# Patient Record
Sex: Female | Born: 1990 | Race: Black or African American | Hispanic: No | Marital: Single | Smoking: Former smoker
Health system: Southern US, Community
[De-identification: ages and names within clinical notes are randomized; demographics above are authoritative.]

## PROBLEM LIST (undated history)

## (undated) ENCOUNTER — Inpatient Hospital Stay (HOSPITAL_COMMUNITY): Payer: Self-pay

## (undated) DIAGNOSIS — F329 Major depressive disorder, single episode, unspecified: Secondary | ICD-10-CM

## (undated) DIAGNOSIS — O24419 Gestational diabetes mellitus in pregnancy, unspecified control: Secondary | ICD-10-CM

## (undated) DIAGNOSIS — D219 Benign neoplasm of connective and other soft tissue, unspecified: Secondary | ICD-10-CM

## (undated) DIAGNOSIS — F32A Depression, unspecified: Secondary | ICD-10-CM

## (undated) DIAGNOSIS — L0291 Cutaneous abscess, unspecified: Secondary | ICD-10-CM

## (undated) DIAGNOSIS — D509 Iron deficiency anemia, unspecified: Secondary | ICD-10-CM

## (undated) DIAGNOSIS — Z62819 Personal history of unspecified abuse in childhood: Secondary | ICD-10-CM

## (undated) DIAGNOSIS — L732 Hidradenitis suppurativa: Secondary | ICD-10-CM

## (undated) DIAGNOSIS — O139 Gestational [pregnancy-induced] hypertension without significant proteinuria, unspecified trimester: Secondary | ICD-10-CM

## (undated) DIAGNOSIS — E669 Obesity, unspecified: Secondary | ICD-10-CM

## (undated) HISTORY — DX: Personal history of unspecified abuse in childhood: Z62.819

## (undated) HISTORY — DX: Gestational diabetes mellitus in pregnancy, unspecified control: O24.419

## (undated) HISTORY — PX: THERAPEUTIC ABORTION: SHX798

## (undated) HISTORY — DX: Iron deficiency anemia, unspecified: D50.9

## (undated) HISTORY — DX: Depression, unspecified: F32.A

## (undated) HISTORY — DX: Obesity, unspecified: E66.9

## (undated) HISTORY — DX: Hidradenitis suppurativa: L73.2

## (undated) HISTORY — PX: INCISE AND DRAIN ABCESS: PRO64

---
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
## 2009-01-19 ENCOUNTER — Encounter: Payer: Self-pay | Admitting: *Deleted

## 2009-01-22 ENCOUNTER — Ambulatory Visit: Payer: Self-pay | Admitting: Family Medicine

## 2009-02-01 ENCOUNTER — Ambulatory Visit: Payer: Self-pay | Admitting: Family Medicine

## 2009-02-01 ENCOUNTER — Other Ambulatory Visit: Admission: RE | Admit: 2009-02-01 | Discharge: 2009-02-01 | Payer: Self-pay | Admitting: Family Medicine

## 2009-02-01 ENCOUNTER — Encounter: Payer: Self-pay | Admitting: Family Medicine

## 2009-02-06 ENCOUNTER — Encounter: Payer: Self-pay | Admitting: Family Medicine

## 2009-02-06 ENCOUNTER — Telehealth: Payer: Self-pay | Admitting: Family Medicine

## 2009-02-28 ENCOUNTER — Encounter: Payer: Self-pay | Admitting: Family Medicine

## 2009-03-01 ENCOUNTER — Telehealth: Payer: Self-pay | Admitting: Family Medicine

## 2009-03-06 ENCOUNTER — Emergency Department (HOSPITAL_COMMUNITY): Admission: EM | Admit: 2009-03-06 | Discharge: 2009-03-06 | Payer: Self-pay | Admitting: Emergency Medicine

## 2009-03-12 ENCOUNTER — Encounter: Payer: Self-pay | Admitting: Family Medicine

## 2009-03-21 ENCOUNTER — Ambulatory Visit: Payer: Self-pay | Admitting: Family Medicine

## 2009-03-29 ENCOUNTER — Telehealth: Payer: Self-pay | Admitting: Family Medicine

## 2009-04-04 ENCOUNTER — Encounter: Payer: Self-pay | Admitting: Family Medicine

## 2009-04-23 ENCOUNTER — Ambulatory Visit: Payer: Self-pay | Admitting: Family Medicine

## 2009-04-23 LAB — CONVERTED CEMR LAB: Beta hcg, urine, semiquantitative: NEGATIVE

## 2009-05-09 ENCOUNTER — Ambulatory Visit: Payer: Self-pay | Admitting: Family Medicine

## 2009-05-16 ENCOUNTER — Ambulatory Visit: Payer: Self-pay | Admitting: Family Medicine

## 2009-05-18 ENCOUNTER — Ambulatory Visit: Payer: Self-pay | Admitting: Family Medicine

## 2009-07-02 ENCOUNTER — Telehealth: Payer: Self-pay | Admitting: Family Medicine

## 2009-07-23 ENCOUNTER — Ambulatory Visit: Payer: Self-pay | Admitting: Family Medicine

## 2009-07-23 DIAGNOSIS — R87610 Atypical squamous cells of undetermined significance on cytologic smear of cervix (ASC-US): Secondary | ICD-10-CM | POA: Insufficient documentation

## 2009-07-23 LAB — CONVERTED CEMR LAB: Hemoglobin: 11.4 g/dL

## 2009-08-29 ENCOUNTER — Encounter: Payer: Self-pay | Admitting: Family Medicine

## 2009-08-29 ENCOUNTER — Ambulatory Visit: Payer: Self-pay | Admitting: Family Medicine

## 2009-08-29 LAB — CONVERTED CEMR LAB: Beta hcg, urine, semiquantitative: NEGATIVE

## 2009-11-05 ENCOUNTER — Telehealth (INDEPENDENT_AMBULATORY_CARE_PROVIDER_SITE_OTHER): Payer: Self-pay | Admitting: *Deleted

## 2009-11-21 ENCOUNTER — Encounter: Payer: Self-pay | Admitting: Family Medicine

## 2009-12-19 ENCOUNTER — Encounter: Payer: Self-pay | Admitting: *Deleted

## 2009-12-19 ENCOUNTER — Ambulatory Visit: Payer: Self-pay | Admitting: Family Medicine

## 2009-12-19 LAB — CONVERTED CEMR LAB: Beta hcg, urine, semiquantitative: NEGATIVE

## 2010-02-14 NOTE — Letter (Signed)
Summary: 1st No Show while on Suspension Letter  [facility]  [address]   [place], [place] [address]   Phone: [phone]  Fax: [phone]    03/12/2009  LEKEYA ROLLINGS [address] [place], [place]  [address]  Dear Ms. Bourque,  You have missed another appointment with our office.  As is our policy if you miss one more  appointment in the next year you will be dismissed from our practice.  We hope this does not happen.  Our office staff can be reached at [phone] Monday through Friday from 8:30 a.m.-5:00 p.m. and will be glad to schedule your appointment as necessary.  Sincerely,   [facility]     Appended Document: 1st No Show while on Suspension Letter mailed certified.  Appended Document: 1st No Show while on Suspension Letter read letter to pt - stated that if she misses one more that she would be dismissed has an exam today and cannot miss it - spoke with KF and she said we could resch since she just found out about exam.  reiterated the letter and made appt for 4/11

## 2010-02-14 NOTE — Assessment & Plan Note (Signed)
Summary: tb test & flu shot,df  Nurse Visit   Vital Signs:  Patient profile:   20 year old female Temp:     67 degrees F  Vitals Entered By: Theresia Lo RN (May 09, 2009 10:34 AM)  Allergies: No Known Drug Allergies  Immunizations Administered:  Influenza Vaccine # 1:    Vaccine Type: Fluvax Non-MCR    Site: right deltoid    Mfr: Sanofi Pasteur    Dose: 0.5 ml    Route: IM    Given by: Theresia Lo RN    Exp. Date: 07/12/2009    Lot #: [id number]    VIS given: 08/06/06 version given May 09, 2009.  Flu Vaccine Consent Questions:    Do you have a history of severe allergic reactions to this vaccine? no    Any prior history of allergic reactions to egg and/or gelatin? no    Do you have a sensitivity to the preservative Thimersol? no    Do you have a past history of Guillan-Barre Syndrome? no    Do you currently have an acute febrile illness? no    Have you ever had a severe reaction to latex? no    Vaccine information given and explained to patient? yes    Are you currently pregnant? no  Orders Added: 1)  Influenza Vaccine NON MCR [00028] 2)  Admin 1st Vaccine [facility]) [90471S]     Appended Document: tb test & flu shot,df   PPD Application    Vaccine Type: PPD    Site: left forearm    Mfr: Sanofi Pasteur    Dose: 0.1 ml    Route: ID    Given by: Theresia Lo RN    Exp. Date: 10/25/2009    Lot #: [id number] patient did not return for PPD reading after applied on 04/23/2009. reapplied today.  Theresia Lo RN  May 09, 2009 10:40 AM

## 2010-02-14 NOTE — Progress Notes (Signed)
  Phone Note Outgoing Call   Call placed by: Angeline Slim MD,  February 06, 2009 9:21 AM Call placed to: Patient Summary of Call: Precepted with Dr Swaziland.  Want to discuss result of pap.  Tried calling pt but phone disconnected with no new phone #.  Tried looking back in chart for any other numbers but this the only number I've ever used to call pt.  Will send letter.   Initial call taken by: Lonnie Rosado MD,  February 06, 2009 9:24 AM

## 2010-02-14 NOTE — Progress Notes (Signed)
Summary: triage  Phone Note Call from Patient Call back at [phone]   Caller: Patient Summary of Call: Pt wondering if she has to be seen to get the morning after pill? Initial call taken by: Clydell Hakim,  March 29, 2009 11:39 AM  Follow-up for Phone Call        lm that she can buy it at drugstore. to call back if any questions Follow-up by: Golden Circle RN,  March 29, 2009 11:54 AM

## 2010-02-14 NOTE — Assessment & Plan Note (Signed)
Summary: Physical for employment   Vital Signs:  Patient profile:   20 year old female Height:      61.6 inches Weight:      238.06 pounds BMI:     44.27 Temp:     98.3 degrees F oral Pulse rate:   84 / minute BP sitting:   123 / 81  (left arm)  Vitals Entered By: Terese Door (March 21, 2009 4:09 PM)  Nutrition Counseling: Patient's BMI is greater than 25 and therefore counseled on weight management options. CC: Physical for employment Is Patient Diabetic? No Pain Assessment Patient in pain? no       Vision Screening:Left eye w/o correction: 20 / 25 Right Eye w/o correction: 20 / 60 Both eyes w/o correction:  20/ 25        Vision Entered By: Terese Door (March 21, 2009 4:18 PM)  Hearing Screen  20db HL: Left  500 hz: 20db 1000 hz: 20db 2000 hz: 20db 4000 hz: 20db Right  500 hz: 20db 1000 hz: 20db 2000 hz: 20db 4000 hz: 20db   Hearing Testing Entered By: Terese Door (March 21, 2009 4:18 PM)   Primary Care Provider:  Angeline Slim MD  CC:  Physical for employment.  History of Present Illness: 20 y/o G2P2 is here for cpe for her work.   Pt denies any sick symptoms.  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    Menses: once a month.  On Depo, but over due for next shot.  Last sexual intercourse last night.  Needs IUD or Implanon.   Habits & Providers  Alcohol-Tobacco-Diet     Alcohol drinks/day: 0     Tobacco Status: never     Diet Comments: Needs to watch portion     Diet Counseling: Nutrition referral  Exercise-Depression-Behavior     Does Patient Exercise: no     Exercise Counseling: to improve exercise regimen     Have you felt down or hopeless? no     Have you felt little pleasure in things? no     Depression Counseling: not indicated; screening negative for depression     STD Risk: past     STD Risk  Counseling: to avoid increased STD risk     Drug Use: past     Drug Use Counseling: pt stopped smoking THC in 2007 when she became pregnant     Seat Belt Use: always     Sun Exposure: infrequent  Current Medications (verified): 1)  None  Allergies (verified): No Known Drug Allergies  Past History:  Family History: Last updated: 03/21/2009 -Brother with eczema -Mother with migraines, early onset DJD, HTN, heart D, heart murmur -Father murdered 6/01--very painful subject for pt,, has a lot of anger but not willing to talk to  Social History: Last updated: 03/21/2009 Father murdered 6/01 Admits to marijuana use in past (last use April 2007).  Has two daughters, J'serie  Corine Shelter born 05/11/07.  Daughter Silas Flood born 10/15/08. Lives with boyfriend Pilar Jarvis of her daughters). +Tobacco on and off.  Quit in 01/2009.  Used to smoke 1 pack/3-4 days.  Started smoking age 80. No alcohol.  No drugs.  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   Risk Factors: Alcohol Use: 0 (03/21/2009) Diet: Needs  to watch portion (03/21/2009) Exercise: no (03/21/2009)  Risk Factors: Smoking Status: never (03/21/2009) Packs/Day: n/a (06/01/2008)  Past Medical History: Reviewed history from 02/01/2009 and no changes required. 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 -Brother with eczema -Mother with migraines, early onset DJD, HTN, heart D, heart murmur -Father murdered 6/01--very painful subject for pt,, has a lot of anger but not willing to talk to  Social History: Father murdered 6/01 Admits to marijuana  use in past (last use April 2007).  Has two daughters, J'serie  Corine Shelter born 05/11/07.  Daughter Silas Flood born 10/15/08. Lives with boyfriend Pilar Jarvis of her daughters). +Tobacco on and off.  Quit in 01/2009.  Used to smoke 1 pack/3-4 days.  Started smoking age 59. No alcohol.  No drugs.  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 Smoking Status:  never STD Risk:  past Drug Use:  past Seat Belt Use:  always Sun Exposure-Excessive:  infrequent Does Patient Exercise:  no  Review of Systems  The patient denies anorexia, fever, weight loss, vision loss, decreased hearing, chest pain, syncope, prolonged cough, headaches, hemoptysis, abdominal pain, melena, severe indigestion/heartburn, hematuria, incontinence, suspicious skin lesions, difficulty walking, and depression.    Physical Exam  General:  Well-developed,well-nourished,in no acute distress; alert,appropriate and cooperative throughout examination Head:  Normocephalic and atraumatic without obvious abnormalities. No apparent alopecia or balding. Eyes:  No corneal or conjunctival inflammation noted. EOMI. Perrla. Funduscopic exam benign, without hemorrhages, exudates or papilledema. Vision grossly normal. Mouth:  Oral mucosa and oropharynx without lesions or exudates.  Teeth in good repair. Neck:  supple, full ROM, and no masses.   Lungs:  Normal respiratory effort, chest expands symmetrically. Lungs are clear to auscultation, no crackles or wheezes. Heart:  Normal rate and regular rhythm. S1 and S2 normal without gallop, murmur, click, rub or other extra sounds. Abdomen:  Bowel sounds positive,abdomen soft and non-tender without masses, organomegaly or hernias noted. obese. Msk:  No deformity or scoliosis noted of thoracic or lumbar spine.   Pulses:  R and L radial,dorsalis pedispulses are full and equal bilaterally Extremities:  No clubbing, cyanosis, edema, or deformity noted with  normal full range of motion of all joints.   Neurologic:  No cranial nerve deficits noted. Station and gait are normal. Plantar reflexes are down-going bilaterally. DTRs are symmetrical throughout. Sensory, motor and coordinative functions appear intact. Skin:  Intact without suspicious lesions or rashes Cervical Nodes:  No lymphadenopathy noted Psych:  Cognition and judgment appear intact. Alert and cooperative with normal attention span and concentration. No apparent delusions, illusions, hallucinations   Impression & Recommendations:  Problem # 1:  PHYSICAL EXAMINATION (ICD-V70.0) Assessment Unchanged Pt healthy.  Filled out PE form for work place.  PPD placed.  Pt to rct in 48 hrs to have it read.   [phone][facility] Hearing- [facility] (92551) [facility] - Est  18-39 yrs [phone])  Problem # 2:  CONTRACEPTIVE MANAGEMENT (ICD-V25.09) Assessment: Unchanged  Pt received Depo at time of discharge from [facility], 5 months ago.  She did not get another depo shot since she want an IUD.  She needs appt asap for IUD or Implanon.    Orders: [facility] - Est  18-39 yrs ([address])  Problem # 3:  OBESITY, CLASS III (ICD-278.01) Assessment: Unchanged  Referred pt to Nutrition. Pt to make appt with Dr Gerilyn Pilgrim.   Orders: [facility] - Est  18-39 yrs [phone])  Other Orders: Tdap => 47yrs IM (60454) Admin 1st Vaccine (09811) TB Skin Test [phone]) Admin of Any Addtl Vaccine (29562) Hepatitis B Vaccine >18yrs ([address])  Patient Instructions: 1)  Please schedule a follow-up appointment with Dr Janalyn Harder for contraception.  2)  Please schedule a follow-up appointment with Dr Gerilyn Pilgrim for nutrition.    Prevention & Chronic Care Immunizations   Influenza vaccine: given  (03/28/2008)   Influenza vaccine due: 03/28/2009    Tetanus booster: 03/21/2009: Tdap   Tetanus booster due: Not Indicated    Pneumococcal vaccine: Not documented  Other Screening   Pap smear: ATYPICAL SQUAMOUS CELLS OF UNDETERMINED SIGNIFICANCE (ASC-US).   (02/01/2009)   Pap smear due: 08/01/2009   Smoking status: never  (03/21/2009)     Immunizations Administered:  Tetanus Vaccine:    Vaccine Type: Tdap    Site: right deltoid    Mfr: GlaxoSmithKline    Dose: 0.5 ml    Route: IM    Given by: Tessie Fass CMA    Exp. Date: 04/07/2011    Lot #: [id number]    VIS given: 12/01/06 version given March 21, 2009.  PPD Skin Test:    Vaccine Type: PPD    Site: left forearm    Mfr: Sanofi Pasteur    Dose: 0.1 ml    Route: ID    Given by: Tessie Fass CMA    Exp. Date: 06/10/2011    Lot #: [id number]  Hepatitis B Vaccine # 1:    Vaccine Type: HepB Adult    Site: left deltoid    Mfr: GlaxoSmithKline    Dose: 1.0 ml    Route: IM    Given by: Tessie Fass CMA    Exp. Date: 05/10/2010    Lot #: [id number]    VIS given: 07/30/05 version given March 21, 2009.

## 2010-02-14 NOTE — Progress Notes (Signed)
Summary: triage  Phone Note Call from Patient Call back at [phone]   Caller: Patient Summary of Call: Pt wondering what the symptoms of food poisioning are. Initial call taken by: Clydell Hakim,  March 01, 2009 3:27 PM  Follow-up for Phone Call        told her n & V, diarrhea. states he boyfriend has nausea only. he is not a pt here. told her there is a GI virus going around. told her if he felt nad he could see his md or go to UC. she agreed Follow-up by: Golden Circle RN,  March 01, 2009 3:35 PM

## 2010-02-14 NOTE — Assessment & Plan Note (Signed)
Summary: FU/KH   Vital Signs:  Patient profile:   20 year old female Height:      62 inches Weight:      232 pounds BMI:     42.59 Temp:     98.3 degrees F oral Pulse rate:   89 / minute BP sitting:   130 / 87  (right arm) Cuff size:   large  Vitals Entered By: Tessie Fass, CMA CC: pap Is Patient Diabetic? No Pain Assessment Patient in pain? yes     Location: under right arm Intensity: 10   Primary Care Provider:  Luretha Murphy NP  CC:  pap.  History of Present Illness: 20 y/o G2P2 presents for postpartum exam.  Not started menses yet.  Just started seeing spotting recently.  Received Depo injection on day of discharge from [facility].   Pap in 03/2008 showed LSIL.  No previous abnormal pap.  Would like to get IUD at next visit. No vaginal discharge. Denies SI/HI.  Habits & Providers  Alcohol-Tobacco-Diet     Tobacco Status: quit  Current Medications (verified): 1)  Gnp Prenatal Vitamins  Tabs (Prenatal Vit-Fe Fumarate-Fa) .... 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  Has a daughter, Nevada Crane born 05/11/07.  Daughter Ladene Artist born 10/15/08 Lives with boyfriend (father of her daughters). Works at call center.Smoking Status:  quit  Physical Exam  General:  Well-developed,well-nourished,in no acute distress; alert,appropriate and cooperative throughout  examination. vitals reviewed.    Pelvic Exam  Vulva:      normal appearance, normal hair distribution, no lesions or masses.   Vagina:      normal, ruggated, physiologic discharge, no lesions, no masses, no cystocele, adequate pelvic support.   Cervix:      normal, midposition, no CMT, no lesions.   Uterus:      smooth, anteverted, anteflexed, mobile, non-tender, firm, adequate support, no prolapse.   Adnexa:      normal, no masses bilaterally, mobile bilaterally, nontender bilaterally.      Impression & Recommendations:  Problem # 1:  SCREENING FOR MALIGNANT NEOPLASM OF THE CERVIX (ICD-V76.2) Assessment New Pap in 03/2008 with LSIL, no previous history of abnormal pap.  If another abnormal result will need culpo.   Orders: Pap Smear-[facility] ([id number])  Problem # 2:  AMENORRHEA (ICD-626.0) Assessment: Unchanged No periods since delivery.  UPT negative today.  Pt to rtc in one week for IUD. Orders: U Preg-[facility] (56213)  Complete Medication List: 1)  Gnp Prenatal Vitamins Tabs (Prenatal vit-fe fumarate-fa) .... One tablet by mouth daily  Other Orders: GC/Chlamydia-[facility] (87591/87491) Postpartum visit- [facility] [phone])  Laboratory Results   Urine Tests  Date/Time Received: February 01, 2009 10:03 AM  Date/Time Reported: February 01, 2009 10:06 AM     Urine HCG: negative Comments: ...........test performed by...........Marland KitchenTerese Door, CMA

## 2010-02-14 NOTE — Assessment & Plan Note (Signed)
Summary: PPD,df  Nurse Visit  Patient has failed to return for PPD reading X 2 . Contacted [facility] and was advised by TB nurse that it will be ok to administer a third now. Consulted Dr. Perley Jain and he agrees. Patient states each time she saw no reaction to PPD. Encouraged patient to return on Friday as needed and explained importance of this. She agrees she will be able to do.  Theresia Lo RN  May 16, 2009 10:56 AM   Allergies: No Known Drug Allergies  Immunizations Administered:  PPD Skin Test:    Vaccine Type: PPD    Site: right forearm    Mfr: Sanofi Pasteur    Dose: 0.1 ml    Route: ID    Given by: Theresia Lo RN    Exp. Date: 10/26/2010    Lot #: [id number]  Orders Added: 1)  TB Skin Test [86580] 2)  Admin 1st Vaccine [phone]

## 2010-02-14 NOTE — Miscellaneous (Signed)
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

## 2010-02-14 NOTE — Miscellaneous (Signed)
Summary: Consent Implanon Insertion  Consent Implanon Removal   Imported By: Clydell Hakim 08/31/2009 14:59:11  _____________________________________________________________________  External Attachment:    Type:   Image     Comment:   External Document

## 2010-02-14 NOTE — Letter (Signed)
Summary: Generic Letter: ASCUS pap  [facility]  [address]   [place], [place] [address]   Phone: [phone]  Fax: [phone]    02/06/2009  [facility] [address] [place], [place]  [address]  Dear 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  This is a similar result to the LSIL seen on your Pap Test in 03/2008.  Because of your age, you are at low risk of cervical cancer. We can repeat the Pap Test again in 6 months.  At that time if the result is still ASCUS or LSIL we can schedule you for a procedure called Culposcopy, which is an examination under special lighting.  Please call the [facility] to discuss these options further or if you have any concerns.    Sincerely,   [name] MD  Appended Document: Generic Letter: ASCUS pap letter sent certified mail.  Appended Document: Generic Letter: ASCUS pap letter returned  Appended Document: Generic Letter: ASCUS pap Precepted with Dr Swaziland.  Certified letter regarding ASCUS pap was returned to [facility].  Pt will need to have repeat pap on 08/01/09, 6 mos from previous pap.

## 2010-02-14 NOTE — Miscellaneous (Signed)
Summary: order for 03/21/09 visit  Clinical Lists Changes  Problems: Added new problem of WELL CHILD EXAMINATION (ICD-V20.2) Orders: Added new Test order of [facility] - Est  18-39 yrs [phone]) - Signed

## 2010-02-14 NOTE — Assessment & Plan Note (Signed)
Summary: BIRTH CONTROL IMPLANT/[facility]  Patient decided to get depo until implanon comes in, only charged for a nurse visit...............................................Marland KitchenGaren Grams LPN April 23, 2009 11:02 AM   Vital Signs:  Patient profile:   20 year old female Height:      61.6 inches Weight:      242.9 pounds BMI:     45.17 Temp:     98.4 degrees F oral Pulse rate:   83 / minute BP sitting:   112 / 69  (right arm) Cuff size:   regular  Vitals Entered By: Garen Grams LPN (April 23, 2009 9:51 AM) CC: discuss birth control options Is Patient Diabetic? No Pain Assessment Patient in pain? no        Primary Care Provider:  Cat Ta MD  CC:  discuss birth control options.  History of Present Illness: Last sexual intercourse 2 wks ago  Habits & Providers  Alcohol-Tobacco-Diet     Tobacco Status: never  Allergies: No Known Drug Allergies   Other Orders: U Preg-[facility] (96295) TB Skin Test [phone]) Admin 1st Vaccine (24401) Admin 1st Vaccine [facility] [phone]) Depo-Provera 150mg  (J1055) Est Level 1- [facility] (66440)   PPD Application    Vaccine Type: PPD    Site: right forearm    Mfr: Sanofi Pasteur    Dose: 0.1 ml    Route: ID    Given by: Garen Grams LPN    Exp. Date: 12/26/2010    Lot #: [id number]    Medication Administration  Injection # 1:    Medication: Depo-Provera 150mg     Diagnosis: CONTRACEPTIVE MANAGEMENT (ICD-V25.09)    Route: IM    Site: L deltoid    Exp Date: 11/14/2011    Lot #: [id number]    Mfr: Francisca December    Comments: Next Depo Due: June 27 - July 11    Patient tolerated injection without complications    Given by: Garen Grams LPN (April 23, 2009 10:13 AM)  Orders Added: 1)  U Preg-[facility] [81025] 2)  TB Skin Test [86580] 3)  Admin 1st Vaccine [90471] 4)  Admin 1st Vaccine [facility]) [90471S] 5)  Depo-Provera 150mg  [J1055] 6)  Est Level 1- [facility] [64332]   Laboratory Results   Urine Tests  Date/Time Received: April 23, 2009 9:41 AM  Date/Time  Reported: April 23, 2009 10:15 AM     Urine HCG: negative Comments: ...............test performed by......Marland KitchenBonnie A. Swaziland, MLS (ASCP)cm

## 2010-02-14 NOTE — Letter (Signed)
Summary: Out of Work  [facility]  [address]   [place], [place] [address]   Phone: [phone]  Fax: [phone]    December 19, 2009   Employee:  SHALLYN CONSTANCIO    To Whom It May Concern:   For Medical reasons, please excuse the above named employee from work for the following dates:  Start:   12-18-09  End:   12-20-09  If you need additional information, please feel free to contact our office.         Sincerely,    Arlyss Repress CMA,

## 2010-02-14 NOTE — Assessment & Plan Note (Signed)
Summary: PP/KH   Vital Signs:  Patient profile:   20 year old female Height:      62 inches Weight:      230 pounds BMI:     42.22 Temp:     97.9 degrees F oral Pulse rate:   91 / minute BP sitting:   128 / 82  (left arm) Cuff size:   large  Vitals Entered By: Tessie Fass CMA (January 22, 2009 2:32 PM) CC: postpartum check Is Patient Diabetic? No   Primary Care Provider:  Luretha Murphy NP  CC:  postpartum check.  History of Present Illness: Pt has appt with [facility] office today and she is concerned she will miss appt.  She would like to reschedule post partum appt with me.    NO POST PARTUM EXAM DONE TODAY  Allergies: No Known Drug Allergies   Complete Medication List: 1)  Ferrous Sulfate 325 (65 Fe) Mg Tbec (Ferrous sulfate) .... One two times a day 2)  Ob Complete 50-1.25 Mg Tabs (Prenatal vit-iron carbonyl-fa) .... Or similar prenatal vit, one daily 3)  Unisom 25 Mg Tabs (Doxylamine succinate (sleep)) .... One-half tablet by mouth three times a day to four times a day.  if this makes you sleepy, do not take it during the day. 4)  Gnp Prenatal Vitamins Tabs (Prenatal vit-fe fumarate-fa) .... One tablet by mouth daily 5)  Pyridoxine Hcl 25 Mg Tabs (Pyridoxine hcl) .... One tablet by mouth every 6 hour for nausea and vomiting  Appended Document: Orders Update    Clinical Lists Changes  Orders: Added new Service order of No Charge Patient Arrived (NCPA0) (NCPA0) - Signed

## 2010-02-14 NOTE — Miscellaneous (Signed)
Summary: pt r/s P[facility] with Dr.Ta/ts  Clinical Lists Changes

## 2010-02-14 NOTE — Assessment & Plan Note (Signed)
Summary: implanon,df   Vital Signs:  Patient profile:   20 year old female Height:      61.6 inches Weight:      242 pounds BMI:     45.00 Temp:     98.8 degrees F oral Pulse rate:   83 / minute BP sitting:   118 / 80  (left arm) Cuff size:   large  Vitals Entered By: Tessie Fass CMA (August 29, 2009 3:00 PM) CC: implanon insertion   Primary Care Amayah Staheli:  Cat Ta MD  CC:  implanon insertion.  History of Present Illness: 20 y/o G2p2 F is here for implanon insertion. Pt is currently on Depo, last injection May.  She cannot remember LMP as her menses stopped with Depo. UPT negative today.  Patient given informed consent for Implanon insertion. She has no questions. Signed copy in chart. Patient placed in supine position, with left arm (nondominant arm) placed comfortably under her head.  Measurement with ruler provided by Implanon made from alecrenon.  Area clensed with betadine and alcohol.  Injection of 4cc lidocaine with 1 1/2 inche 25 gauge needle. Implanon was advanced in steril fashion.  Very small amount of blood oozing from this site. Compression wrap was done after injection.  Pt was instructed to keep compression wrap on for 24 hrs. Post procedure instructions given.     Current Medications (verified): 1)  None  Allergies (verified): No Known Drug Allergies  Physical Exam  General:  Well-developed,well-nourished,in no acute distress; alert,appropriate and cooperative throughout examination. vitals reviewd Msk:  Implanon inserted in Left arm.    Impression & Recommendations:  Problem # 1:  INSERTION OF IMPLANTABLE SUBDERMAL CONTRACEPTIVE (ICD-V25.5) Assessment New UPT negative.  Implanon inserted in left arm.  card given to pt for safe keeping.   Orders: Insertion, non biodegradable drug deliver implant, (30160)  Insertion implantable contraceptive capsules   (10932) Etonogestrel implant system, including implant and supplie (T5573)  Other Orders: U  Preg-[facility] (22025)  Laboratory Results   Urine Tests  Date/Time Received: August 29, 2009 3:09 PM  Date/Time Reported: August 29, 2009 3:17 PM     Urine HCG: negative Comments: ............test performed by...........Marland Kitchen Terese Door, CMA .............entered by...........Marland KitchenBonnie A. [place]az[place], MLS (ASCP)cm

## 2010-02-14 NOTE — Assessment & Plan Note (Signed)
Summary: vomiting, diarrhea   Vital Signs:  Patient profile:   20 year old female Weight:      240.2 pounds Temp:     98.5 degrees F oral Pulse rate:   80 / minute BP sitting:   120 / 70  Vitals Entered By: Renato Battles slade,cma CC: vomiting 2 days Is Patient Diabetic? No Pain Assessment Patient in pain? no        Primary Care Provider:  Cat Ta MD  CC:  vomiting 2 days.  History of Present Illness: 20 y/o F is here for vomiting and diarrhea  x 2 days  Pt works as a Clinical biochemist at [employer].  Vomiting started yesterday at work, Nonbloody.  She tried to eat, then had another vomiting episode.  She was sent home from work and over the course of last night everytime she ate, she would vomit.  She also is having abd pain.  Tried eating cereal this morning, but vomited again.  Total 6 emesis yesterday and 1 today. +Diarrhea x 3 yesterday, nonbloody. No antibiotic use recently.   No fever, chills.    Birth control: Implanon 08/2009  Habits & Providers  Alcohol-Tobacco-Diet     Tobacco Status: current     Tobacco Counseling: to quit use of tobacco products  Current Medications (verified): 1)  Implanon 68 Mg Impl (Etonogestrel) .... Inserted 08/29/09  Allergies (verified): No Known Drug Allergies  Social History: Smoking Status:  current  Review of Systems General:  Denies chills, fever, and malaise. GI:  Complains of abdominal pain, diarrhea, and vomiting; denies bloody stools and vomiting blood.  Physical Exam  General:  Well-developed,well-nourished,in no acute distress; alert,appropriate and cooperative throughout examination Mouth:  mildly dry mucous membrane Lungs:  Normal respiratory effort, chest expands symmetrically. Lungs are clear to auscultation, no crackles or wheezes. Heart:  Normal rate and regular rhythm. S1 and S2 normal without gallop, murmur, click, rub or other extra sounds. Abdomen:  Bowel sounds positive,abdomen soft and non-tender without masses,  organomegaly or hernias noted. Cervical Nodes:  No lymphadenopathy noted Inguinal Nodes:  No significant adenopathy   Impression & Recommendations:  Problem # 1:  GASTROENTERITIS, ACUTE (ICD-558.9) Assessment New Pt has Implanon for birth control.  UPT negative.  Vomiting and diarrhea likely from viral source.  Advised that she needs to keep up her fluid status.  Advised broth (which she likes) and gatorade and water.  Will give Zofran odt and Phenergan suppository. Advised keeping survelleance over hygiene as pt has 2 children at home.  Pt to rtc next week if not better.    Her updated medication list for this problem includes:    Zofran Odt 8 Mg Tbdp (Ondansetron) .Marland Kitchen... 1 tab by mouth every 6 hours as needed nausea and vomiting  Orders: [facility]- Est Level  3 (99213)  Complete Medication List: 1)  Implanon 68 Mg Impl (Etonogestrel) .... Inserted 08/29/09 2)  Zofran Odt 8 Mg Tbdp (Ondansetron) .Marland Kitchen.. 1 tab by mouth every 6 hours as needed nausea and vomiting 3)  Promethazine Hcl 12.5 Mg Supp (Promethazine hcl) .Marland Kitchen.. 1 per rectum every 6 hours as needed nausea and vomiting  Other Orders: U Preg-[facility] (16109)  Patient Instructions: 1)  Follow up next week if not better.   2)  Oral rehydration solution:Try to drink half a cup of water every hour.  Contact the office if unable to tolerate oral solution, if you keep vomiting, or you continue to have signs of dehydration. 3)  Drink broth, gatorade,  gingerale. 4)  Keep hygiene survellance.  Spray lysol on surfaces that you touch.   Prescriptions: PROMETHAZINE HCL 12.5 MG SUPP (PROMETHAZINE HCL) 1 per rectum every 6 hours as needed nausea and vomiting  #30 x 1   Entered and Authorized by:   Angeline Slim MD   Signed by:   Angeline Slim MD on 12/19/2009   Method used:   Electronically to        [facility]* (retail)       [address]       [address]/mail[address]       [place], [place]  [address]       Ph: [phone]       Fax:  [phone]   RxID:   [id number] ZOFRAN ODT 8 MG TBDP (ONDANSETRON) 1 tab by mouth every 6 hours as needed nausea and vomiting  #30 x 1   Entered and Authorized by:   Angeline Slim MD   Signed by:   Angeline Slim MD on 12/19/2009   Method used:   Electronically to        [facility]* (retail)       [address]       [address]mail[address]       [place], [place]  [address]       Ph: [phone]       Fax: [phone]   RxID:   [id number]    Orders Added: 1)  U Preg-[facility] [81025] 2)  [facility]- Est Level  3 [73220]    Laboratory Results   Urine Tests  Date/Time Received: December 19, 2009 10:47 AM  Date/Time Reported: December 19, 2009 10:55 AM     Urine HCG: negative Comments: ...............test performed by......Marland KitchenBonnie A. [place], MLS (ASCP)cm

## 2010-02-14 NOTE — Assessment & Plan Note (Signed)
Summary: still bleeding,df   Vital Signs:  Patient profile:   21 year old female Weight:      239 pounds BMI:     44.44 Temp:     98.2 degrees F oral Pulse rate:   94 / minute BP sitting:   117 / 78  (right arm) Cuff size:   large  Vitals Entered By: Jimmy Footman, CMA (July 23, 2009 1:55 PM) CC: excessive vaginal bleeding Is Patient Diabetic? No Pain Assessment Patient in pain? no        Primary Care Provider:  Devontae Casasola MD  CC:  excessive vaginal bleeding.  History of Present Illness: 20 y/o G2P2 here for bleeding since May.  Bleeding since May.  At worse changing pads every 45 minutes.  Sometiems light.  Sometimes with clots.  Menses has been irregular in past, but never has it lasted this long.  She received Depo shot in April.  Her menses has been irregular since.  She has not had sexual intercourse due to bleeding.    Sometimes feels sob, but more related to heat.  No fatigue.  HAs x 2 1/2 weeks.  History of migraine, HAs feels like previous migrainest.  No syncope. no nausea.  no vomiting.  no abd pain.  bowel movements normal.    Current Medications (verified): 1)  Estrace 0.5 Mg Tabs (Estradiol) .Marland Kitchen.. 1 Tab By Mouth Daily For 7-14 Days To Stop Bleeding  Allergies (verified): No Known Drug Allergies  Past History:  Past Medical History: Last updated: 02/01/2009 G2P2 Baby at 17, Baby at 19 KERATOSIS PILARIS (ICD-757.39) HIDRADENITIS SUPPURATIVA (ICD-705.83) ACUTE PERICARDITIS DISEASES CLASSIFIED ELSEWHERE (ICD-420.0) HYPERTENSION, GESTATIONAL (ICD-642.90): followed by [facility] for pregnancy, was taking Labetalol TATTOOING (ICD-709.09)  Past Surgical History: Last updated: 03/21/2009 none  Family History: Last updated: 03/21/2009 -Brother with eczema -Mother with migraines, early onset DJD, HTN, heart D, heart murmur -Father murdered 6/01--very painful subject for pt,, has a lot of anger but not willing to talk to  Social History: Last updated:  03/21/2009 Father murdered 6/01 Admits to marijuana use in past (last use April 2007).  Has two daughters, J'serie  Corine Shelter born 05/11/07.  Daughter Silas Flood born 10/15/08. Lives with boyfriend Pilar Jarvis of her daughters). +Tobacco on and off.  Quit in 01/2009.  Used to smoke 1 pack/3-4 days.  Started smoking age 43. No alcohol.  No drugs.  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   Risk Factors: Alcohol Use: 0 (03/21/2009) Diet: Needs to watch portion (03/21/2009) Exercise: no (03/21/2009)  Risk Factors: Smoking Status: never (04/23/2009) Packs/Day: n/a (06/01/2008)  Review of Systems Heme:  Complains of bleeding; denies abnormal bruising, enlarge lymph nodes, fevers, pallor, and skin discoloration.  Physical Exam  General:  Well-developed,well-nourished,in no acute distress; alert,appropriate and cooperative throughout examination. vitals reviewed.   Genitalia:  Normal introitus for age, no external lesions, no vaginal discharge, mucosa pink and moist, no vaginal or cervical lesions, no vaginal atrophy, no friaility or hemorrhage, normal uterus size and position, no adnexal masses or tenderness   Impression & Recommendations:  Problem # 1:  EXCESSIVE MENSTRUAL BLEEDING (ICD-626.2) Assessment New Hb 11.4.  Excessive bleeding most likely 2/2 depo injection in April.  Pt would like bleeding to stop.  Will give Estrogen (estrace 0.55mg  by mouth daily for 7-14 days).  Pt will come back for longterm contraception planning with Implanon. Orders: Hemoglobin-[facility] (16109) [facility]- Est Level  3 (60454)  Problem # 2:  PAP  SMEAR, ABNORMAL, ASCUS (ICD-795.01) ASCUS Jan 2010.  Needs repeat pap this month.  Will do when no longer bleeding.    Complete Medication List: 1)  Estrace 0.5 Mg Tabs (Estradiol) .Marland Kitchen.. 1 tab by mouth daily for 7-14 days to stop bleeding  Patient Instructions: 1)  Please schedule a follow-up appointment for Implanon.   ASAP.   2)  To stop your bleeding I will prescribe a estrogen pill for you to take daily for 1-2 wks. 3)  .  Prescriptions: ESTRACE 0.5 MG TABS (ESTRADIOL) 1 tab by mouth daily for 7-14 days to stop bleeding  #14 x 0   Entered and Authorized by:   Angeline Slim MD   Signed by:   Angeline Slim MD on 07/23/2009   Method used:   Electronically to        [phone]* (retail)       [address]       [place], [place]  [address]       Ph: [phone]       Fax: [phone]   RxID:   [id number]   Laboratory Results   Blood Tests   Date/Time Received: July 23, 2009 1:48 PM  Date/Time Reported: July 23, 2009 1:53 PM     CBC   HGB:  11.4 g/dL   (Normal Range: 18.8-41.6 in Males, 12.0-15.0 in Females) Comments: ...........test performed by...........Marland KitchenTerese Door, CMA       Prevention & Chronic Care Immunizations   Influenza vaccine: Fluvax Non-MCR  (05/09/2009)   Influenza vaccine due: 03/28/2009    Tetanus booster: 03/21/2009: Tdap   Tetanus booster due: Not Indicated    Pneumococcal vaccine: Not documented  Other Screening   Pap smear: ATYPICAL SQUAMOUS CELLS OF UNDETERMINED SIGNIFICANCE (ASC-US).  (02/01/2009)   Pap smear due: 08/01/2009   Smoking status: never  (04/23/2009)

## 2010-02-14 NOTE — Miscellaneous (Signed)
Summary: Ascus pap, needs repeat 08/01/09  Clinical Lists Changes  Observations: Added new observation of PAP DUE: 08/01/2009 (02/28/2009 11:59) Added new observation of DM PROGRESS: N/A (02/28/2009 11:59) Added new observation of DM FSREVIEW: N/A (02/28/2009 11:59) Added new observation of HTN PROGRESS: N/A (02/28/2009 11:59) Added new observation of HTN FSREVIEW: N/A (02/28/2009 11:59) Added new observation of LIPID PROGRS: N/A (02/28/2009 11:59) Added new observation of LIPID FSREVW: N/A (02/28/2009 11:59)      Prevention & Chronic Care Immunizations   Influenza vaccine: given  (03/28/2008)   Influenza vaccine due: 03/28/2009    Tetanus booster: Not documented   Tetanus booster due: Not Indicated    Pneumococcal vaccine: Not documented  Other Screening   Pap smear: ATYPICAL SQUAMOUS CELLS OF UNDETERMINED SIGNIFICANCE (ASC-US).  (02/01/2009)   Pap smear due: 08/01/2009   Smoking status: quit  (02/01/2009)

## 2010-02-14 NOTE — Assessment & Plan Note (Signed)
Summary: READ PPD/KH  Nurse Visit   Allergies: No Known Drug Allergies  PPD Results    Date of reading: 05/18/2009    Results: 0 mm    Interpretation: negative  Orders Added: 1)  No Charge Patient Arrived (NCPA0) [NCPA0]

## 2010-02-14 NOTE — Progress Notes (Signed)
Summary: Shot Req  Phone Note Call from Patient Call back at [phone]   Caller: Patient Summary of Call: Needs copy of her flu shot and Hep B series. Initial call taken by: Clydell Hakim,  November 05, 2009 10:45 AM  Follow-up for Phone Call         record ready for patient to pick up. patient was advised that she needs to finish Hep B series and needs flu vaccine for this flu season. she has been hired by [employer] and will get the shots thru her employer. Follow-up by: Theresia Lo RN,  November 05, 2009 11:57 AM

## 2010-02-14 NOTE — Progress Notes (Signed)
Summary: triage: [facility] work-in appt  Phone Note Call from Patient Call back at [phone]   Caller: Patient Summary of Call: Having a lot of bleeding for about a month now. Initial call taken by: Clydell Hakim,  July 02, 2009 11:49 AM  Follow-up for Phone Call        bleeding x 1 month. work in at 3:30 with Dr. g today Follow-up by: Golden Circle RN,  July 02, 2009 11:56 AM

## 2010-03-03 ENCOUNTER — Emergency Department (HOSPITAL_COMMUNITY)
Admission: EM | Admit: 2010-03-03 | Discharge: 2010-03-03 | Disposition: A | Discharge status: Home / Self Care | Payer: Medicaid Other | Attending: Emergency Medicine | Admitting: Emergency Medicine

## 2010-03-03 DIAGNOSIS — R5381 Other malaise: Secondary | ICD-10-CM | POA: Insufficient documentation

## 2010-03-03 DIAGNOSIS — R630 Anorexia: Secondary | ICD-10-CM | POA: Insufficient documentation

## 2010-03-03 DIAGNOSIS — J029 Acute pharyngitis, unspecified: Secondary | ICD-10-CM | POA: Insufficient documentation

## 2010-03-03 DIAGNOSIS — IMO0001 Reserved for inherently not codable concepts without codable children: Secondary | ICD-10-CM | POA: Insufficient documentation

## 2010-03-03 DIAGNOSIS — R220 Localized swelling, mass and lump, head: Secondary | ICD-10-CM | POA: Insufficient documentation

## 2010-03-03 DIAGNOSIS — J069 Acute upper respiratory infection, unspecified: Secondary | ICD-10-CM | POA: Insufficient documentation

## 2010-03-03 DIAGNOSIS — I10 Essential (primary) hypertension: Secondary | ICD-10-CM | POA: Insufficient documentation

## 2010-03-03 DIAGNOSIS — R6883 Chills (without fever): Secondary | ICD-10-CM | POA: Insufficient documentation

## 2010-03-03 LAB — RAPID STREP SCREEN (MED CTR MEBANE ONLY): Streptococcus, Group A Screen (Direct): NEGATIVE

## 2010-03-04 ENCOUNTER — Ambulatory Visit (INDEPENDENT_AMBULATORY_CARE_PROVIDER_SITE_OTHER): Payer: Medicaid Other | Admitting: Family Medicine

## 2010-03-04 ENCOUNTER — Encounter: Payer: Self-pay | Admitting: Family Medicine

## 2010-03-04 VITALS — BP 133/84 | HR 101 | Temp 99.0°F | Wt 241.0 lb

## 2010-03-04 DIAGNOSIS — J029 Acute pharyngitis, unspecified: Secondary | ICD-10-CM

## 2010-03-04 NOTE — Progress Notes (Signed)
  Subjective:    Patient ID: Denise Gillespie, female    DOB: 29-Aug-1990, 20 y.o.   MRN: [id number]  HPI Sore throat x 5 days.  No cough.  Not sure about fever, but was [facility] in ER at [facility] for sore throat and had Tm 102 and she was given Tylenol for fever.  Body aches x 3 days: "I'm just sore all over."  Sometimes she has intermittent sharp back pain x2.  +vomiting today at 2pm and vomited one time yesterday. No abd pain.   Diarrhea x 2 days; 2-3x yesterday, 3x today.  Watery and greasy. Last meal was Sat pm (2 nights ago), she feels hungry but throat hurts too much to eat. No rhinorrhea, no nasal congestion, no watery eyes.  Taking Theraflu, Alka Selzer cold, Dayquil, Nitequil, cough drops, Tylenol cold.    Last dose of med (Theraflu) was taken 30 minutes before OV.   Review of Systems Per hpi  Objective:   Physical Exam GEN: NAD, cooperative, appropriate throughout exam HEENT: MMM no pharyngeal exudates, no redness, EOMI.  Skin: feels warm to touch, not clammy, no rash Lungs: CTA b/l, no wheezing, rales, rhonchi CVS: RRR, no murmurs Abd: obese, soft, nontender Ext: no c/c/e        Assessment & Plan:

## 2010-03-04 NOTE — Assessment & Plan Note (Signed)
Pt presents with sore throat x 3 days + fever (T was 102 at [facility] last night).  Rapid Strep negative today and last night at [facility].  Pt also has myalgias x 3 days.  Symptoms likely influenza but since it has been > 48 hrs, will not give Tamiflu.  Pt has been taking multiple otc meds (Theraflu, Nitequil, Dayquil, etc.).  Advised symptomatic treatment (tylenol, ibuprofen) and lots of fluids and rest.  Also advised chloraseptic spray for throat pain.  Pt to rtc if not better in one week. Pt agreeable to plan.

## 2010-03-19 ENCOUNTER — Emergency Department (HOSPITAL_COMMUNITY)
Admission: EM | Admit: 2010-03-19 | Discharge: 2010-03-19 | Disposition: A | Discharge status: Home / Self Care | Payer: Medicaid Other | Attending: Emergency Medicine | Admitting: Emergency Medicine

## 2010-03-19 ENCOUNTER — Emergency Department (HOSPITAL_COMMUNITY): Payer: Medicaid Other

## 2010-03-19 DIAGNOSIS — R0602 Shortness of breath: Secondary | ICD-10-CM | POA: Insufficient documentation

## 2010-03-19 DIAGNOSIS — R062 Wheezing: Secondary | ICD-10-CM | POA: Insufficient documentation

## 2010-03-19 DIAGNOSIS — R0989 Other specified symptoms and signs involving the circulatory and respiratory systems: Secondary | ICD-10-CM | POA: Insufficient documentation

## 2010-03-19 DIAGNOSIS — R0609 Other forms of dyspnea: Secondary | ICD-10-CM | POA: Insufficient documentation

## 2010-04-18 LAB — CBC
MCHC: 33.5 g/dL (ref 30.0–36.0)
MCV: 82.8 fL (ref 78.0–100.0)
MCV: 83.5 fL (ref 78.0–100.0)
Platelets: 224 10*3/uL (ref 150–400)
RBC: 3.28 MIL/uL — ABNORMAL LOW (ref 3.87–5.11)
RBC: 3.78 MIL/uL — ABNORMAL LOW (ref 3.87–5.11)
RDW: 16.3 % — ABNORMAL HIGH (ref 11.5–15.5)
WBC: 16.5 10*3/uL — ABNORMAL HIGH (ref 4.0–10.5)

## 2010-04-18 LAB — RPR: RPR Ser Ql: NONREACTIVE

## 2010-04-19 LAB — POCT URINALYSIS DIP (DEVICE)
Bilirubin Urine: NEGATIVE
Glucose, UA: NEGATIVE mg/dL
Glucose, UA: NEGATIVE mg/dL
Hgb urine dipstick: NEGATIVE
Hgb urine dipstick: NEGATIVE
Hgb urine dipstick: NEGATIVE
Ketones, ur: NEGATIVE mg/dL
Ketones, ur: NEGATIVE mg/dL
Nitrite: NEGATIVE
Nitrite: NEGATIVE
Protein, ur: NEGATIVE mg/dL
Protein, ur: 30 mg/dL — AB
Protein, ur: NEGATIVE mg/dL
Specific Gravity, Urine: 1.025 (ref 1.005–1.030)
Specific Gravity, Urine: 1.025 (ref 1.005–1.030)
Specific Gravity, Urine: 1.025 (ref 1.005–1.030)
Urobilinogen, UA: 0.2 mg/dL (ref 0.0–1.0)
Urobilinogen, UA: 1 mg/dL (ref 0.0–1.0)
pH: 6 (ref 5.0–8.0)
pH: 6 (ref 5.0–8.0)
pH: 7 (ref 5.0–8.0)

## 2010-04-19 LAB — CBC
HCT: 26.5 % — ABNORMAL LOW (ref 36.0–46.0)
Hemoglobin: 8.8 g/dL — ABNORMAL LOW (ref 12.0–15.0)
MCHC: 33.4 g/dL (ref 30.0–36.0)
RBC: 3.16 MIL/uL — ABNORMAL LOW (ref 3.87–5.11)
RDW: 15.2 % (ref 11.5–15.5)

## 2010-04-20 LAB — URINE MICROSCOPIC-ADD ON

## 2010-04-20 LAB — URINALYSIS, ROUTINE W REFLEX MICROSCOPIC
Bilirubin Urine: NEGATIVE
Glucose, UA: 100 mg/dL — AB
Hgb urine dipstick: NEGATIVE
Ketones, ur: 15 mg/dL — AB
Nitrite: NEGATIVE
Specific Gravity, Urine: >1.03 — ABNORMAL HIGH (ref 1.005–1.030)
pH: 6 (ref 5.0–8.0)

## 2010-04-20 LAB — DIFFERENTIAL
Lymphocytes Relative: 14 % (ref 12–46)
Lymphs Abs: 2.5 10*3/uL (ref 0.7–4.0)
Monocytes Relative: 7 % (ref 3–12)
Neutrophils Relative %: 78 % — ABNORMAL HIGH (ref 43–77)

## 2010-04-20 LAB — POCT URINALYSIS DIP (DEVICE)
Bilirubin Urine: NEGATIVE
Bilirubin Urine: NEGATIVE
Glucose, UA: NEGATIVE mg/dL
Glucose, UA: NEGATIVE mg/dL
Hgb urine dipstick: NEGATIVE
Hgb urine dipstick: NEGATIVE
Hgb urine dipstick: NEGATIVE
Ketones, ur: NEGATIVE mg/dL
Nitrite: NEGATIVE
Protein, ur: 30 mg/dL — AB
Specific Gravity, Urine: 1.025 (ref 1.005–1.030)
Specific Gravity, Urine: 1.02 (ref 1.005–1.030)
Specific Gravity, Urine: 1.02 (ref 1.005–1.030)
Urobilinogen, UA: 1 mg/dL (ref 0.0–1.0)
pH: 6.5 (ref 5.0–8.0)

## 2010-04-20 LAB — STREP B DNA PROBE: Strep Group B Ag: POSITIVE

## 2010-04-20 LAB — COMPREHENSIVE METABOLIC PANEL
ALT: 12 U/L (ref 0–35)
AST: 21 U/L (ref 0–37)
Albumin: 3.1 g/dL — ABNORMAL LOW (ref 3.5–5.2)
CO2: 25 mEq/L (ref 19–32)
Calcium: 8.3 mg/dL — ABNORMAL LOW (ref 8.4–10.5)
Creatinine, Ser: 0.61 mg/dL (ref 0.4–1.2)
GFR calc Af Amer: >60 mL/min (ref >60)
GFR calc non Af Amer: >60 mL/min (ref >60)
Sodium: 134 mEq/L — ABNORMAL LOW (ref 135–145)
Total Protein: 6.3 g/dL (ref 6.0–8.3)

## 2010-04-20 LAB — CBC
HCT: 28.3 % — ABNORMAL LOW (ref 36.0–46.0)
MCHC: 33.2 g/dL (ref 30.0–36.0)
MCV: 84.1 fL (ref 78.0–100.0)
Platelets: 218 10*3/uL (ref 150–400)
Platelets: 242 10*3/uL (ref 150–400)
RBC: 3.35 MIL/uL — ABNORMAL LOW (ref 3.87–5.11)
RBC: 3.72 MIL/uL — ABNORMAL LOW (ref 3.87–5.11)
WBC: 17.6 10*3/uL — ABNORMAL HIGH (ref 4.0–10.5)

## 2010-04-20 LAB — CULTURE, BLOOD (ROUTINE X 2)

## 2010-04-20 LAB — URINE CULTURE

## 2010-04-20 LAB — GC/CHLAMYDIA PROBE AMP, GENITAL
Chlamydia, DNA Probe: NEGATIVE
GC Probe Amp, Genital: NEGATIVE

## 2010-04-21 LAB — POCT URINALYSIS DIP (DEVICE)
Bilirubin Urine: NEGATIVE
Glucose, UA: 100 mg/dL — AB
Hgb urine dipstick: NEGATIVE
Hgb urine dipstick: NEGATIVE
Hgb urine dipstick: NEGATIVE
Ketones, ur: 15 mg/dL — AB
Nitrite: NEGATIVE
Protein, ur: 30 mg/dL — AB
Specific Gravity, Urine: 1.025 (ref 1.005–1.030)
Specific Gravity, Urine: 1.025 (ref 1.005–1.030)
Urobilinogen, UA: 1 mg/dL (ref 0.0–1.0)
pH: 6.5 (ref 5.0–8.0)
pH: 6 (ref 5.0–8.0)

## 2010-04-24 LAB — URINE MICROSCOPIC-ADD ON

## 2010-04-24 LAB — URINALYSIS, ROUTINE W REFLEX MICROSCOPIC
Glucose, UA: NEGATIVE mg/dL
Glucose, UA: NEGATIVE mg/dL
Hgb urine dipstick: NEGATIVE
Protein, ur: NEGATIVE mg/dL
Specific Gravity, Urine: 1.02 (ref 1.005–1.030)
pH: 6.5 (ref 5.0–8.0)

## 2010-04-24 LAB — GC/CHLAMYDIA PROBE AMP, GENITAL: Chlamydia, DNA Probe: NEGATIVE

## 2010-04-24 LAB — CBC
Platelets: 231 10*3/uL (ref 150–400)
RDW: 14.4 % (ref 11.5–15.5)

## 2010-04-25 LAB — CBC
Hemoglobin: 11.2 g/dL — ABNORMAL LOW (ref 12.0–15.0)
Platelets: 254 10*3/uL (ref 150–400)
RDW: 14.4 % (ref 11.5–15.5)
WBC: 9.9 10*3/uL (ref 4.0–10.5)

## 2010-04-25 LAB — ABO/RH: ABO/RH(D): A POS

## 2010-04-25 LAB — URINALYSIS, ROUTINE W REFLEX MICROSCOPIC
Ketones, ur: 15 mg/dL — AB
Nitrite: NEGATIVE
Protein, ur: NEGATIVE mg/dL
pH: 6 (ref 5.0–8.0)

## 2010-04-25 LAB — POCT PREGNANCY, URINE: Preg Test, Ur: POSITIVE

## 2010-05-28 NOTE — Discharge Summary (Signed)
NAME:  Denise Gillespie, Denise Gillespie       ACCOUNT NO.:  [id number]   MEDICAL RECORD NO.:  [id number]          PATIENT TYPE:  INP   LOCATION:  9102                          FACILITY:  [facility]   PHYSICIAN:  Ginger Carne, MD  DATE OF BIRTH:  [birth year]   DATE OF ADMISSION:  05/11/2007  DATE OF DISCHARGE:  05/14/2007                               DISCHARGE SUMMARY   DISCHARGE DIAGNOSIS:  Intrauterine pregnancy at 86 and 3 weeks'  gestational age with primary low-transverse cesarean section for breech  presentation.   PROCEDURES:  1. Primary low-transverse C-section  2. Magnesium infusion for pregnancy-induced hypertension.   LABORATORY DATA:  Hemoglobin at discharge was 9.8.  Blood type A  positive, rubella immune, HIV, and RPR negative.  GBS positive.   REASON FOR ADMISSION:  Induction of labor for pregnancy-induced  hypertension, found to be breech, and thus sent for low-transverse C-  section.   HOSPITAL COURSE:  This is an 20 year old G1, P1 at 75 and 25 weeks'  gestational age who presented for induction of labor secondary to  headaches and elevated blood pressures, but was found to be breech  presentation on ultrasound.  She was subsequently taken for a primary  low-transverse C-section of a viable female infant weighing 6 pounds 7  ounces and Apgars 9 and 9.  Estimated blood loss was 700.  No  complications with the C-section.  Mom was started on magnesium at  arrival and was continued for 24 hours postop that she tolerated without  complication.  Mom is breast and bottle feeding.  She desires Depo prior  to discharge and then Implanon on her 6-week postpartum check.  All labs  were stable.  On the day of discharge, the patient's blood pressures  were still running 150s systolic over 90s diastolic.  She was titrated  up to labetalol 800 mg p.o. b.i.d. and continued on hydrochlorothiazide  25 mg daily, and will have Baby Love to  check her blood pressure in 2  days.  She will also  follow up with Dr. Clelia Croft for blood pressure check on  Monday, May 17, 2007, and then again at 2 weeks and 6 weeks for  postpartum check.  It is very likely that her blood pressure will break  at some point, and she will be able to titrate down the labetalol.  Baby  Love to taken out the staples from her section in 2 days.  Orders were  written for this.  The patient is given a prescription for Percocet.  Baby was doing quite well and was going home with mother on the day of  discharge.      Lupita Raider, M.D.      Ginger Carne, MD  Electronically Signed    KS/MEDQ  D:  05/14/2007  T:  05/15/2007  Job:  [id number]

## 2010-05-28 NOTE — Discharge Summary (Signed)
Denise Gillespie, Denise Gillespie          ACCOUNT NO.:  [id number]   MEDICAL RECORD NO.:  [id number]          PATIENT TYPE:  WOC   LOCATION:  WOC                          FACILITY:  [facility]   PHYSICIAN:  Scheryl Darter, MD       DATE OF BIRTH:  12/20/1990   DATE OF ADMISSION:  09/11/2008  DATE OF DISCHARGE:  09/13/2008                               DISCHARGE SUMMARY   DIAGNOSES:  1. Intrauterine pregnancy, 34 weeks 6 days gestation.  2. Viral gastroenteritis.   The patient is a 20 year old black female gravida 2, para 1-0-0-1, who  presented on 09/11/2008 at 34 weeks 6 days gestation with fever and  malaise and several days of nausea, vomiting, and diarrhea.  She has  generalized body aches as well.  Her temperature has been 101 degrees at  home.  The patient is followed [facility]  due to chronic hypertension.  She had also been followed by Dr. Angeline Slim  at [facility].  She noted good fetal movement and few  irregular contractions.  No rupture of membranes or bleeding.   PAST MEDICAL HISTORY:  1. Chronic hypertension.  2. Morbid obesity.  3. The patient had a cesarean section for breech presentation.  Blood      type was A positive.  Gonorrhea and chlamydia were negative.   PAST SURGICAL HISTORY:  Cesarean section.   MEDICATIONS:  Prenatal vitamin, labetalol 300 mg p.o. b.i.d., Fioricet  for headache, and Macrodantin 100 mg p.o. b.i.d. for urinary tract  infection.   ALLERGIES:  No known drug allergies.   PHYSICAL EXAMINATION:  VITAL SIGNS:  Weight is 240 pounds, BP is 136/76,  temperature 101, pulse 120, respirations 22.  CHEST:  Clear.  HEART:  Regular rate and rhythm.  ABDOMEN:  Gravid with some mild tenderness, soft.  Cervix was long,  closed, and no signs of rupture of membranes.  Fetal heart rate was  elevated from the baseline at 160s.  There was good variability, no  decelerations.   LABORATORY ON ADMISSION:  White blood cell count  was 15.1 with a  hemoglobin of 10.4.  The patient was admitted for observation and  evaluation.  Ultrasound showed a viable intrauterine pregnancy, normal  amniotic fluid, cervical length.  Repeat white blood cell count was  17.6.   She felt somewhat better and she reported that her nausea and vomiting  are improving and that she had no more diarrhea after admission.  She  continued to improve and was afebrile for 24 hours and her appetite  returned.  Blood culture was negative.  Urine culture showed multiple  species.  She was discharged home on 09/13/2008.  Likely  diagnosis was viral gastroenteritis that was resolving.  She was  instructed to follow up at the [facility] as scheduled on  September 14, 2008.  She must report resumption of her symptoms.  I have  requested that she finish her Macrodantin that had been prescribed for  urinary tract infection.      Scheryl Darter, MD  Electronically Signed     JA/MEDQ  D:  09/13/2008  T:  09/13/2008  Job:  [id number]

## 2010-05-28 NOTE — Op Note (Signed)
NAMERINOA, GARRAMONE          ACCOUNT NO.:  [id number]   MEDICAL RECORD NO.:  [id number]          PATIENT TYPE:  INP   LOCATION:  9372                          FACILITY:  [facility]   PHYSICIAN:  Ginger Carne, MD  DATE OF BIRTH:  1990-11-14   DATE OF PROCEDURE:  05/11/2007  DATE OF DISCHARGE:                               OPERATIVE REPORT   PREOPERATIVE DIAGNOSES:  1. Intrauterine pregnancy at 38 weeks and 4 days' gestation.  2. Severe preeclampsia.  3. Breech presentation per ultrasound.  4. Group B streptococcus positive.   POSTOPERATIVE DIAGNOSES:  1. Intrauterine pregnancy at 38 weeks and 4 days' gestation.  2. Severe preeclampsia.  3. Vertex presentation.  4. Group B streptococcus positive.   PROCEDURE:  Primary low-transverse cesarean section.   SURGEON:  Ginger Carne, MD   ASSISTANT:  Karlton Lemon, MD   ANESTHESIA:  Spinal.   FINDINGS:  1. Viable infant female with Apgars 9 at 1 minute and 9 at 5 minutes      in a vertex presentation.  Weight was not available at the time of      dictation.  2. Clear amniotic fluid.  3. Normal female anatomy.   ESTIMATED BLOOD LOSS:  700 mL.   DRAINS:  Foley with clear yellow urine.   COMPLICATIONS:  None immediate.   SPECIMENS:  1. Placenta to pathology.  2. Cord blood to the lab.   INDICATIONS FOR PROCEDURE:  Ms. Fair is an 20 year old gravida 1,  para 0 at 70 weeks and 4 days' gestation who presents with complaints of  headache.  She states that the headache has been present for the last 3  days and this is associated with blurry vision as well.  Her initial  vital signs in the Maternity Admission Unit  reveal blood pressures in  the 160s to 170s systolic over 90s to 100s diastolic.  The patient had  been evaluated previously for preeclampsia with PIH labs which were  reportedly normal.  No 24-hour urine was performed.  She had 1+ protein  on urinalysis and PIH labs were otherwise normal in the MAU.   Ultrasound  was performed and noted to have a breech presentation.  The patient has  indication for primary low-transverse cesarean section for severe  preeclampsia criteria and breech presentation on ultrasound and an  uninducible cervix.   DESCRIPTION OF PROCEDURE:  The patient was taken to the operating room,  after obtaining adequate spinal anesthesia was prepped and draped in the  usual sterile manner in the supine position with left lateral uterine  displacement.  After ensuring adequate anesthesia, a Pfannenstiel skin  incision was made using the scalpel.  Incision was carried down through  subcutaneous tissues using the scalpel.  The rectus fascia was nicked to  the midline and the incision was extended laterally in each direction  using the Mayo scissors.  The rectus muscle was dissected free of the  fascia using both sharp and blunt dissection.  The rectus muscles were  separated bluntly.  The parietal peritoneum was identified, grasped  between two hemostats, elevated and entered inferiorly under direct  visualization with Metzenbaum scissors.  The parietal peritoneum was  further opened bluntly.  The bladder blade was then placed and a  reflection of the vesicoperitoneum superior to the bladder was  identified and elevated and incised using the Metzenbaum scissors and  the incision was extended laterally.  Bladder flap was created using  blunt dissection and retracted with bladder blade.  Low-transverse  uterine incision was made using the scalpel and the incision was  extended laterally and superiorly using the blunt dissection.  Hand was  placed within uterine cavity, used to elevate flex the head of the  infant, which was delivered without difficulty.  The infant was bulb  suctioned after delivery.  The body of the infant was delivered without  difficulty.  The cord was doubly clamped and cut and the infant handed  to the nursery team in attendance.  Specimens were  collected for cord  blood.  Placenta was delivered manually and appeared intact.  The uterus  was elevated onto the anterior abdominal wall and wrapped in a wet  laparotomy sponge.  The endometrial cavity was wiped free of any trace  of membranes using wet laparotomy sponges.  The uterine incision was  then closed in a running locking fashion with 0 Vicryl.  The areas of  bleeding were controlled with figure-of-eight stitches with 0 Vicryl.  The uterus was then inspected and found to have good hemostasis.  The  uterus was placed back within the abdominal cavity.  The uterine  incision was inspected once more and found to have good hemostasis.  Rectus muscles were inspected and small areas of bleeding were  controlled using Bovie cautery.  The rectus fascia was reapproximated  with 1 suture of 0 Vicryl in a running interlocking fashion.  The skin  was then reapproximate with stainless steel skin staples.  Sponge,  needle, and instrument counts were correct x2.  The patient tolerated  the procedure and went to the postanesthesia care unit in stable  condition.      Karlton Lemon, MD  Electronically Signed     ______________________________  Ginger Carne, MD    NS/MEDQ  D:  05/12/2007  T:  05/12/2007  Job:  [id number]

## 2010-08-21 ENCOUNTER — Ambulatory Visit: Payer: Medicaid Other | Admitting: Family Medicine

## 2010-08-26 ENCOUNTER — Ambulatory Visit: Payer: Medicaid Other | Admitting: Family Medicine

## 2010-10-01 ENCOUNTER — Ambulatory Visit (INDEPENDENT_AMBULATORY_CARE_PROVIDER_SITE_OTHER): Payer: Self-pay | Admitting: Family Medicine

## 2010-10-01 DIAGNOSIS — L0291 Cutaneous abscess, unspecified: Secondary | ICD-10-CM

## 2010-10-01 DIAGNOSIS — L0390 Cellulitis, unspecified: Secondary | ICD-10-CM | POA: Insufficient documentation

## 2010-10-01 NOTE — Assessment & Plan Note (Signed)
Multiple sites of cellulitis. Cellulitis below left breast and at inner left thigh are small and did not need  I&D at this time. Packing placed in an open lesion in right back. Patient to return in 2 days for reevaluation and possible packing removal. Will not start antibiotics at this time, since this is likely a chronic issue and there does not appear to be an acute infection. Discussed this case with attending physician.

## 2010-10-01 NOTE — Progress Notes (Signed)
  Subjective:    Patient ID: Denise Gillespie, female    DOB: 1990/12/01, 20 y.o.   MRN: [id number]  HPI Multiple skin abscesses: Patient reports that since under the left breast, one in the left inner thigh area, and one right mid back. Patient reports long history of similar abscesses that usually drained themselves and then resolve. Was at work today when her shirt became wet on the right side. Patient realized it was an abscess that had drained. Was sent for evaluation to our office today by her boss. No fever. No chills. No body aches.  Review of Systems As per above.    Objective:   Physical Exam  Constitutional: She is oriented to person, place, and time. She appears well-developed.       Obese  Cardiovascular: Normal rate, regular rhythm and normal heart sounds.   No murmur heard. Pulmonary/Chest: Effort normal. No respiratory distress.  Abdominal: Soft. She exhibits no distension. There is no tenderness.  Neurological: She is alert and oriented to person, place, and time.  Skin: No rash noted.       Left breast: Quarter-sized nodule located midline at the bottom of breast. No fluctuance. Positive tenderness to palpation. No redness.  Left inner thigh: Hyperpigmented scarring on inner thigh. Dime-sized nodule on left inner thigh. Indurated. No fluctuance. Minimal erythema. Small amount Clear drainage.  Right mid back: Located between the skin folds, 3mm circular hole present, minimal, in cream colored drainage present. Indurated approximately 0.5 inch around hole. No fluctuance. Open space below opening into tissue where abscess have been present prior to draining.  Psychiatric: She has a normal mood and affect. Her behavior is normal.          Assessment & Plan:

## 2010-10-03 LAB — URINALYSIS, ROUTINE W REFLEX MICROSCOPIC
Glucose, UA: NEGATIVE
pH: 6.5

## 2010-10-03 LAB — RAPID STREP SCREEN (MED CTR MEBANE ONLY): Streptococcus, Group A Screen (Direct): NEGATIVE

## 2010-10-04 LAB — URINALYSIS, ROUTINE W REFLEX MICROSCOPIC
Nitrite: NEGATIVE
Specific Gravity, Urine: 1.025
Urobilinogen, UA: 0.2

## 2010-10-04 LAB — WET PREP, GENITAL
Trich, Wet Prep: NONE SEEN
Yeast Wet Prep HPF POC: NONE SEEN

## 2010-10-04 LAB — GC/CHLAMYDIA PROBE AMP, GENITAL: Chlamydia, DNA Probe: NEGATIVE

## 2010-10-08 LAB — URINE MICROSCOPIC-ADD ON

## 2010-10-08 LAB — COMPREHENSIVE METABOLIC PANEL
ALT: 11
AST: 21
Albumin: 2.6 — ABNORMAL LOW
CO2: 24
Calcium: 8.9
GFR calc Af Amer: >60
Sodium: 135
Total Protein: 5.5 — ABNORMAL LOW

## 2010-10-08 LAB — CBC
HCT: 29 — ABNORMAL LOW
Hemoglobin: 9.8 — ABNORMAL LOW
MCHC: 33.7
MCHC: 33.8
MCV: 84.2
Platelets: 182
RBC: 3.39 — ABNORMAL LOW
RBC: 3.44 — ABNORMAL LOW
RDW: 15.6 — ABNORMAL HIGH

## 2010-10-08 LAB — URINALYSIS, ROUTINE W REFLEX MICROSCOPIC
Hgb urine dipstick: NEGATIVE
Nitrite: NEGATIVE
Specific Gravity, Urine: >1.03 — ABNORMAL HIGH
pH: 6.5

## 2010-10-24 LAB — CBC
Platelets: 287
RBC: 3.86
WBC: 7.8

## 2010-10-24 LAB — WET PREP, GENITAL
Trich, Wet Prep: NONE SEEN
Yeast Wet Prep HPF POC: NONE SEEN

## 2010-10-24 LAB — ABO/RH: ABO/RH(D): A POS

## 2010-12-11 ENCOUNTER — Ambulatory Visit: Payer: Self-pay

## 2012-04-21 ENCOUNTER — Ambulatory Visit (INDEPENDENT_AMBULATORY_CARE_PROVIDER_SITE_OTHER): Payer: BLUE CROSS/BLUE SHIELD | Admitting: Family Medicine

## 2012-04-21 ENCOUNTER — Encounter: Payer: Self-pay | Admitting: Family Medicine

## 2012-04-21 VITALS — BP 122/70 | Temp 98.7°F | Ht 61.6 in | Wt 268.0 lb

## 2012-04-21 DIAGNOSIS — L0390 Cellulitis, unspecified: Secondary | ICD-10-CM

## 2012-04-21 DIAGNOSIS — L0291 Cutaneous abscess, unspecified: Secondary | ICD-10-CM

## 2012-04-21 DIAGNOSIS — Z111 Encounter for screening for respiratory tuberculosis: Secondary | ICD-10-CM

## 2012-04-21 MED ORDER — DOXYCYCLINE HYCLATE 100 MG PO TABS: 100.0000 mg | ORAL_TABLET | Freq: Two times a day (BID) | ORAL | Status: DC

## 2012-04-21 MED ORDER — TRAMADOL HCL 50 MG PO TABS: 50.0000 mg | ORAL_TABLET | Freq: Three times a day (TID) | ORAL | Status: DC | PRN

## 2012-04-21 NOTE — Patient Instructions (Addendum)
It was nice to meet you today.  I don't think there is anything to drain right now, so I am starting you on antibiotics.  Come back on Friday so that we can take a look and see if either of the areas can be drained.  Go to the ER or come back to clinic sooner if you start having fevers, nausea/vomiting where you are getting dehydrated, or the redness starts rapidly going outside of the borders that were marked.   Cellulitis Cellulitis is an infection of the skin and the tissue beneath it. The infected area is usually red and tender. Cellulitis occurs most often in the arms and lower legs.  CAUSES  Cellulitis is caused by bacteria that enter the skin through cracks or cuts in the skin. The most common types of bacteria that cause cellulitis are Staphylococcus and Streptococcus. SYMPTOMS   Redness and warmth.  Swelling.  Tenderness or pain.  Fever. DIAGNOSIS  Your caregiver can usually determine what is wrong based on a physical exam. Blood tests may also be done. TREATMENT  Treatment usually involves taking an antibiotic medicine. HOME CARE INSTRUCTIONS   Take your antibiotics as directed. Finish them even if you start to feel better.  Keep the infected arm or leg elevated to reduce swelling.  Apply a warm cloth to the affected area up to 4 times per day to relieve pain.  Only take over-the-counter or prescription medicines for pain, discomfort, or fever as directed by your caregiver.  Keep all follow-up appointments as directed by your caregiver. SEEK MEDICAL CARE IF:   You notice red streaks coming from the infected area.  Your red area gets larger or turns dark in color.  Your bone or joint underneath the infected area becomes painful after the skin has healed.  Your infection returns in the same area or another area.  You notice a swollen bump in the infected area.  You develop new symptoms. SEEK IMMEDIATE MEDICAL CARE IF:   You have a fever.  You feel very  sleepy.  You develop vomiting or diarrhea.  You have a general ill feeling (malaise) with muscle aches and pains. MAKE SURE YOU:   Understand these instructions.  Will watch your condition.  Will get help right away if you are not doing well or get worse. Document Released: 10/09/2004 Document Revised: 07/01/2011 Document Reviewed: 03/17/2011 [facility] Patient Information 2013 [place], [place].

## 2012-04-21 NOTE — Progress Notes (Signed)
S: Pt comes in today for SDA for bites/boils.  She reports that Sunday night, she felt like she got bit by something on her R leg; noticed a bit area of swelling later that night.  Area has a head on it, may have drained a little pus.  Was very painful.  Then, 2 days later, noticed another swollen area with a head on her L elbow, also with some drainage.  No itching, just pain and tightness.  No other rash, other than 2 boils on her back that have been present for a month- no causing pain, no drainage from those sites.  No one at home has anything similar.  Does have a h/o boils in the past, but these feel different.  Is taking tylenol for the pain, with only minimal relief.  No fevers/chills.  Did have N/V and light headedness yesterday.  Has implanon, cannot be pregnant.    ROS: Per HPI  History  Smoking status  . Current Some Day Smoker  . Types: Cigarettes  Smokeless tobacco  . Never Used    O:  Filed Vitals:   04/21/12 1445  BP: 122/70  Temp: 98.7 F (37.1 C)    Gen: NAD Skin: 2cm of erythema with 3-4 cm of induration on right mid lateral lower leg; 3cm of erythema with 5cm of induration L lateral elbow; no areas of fluctuance, minimal warmth; both areas with small head; head of L leg cellulitis with clear/serous drainage, but unable to express any true d/c; pt reports exquisite TTP    A/P: 21 y.o. female p/w cellulitis, possible early abscess -See problem list -f/u in 2 days

## 2012-04-21 NOTE — Assessment & Plan Note (Signed)
2 areas of cellulitis, no definitive area to I&D today.  Erythema and induration marked with marker; f/u Friday for possible I&D.  Will start doxy for now, as high possibility of MRSA given her frequent cellulitis/abscess history.  Red flags for sooner f/u discussed.  Given tramadol due to pt reported extreme pain.

## 2012-04-23 ENCOUNTER — Ambulatory Visit (INDEPENDENT_AMBULATORY_CARE_PROVIDER_SITE_OTHER): Payer: Self-pay | Admitting: *Deleted

## 2012-04-23 ENCOUNTER — Encounter: Payer: Self-pay | Admitting: *Deleted

## 2012-04-23 DIAGNOSIS — Z111 Encounter for screening for respiratory tuberculosis: Secondary | ICD-10-CM

## 2012-04-23 LAB — TB SKIN TEST: Induration: 0 mm

## 2012-04-25 NOTE — Progress Notes (Signed)
Patient here for nurse visit to read PPD after 48 hours.  Gaylene Brooks, RN

## 2012-06-17 ENCOUNTER — Encounter (HOSPITAL_COMMUNITY): Payer: Self-pay | Admitting: Emergency Medicine

## 2012-06-17 ENCOUNTER — Emergency Department (HOSPITAL_COMMUNITY)
Admission: EM | Admit: 2012-06-17 | Discharge: 2012-06-17 | Disposition: A | Discharge status: Home / Self Care | Payer: Self-pay | Attending: Emergency Medicine | Admitting: Emergency Medicine

## 2012-06-17 DIAGNOSIS — L02219 Cutaneous abscess of trunk, unspecified: Secondary | ICD-10-CM | POA: Insufficient documentation

## 2012-06-17 DIAGNOSIS — L03319 Cellulitis of trunk, unspecified: Secondary | ICD-10-CM | POA: Insufficient documentation

## 2012-06-17 DIAGNOSIS — R110 Nausea: Secondary | ICD-10-CM | POA: Insufficient documentation

## 2012-06-17 DIAGNOSIS — Z862 Personal history of diseases of the blood and blood-forming organs and certain disorders involving the immune mechanism: Secondary | ICD-10-CM | POA: Insufficient documentation

## 2012-06-17 DIAGNOSIS — IMO0002 Reserved for concepts with insufficient information to code with codable children: Secondary | ICD-10-CM | POA: Insufficient documentation

## 2012-06-17 DIAGNOSIS — F17200 Nicotine dependence, unspecified, uncomplicated: Secondary | ICD-10-CM | POA: Insufficient documentation

## 2012-06-17 DIAGNOSIS — L0291 Cutaneous abscess, unspecified: Secondary | ICD-10-CM

## 2012-06-17 DIAGNOSIS — E669 Obesity, unspecified: Secondary | ICD-10-CM | POA: Insufficient documentation

## 2012-06-17 DIAGNOSIS — Z8742 Personal history of other diseases of the female genital tract: Secondary | ICD-10-CM | POA: Insufficient documentation

## 2012-06-17 HISTORY — DX: Cutaneous abscess, unspecified: L02.91

## 2012-06-17 MED ORDER — PROMETHAZINE HCL 25 MG PO TABS: 25.0000 mg | ORAL_TABLET | Freq: Four times a day (QID) | ORAL | Status: DC | PRN

## 2012-06-17 MED ORDER — ONDANSETRON 8 MG PO TBDP
8.0000 mg | ORAL_TABLET | Freq: Once | ORAL | Status: AC
  Administered 2012-06-17: 8 mg via ORAL
  Filled 2012-06-17: qty 1

## 2012-06-17 MED ORDER — SULFAMETHOXAZOLE-TRIMETHOPRIM 800-160 MG PO TABS: 1.0000 | ORAL_TABLET | Freq: Two times a day (BID) | ORAL | Status: DC

## 2012-06-17 MED ORDER — HYDROCODONE-ACETAMINOPHEN 5-325 MG PO TABS: 2.0000 | ORAL_TABLET | Freq: Four times a day (QID) | ORAL | Status: DC | PRN

## 2012-06-17 MED ORDER — MORPHINE SULFATE 4 MG/ML IJ SOLN
4.0000 mg | Freq: Once | INTRAMUSCULAR | Status: AC
  Administered 2012-06-17: 4 mg via INTRAMUSCULAR
  Filled 2012-06-17: qty 1

## 2012-06-17 NOTE — ED Notes (Addendum)
Pt reports 2 year hx of recurrent abscesses on whole body. Currently draining from l/side, r/side and back, and under r/arm

## 2012-06-17 NOTE — Progress Notes (Signed)
[facility] has seen patient and provided her with a list of primary care resources. °

## 2012-06-17 NOTE — ED Provider Notes (Signed)
History    This chart was scribed for non-physician practitioner,Darling Cieslewicz Mylinda Latina, working with Glynn Octave, MD by Donne Anon, ED Scribe. This patient was seen in room WTR8/WTR8 and the patient's care was started at 1556.   CSN: [id number]  Arrival date & time 06/17/12  1511   First MD Initiated Contact with Patient 06/17/12 1556      Chief Complaint  Patient presents with  . Abscess    Multiple, recurrent abscesses on torso and under arms    The history is provided by the patient. No language interpreter was used.   HPI Comments: Denise Gillespie is a 22 y.o. female who presents to the Emergency Department complaining of 2 years of gradual onset, gradually worsening, constant abscesses over her torso. She reports she currently has abscesses draining on the left flank, right flank, back and upper right arm. She has been seen at [facility] 1.5 months ago and reports she was diagnosed with staph, given an antibiotic and had the abscesses drained and packed. She reports associated nausea. She denies fever, vomiting, abdominal pain or any other pain.   Past Medical History  Diagnosis Date  . Gestational hypertension     Followed by [facility] for pregnancy  . Anemia, iron deficiency   . Obesity   . Abscess     multiple    Past Surgical History  Procedure Laterality Date  . Cesarean section    . Incise and drain abcess      Family History  Problem Relation Age of Onset  . Obesity Mother   . Heart disease Mother   . Hypertension Mother   . Migraines Mother   . Eczema Brother     History  Substance Use Topics  . Smoking status: Current Some Day Smoker    Types: Cigarettes  . Smokeless tobacco: Never Used  . Alcohol Use: No    OB History   Grav Para Term Preterm Abortions TAB SAB Ect Mult Living                  Review of Systems  Constitutional: Negative for fever.  Gastrointestinal: Positive for nausea. Negative for vomiting and abdominal  pain.  Skin: Positive for wound.  All other systems reviewed and are negative.    Allergies  Review of patient's allergies indicates no known allergies.  Home Medications   Current Outpatient Rx  Name  Route  Sig  Dispense  Refill  . acetaminophen (TYLENOL) 500 MG tablet   Oral   Take 1,000 mg by mouth every 6 (six) hours as needed for pain.         . Etonogestrel (IMPLANON) 68 MG IMPL      Inserted 08/29/2009            BP 120/60  Pulse 98  Temp(Src) 98.1 F (36.7 C) (Oral)  Resp 16  Wt 247 lb (112.038 kg)  BMI 45.74 kg/m2  SpO2 99%  Physical Exam  Nursing note and vitals reviewed. Constitutional: She is oriented to person, place, and time. She appears well-developed and well-nourished. No distress.  HENT:  Head: Normocephalic and atraumatic.  Right Ear: External ear normal.  Left Ear: External ear normal.  Nose: Nose normal.  Mouth/Throat: Oropharynx is clear and moist.  Eyes: Conjunctivae are normal.  Neck: Normal range of motion.  Cardiovascular: Normal rate, regular rhythm and normal heart sounds.   Pulmonary/Chest: Effort normal and breath sounds normal. No stridor. No respiratory distress. She has  no wheezes. She has no rales.  Abdominal: Soft. She exhibits no distension.  Musculoskeletal: Normal range of motion.  Neurological: She is alert and oriented to person, place, and time. She has normal strength.  Skin: Skin is warm and dry. She is not diaphoretic. No erythema.  2 cm area of induration with central purulent drainage on right back. In middle back 2 cm area of induration without drainage on mid back.  1 cm ulceration without surrounding erythema, induration or drainage on left lower back.  Psychiatric: She has a normal mood and affect. Her behavior is normal.    ED Course  Procedures (including critical care time) DIAGNOSTIC STUDIES: Oxygen Saturation is 99% on RA, normal by my interpretation.    COORDINATION OF CARE: 4:33 PM Discussed  treatment plan which includes ultrasound and possible I&D with pt at bedside and pt agreed to plan.   5:01 PM Ultrasound performed which showed pockets of puss. Will perform I&D. Advised pt to follow up with PCP in 3 days. Return precautions advised.  INCISION AND DRAINAGE Performed by: Junious Silk PA-C Consent: Verbal consent obtained. Risks and benefits: risks, benefits and alternatives were discussed Type: abscess  Body area: right back  Anesthesia: local infiltration  Incision was made with a scalpel.  Local anesthetic: lidocaine 2% without epinephrine  Anesthetic total: 2 ml  Complexity: complex Blunt dissection to break up loculations  Drainage: purulent  Drainage amount: mild  Packing material: 1/4 in iodoform gauze  Patient tolerance: Patient tolerated the procedure well with no immediate complications.  INCISION AND DRAINAGE Performed by: Junious Silk PA-C Consent: Verbal consent obtained. Risks and benefits: risks, benefits and alternatives were discussed Type: abscess  Body area: mid back  Anesthesia: local infiltration  Incision was made with a scalpel.  Local anesthetic: lidocaine 2% without epinephrine  Anesthetic total: 2 ml  Complexity: complex Blunt dissection to break up loculations  Drainage: purulent  Drainage amount: mild  Packing material: 1/4 in iodoform gauze  Patient tolerance: Patient tolerated the procedure well with no immediate complications.   INCISION AND DRAINAGE Performed JY:NWGNFA Konrad Dolores PA-C Consent: Verbal consent obtained. Risks and benefits: risks, benefits and alternatives were discussed Type: abscess  Body area: left back  Anesthesia: local infiltration  Incision was made with a scalpel.  Local anesthetic: lidocaine 2% without  epinephrine  Anesthetic total: 2 ml  Complexity: complex Blunt dissection to break up loculations  Drainage: purulent  Drainage amount: mild  Packing material: 1/4  in iodoform gauze  Patient tolerance: Patient tolerated the procedure well with no immediate complications.         Labs Reviewed - No data to display No results found.   1. Abscess       MDM  Patient with skin abscess amenable to incision and drainage.  Abscess was packed,  wound recheck in 2 days. Encouraged home warm soaks and flushing.  Mild signs of cellulitis is surrounding skin.  Will d/c to home.  Bactrim given due to recurrence of these abscesses.    I personally performed the services described in this documentation, which was scribed in my presence. The recorded information has been reviewed and is accurate.        Mora Bellman, PA-C 06/18/12 1006

## 2012-06-17 NOTE — ED Notes (Signed)
CBG 78. 

## 2012-06-18 LAB — GLUCOSE, CAPILLARY: Glucose-Capillary: 78 mg/dL (ref 70–99)

## 2012-06-18 NOTE — ED Provider Notes (Signed)
Medical screening examination/treatment/procedure(s) were performed by non-physician practitioner and as supervising physician I was immediately available for consultation/collaboration.   Glynn Octave, MD 06/18/12 1150

## 2012-12-15 ENCOUNTER — Emergency Department (HOSPITAL_COMMUNITY)
Admission: EM | Admit: 2012-12-15 | Discharge: 2012-12-16 | Disposition: A | Discharge status: Home / Self Care | Payer: Self-pay | Attending: Emergency Medicine | Admitting: Emergency Medicine

## 2012-12-15 ENCOUNTER — Encounter (HOSPITAL_COMMUNITY): Payer: Self-pay | Admitting: Emergency Medicine

## 2012-12-15 ENCOUNTER — Emergency Department (HOSPITAL_COMMUNITY): Payer: Self-pay

## 2012-12-15 DIAGNOSIS — X503XXA Overexertion from repetitive movements, initial encounter: Secondary | ICD-10-CM | POA: Insufficient documentation

## 2012-12-15 DIAGNOSIS — E669 Obesity, unspecified: Secondary | ICD-10-CM | POA: Insufficient documentation

## 2012-12-15 DIAGNOSIS — Y9389 Activity, other specified: Secondary | ICD-10-CM | POA: Insufficient documentation

## 2012-12-15 DIAGNOSIS — Z8679 Personal history of other diseases of the circulatory system: Secondary | ICD-10-CM | POA: Insufficient documentation

## 2012-12-15 DIAGNOSIS — IMO0002 Reserved for concepts with insufficient information to code with codable children: Secondary | ICD-10-CM | POA: Insufficient documentation

## 2012-12-15 DIAGNOSIS — S43402A Unspecified sprain of left shoulder joint, initial encounter: Secondary | ICD-10-CM

## 2012-12-15 DIAGNOSIS — L02219 Cutaneous abscess of trunk, unspecified: Secondary | ICD-10-CM | POA: Insufficient documentation

## 2012-12-15 DIAGNOSIS — F17200 Nicotine dependence, unspecified, uncomplicated: Secondary | ICD-10-CM | POA: Insufficient documentation

## 2012-12-15 DIAGNOSIS — Z862 Personal history of diseases of the blood and blood-forming organs and certain disorders involving the immune mechanism: Secondary | ICD-10-CM | POA: Insufficient documentation

## 2012-12-15 DIAGNOSIS — Z975 Presence of (intrauterine) contraceptive device: Secondary | ICD-10-CM | POA: Insufficient documentation

## 2012-12-15 DIAGNOSIS — L0291 Cutaneous abscess, unspecified: Secondary | ICD-10-CM

## 2012-12-15 DIAGNOSIS — X500XXA Overexertion from strenuous movement or load, initial encounter: Secondary | ICD-10-CM | POA: Insufficient documentation

## 2012-12-15 DIAGNOSIS — Y9210 Unspecified residential institution as the place of occurrence of the external cause: Secondary | ICD-10-CM | POA: Insufficient documentation

## 2012-12-15 MED ORDER — NAPROXEN 500 MG PO TABS: 500.0000 mg | ORAL_TABLET | Freq: Two times a day (BID) | ORAL | Status: DC

## 2012-12-15 MED ORDER — SULFAMETHOXAZOLE-TRIMETHOPRIM 800-160 MG PO TABS: 1.0000 | ORAL_TABLET | Freq: Two times a day (BID) | ORAL | Status: DC
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
## 2012-12-15 NOTE — ED Provider Notes (Signed)
CSN: [id number]     Arrival date & time 12/15/12  2109 History  This chart was scribed for Jaynie Crumble, PA-C, working with Toy Baker, MD, by Ardelia Mems ED Scribe. This patient was seen in room WTR7/WTR7 and the patient's care was started at 11:28 PM.    Chief Complaint  Patient presents with  . Shoulder Injury    The history is provided by the patient. No language interpreter was used.    HPI Comments: Denise Gillespie is a 22 y.o. 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 She states that she heard a "pop" in her shoulder at that time. She states that she has a remote history of left clavicular fracture.  She is also complaining of abscesses over her torso which she states have returned. She states that she was seen in the ED several months ago for these, and given antibiotics. She states that the abscesses resolved but have gradually came back.  She denies any other pain or symptoms.   Past Medical History  Diagnosis Date  . Gestational hypertension     Followed by [facility] for pregnancy  . Anemia, iron deficiency   . Obesity   . Abscess     multiple   Past Surgical History  Procedure Laterality Date  . Cesarean section    . Incise and drain abcess     Family History  Problem Relation Age of Onset  . Obesity Mother   . Heart disease Mother   . Hypertension Mother   . Migraines Mother   . Eczema Brother    History  Substance Use Topics  . Smoking status: Current Some Day Smoker    Types: Cigarettes  . Smokeless tobacco: Never Used  . Alcohol Use: No   OB History   Grav Para Term Preterm Abortions TAB SAB Ect Mult Living                 Review of Systems  Musculoskeletal: Positive for arthralgias (left shoulder).  Skin: Positive for wound (abscesses).  All other systems  reviewed and are negative.   Allergies  Review of patient's allergies indicates no known allergies.  Home Medications   Current Outpatient Rx  Name  Route  Sig  Dispense  Refill  . acetaminophen (TYLENOL) 500 MG tablet   Oral   Take 1,000 mg by mouth every 6 (six) hours as needed for pain.         . Etonogestrel (IMPLANON) 68 MG IMPL      Inserted 08/29/2009          . promethazine (PHENERGAN) 25 MG tablet   Oral   Take 1 tablet (25 mg total) by mouth every 6 (six) hours as needed for nausea.   12 tablet   0   . sulfamethoxazole-trimethoprim (SEPTRA DS) 800-160 MG per tablet   Oral   Take 1 tablet by mouth every 12 (twelve) hours.   20 tablet   0    Triage Vitals: BP 129/73  Pulse 88  Temp(Src) 98.8 F (37.1 C) (Oral)  Resp 15  Ht 5\' 2"  (1.575 m)  Wt 262 lb (118.842 kg)  BMI 47.91 kg/m2  SpO2 98%  Physical Exam  Nursing note and vitals reviewed. Constitutional: She is oriented to person, place, and time. She appears well-developed  and well-nourished. No distress.  HENT:  Head: Normocephalic and atraumatic.  Eyes: EOM are normal.  Neck: Neck supple. No tracheal deviation present.  Cardiovascular: Normal rate.   Pulmonary/Chest: Effort normal. No respiratory distress.  Musculoskeletal: Normal range of motion.  Normal appearing left shoulder. Tender to anterior posterior joint. Pain with full range of motion with flexion extension. Patient is able to move her shoulder actively and passively past 90. Negative straight arm drop test. Pain with external and internal rotation. Distal radial pulses are intact. Bicep and tricep strength intact.  Neurological: She is alert and oriented to person, place, and time.  Skin: Skin is warm and dry.  2 cm x 3 cm abscess of skin to the right neck. Tender to palpation. No drainage. Several small abscesses to the right mid axillary chest. These are open with no drainage.  Psychiatric: She has a normal mood and affect. Her  behavior is normal.    ED Course  Procedures (including critical care time)  DIAGNOSTIC STUDIES: Oxygen Saturation is 98% on RA, normal by my interpretation.    COORDINATION OF CARE: 11:33 PM- Discussed radiology findings with no acute abnormalities. Pt advised of plan for treatment and pt agrees.  Labs Review Labs Reviewed - No data to display Imaging Review Dg Shoulder Left  12/15/2012   CLINICAL DATA:  Trauma and pain.  EXAM: LEFT SHOULDER - 2+ VIEW  COMPARISON:  01/20/2006  FINDINGS: Remote left clavicular trauma. No acute fracture or dislocation.  IMPRESSION: Remote left clavicular fracture.   Electronically Signed   By: Jeronimo Greaves M.D.   On: 12/15/2012 22:51    EKG Interpretation   None       MDM  No diagnosis found.  Patient's with left shoulder injury, x-rays negative. Suspect possible rotator cuff injury strain versus a tear. Will start on NSAIDs and ice at home, no heavy lifting, followup with orthopedics as needed. Patient also has several abscess these, some are draining, some are not. She is refusing an incision and drainage at this time. She is asking for an of ionic. Will start her body can have her followup with her primary care doctor.   Filed Vitals:   12/15/12 2156 12/15/12 2203  BP: 129/73   Pulse: 88   Temp: 98.8 F (37.1 C)   TempSrc: Oral   Resp: 15   Height:  5\' 2"  (1.575 m)  Weight:  262 lb (118.842 kg)  SpO2: 98%     I personally performed the services described in this documentation, which was scribed in my presence. The recorded information has been reviewed and is accurate.   Lottie Mussel, PA-C 12/16/12 [phone]

## 2012-12-16 NOTE — ED Notes (Signed)
Pt ambulating independently w/ steady gait on d/c in no acute distress, A&Ox4. Rx given x2  

## 2012-12-17 NOTE — ED Provider Notes (Signed)
Medical screening examination/treatment/procedure(s) were performed by non-physician practitioner and as supervising physician I was immediately available for consultation/collaboration.  Alexes Lamarque T Lavonya Hoerner, MD 12/17/12 0524 

## 2013-02-04 ENCOUNTER — Emergency Department (HOSPITAL_COMMUNITY)
Admission: EM | Admit: 2013-02-04 | Discharge: 2013-02-04 | Disposition: A | Discharge status: Home / Self Care | Payer: Self-pay | Attending: Emergency Medicine | Admitting: Emergency Medicine

## 2013-02-04 ENCOUNTER — Encounter (HOSPITAL_COMMUNITY): Payer: Self-pay | Admitting: Emergency Medicine

## 2013-02-04 DIAGNOSIS — F17200 Nicotine dependence, unspecified, uncomplicated: Secondary | ICD-10-CM | POA: Insufficient documentation

## 2013-02-04 DIAGNOSIS — Z8679 Personal history of other diseases of the circulatory system: Secondary | ICD-10-CM | POA: Insufficient documentation

## 2013-02-04 DIAGNOSIS — L03221 Cellulitis of neck: Principal | ICD-10-CM

## 2013-02-04 DIAGNOSIS — Z791 Long term (current) use of non-steroidal anti-inflammatories (NSAID): Secondary | ICD-10-CM | POA: Insufficient documentation

## 2013-02-04 DIAGNOSIS — E669 Obesity, unspecified: Secondary | ICD-10-CM | POA: Insufficient documentation

## 2013-02-04 DIAGNOSIS — L0211 Cutaneous abscess of neck: Secondary | ICD-10-CM | POA: Insufficient documentation

## 2013-02-04 DIAGNOSIS — Z862 Personal history of diseases of the blood and blood-forming organs and certain disorders involving the immune mechanism: Secondary | ICD-10-CM | POA: Insufficient documentation

## 2013-02-04 MED ORDER — IBUPROFEN 800 MG PO TABS
800.0000 mg | ORAL_TABLET | Freq: Once | ORAL | Status: AC
Start: 2013-02-04 — End: 2013-02-04
  Administered 2013-02-04: 800 mg via ORAL
  Filled 2013-02-04: qty 1

## 2013-02-04 MED ORDER — HYDROCODONE-ACETAMINOPHEN 5-325 MG PO TABS
1.0000 | ORAL_TABLET | Freq: Once | ORAL | Status: AC
  Administered 2013-02-04: 1 via ORAL
  Filled 2013-02-04: qty 1

## 2013-02-04 MED ORDER — HYDROCODONE-ACETAMINOPHEN 5-325 MG PO TABS: 2.0000 | ORAL_TABLET | ORAL | Status: DC | PRN

## 2013-02-04 NOTE — Discharge Instructions (Signed)
Soak regularly. Warm compresses. Take ibuprofen for pain,  For severe pain take norco or vicodin however realize they have the potential for addiction and it can make you sleepy and has tylenol in it.  No operating machinery while taking.  If you were given medicines take as directed.  If you are on coumadin or contraceptives realize their levels and effectiveness is altered by many different medicines.  If you have any reaction (rash, tongues swelling, other) to the medicines stop taking and see a physician.   Please follow up as directed and return to the ER or see a physician for new or worsening symptoms.  Thank you.  Abscess An abscess is an infected area that contains a collection of pus and debris.It can occur in almost any part of the body. An abscess is also known as a furuncle or boil. CAUSES  An abscess occurs when tissue gets infected. This can occur from blockage of oil or sweat glands, infection of hair follicles, or a minor injury to the skin. As the body tries to fight the infection, pus collects in the area and creates pressure under the skin. This pressure causes pain. People with weakened immune systems have difficulty fighting infections and get certain abscesses more often.  SYMPTOMS Usually an abscess develops on the skin and becomes a painful mass that is red, warm, and tender. If the abscess forms under the skin, you may feel a moveable soft area under the skin. Some abscesses break open (rupture) on their own, but most will continue to get worse without care. The infection can spread deeper into the body and eventually into the bloodstream, causing you to feel ill.  DIAGNOSIS  Your caregiver will take your medical history and perform a physical exam. A sample of fluid may also be taken from the abscess to determine what is causing your infection. TREATMENT  Your caregiver may prescribe antibiotic medicines to fight the infection. However, taking antibiotics alone usually  does not cure an abscess. Your caregiver may need to make a small cut (incision) in the abscess to drain the pus. In some cases, gauze is packed into the abscess to reduce pain and to continue draining the area. HOME CARE INSTRUCTIONS   Only take over-the-counter or prescription medicines for pain, discomfort, or fever as directed by your caregiver.  If you were prescribed antibiotics, take them as directed. Finish them even if you start to feel better.  If gauze is used, follow your caregiver's directions for changing the gauze.  To avoid spreading the infection:  Keep your draining abscess covered with a bandage.  Wash your hands well.  Do not share personal care items, towels, or whirlpools with others.  Avoid skin contact with others.  Keep your skin and clothes clean around the abscess.  Keep all follow-up appointments as directed by your caregiver. SEEK MEDICAL CARE IF:   You have increased pain, swelling, redness, fluid drainage, or bleeding.  You have muscle aches, chills, or a general ill feeling.  You have a fever. MAKE SURE YOU:   Understand these instructions.  Will watch your condition.  Will get help right away if you are not doing well or get worse. Document Released: 10/09/2004 Document Revised: 07/01/2011 Document Reviewed: 03/14/2011 [facility] Patient Information 2014 [place], [place].

## 2013-02-04 NOTE — ED Provider Notes (Signed)
CSN: [id number]     Arrival date & time 02/04/13  1128 History   First MD Initiated Contact with Patient 02/04/13 1142     Chief Complaint  Patient presents with  . Abcess on neck    (Consider location/radiation/quality/duration/timing/severity/associated sxs/prior Treatment) HPI Comments: 23 yo female with obesity, abscess hx presents with right lateral neck abscess worsening for one week but intermittent since June. Pain to palpation.  No fevers or rash.  No drainage.    The history is provided by the patient.    Past Medical History  Diagnosis Date  . Gestational hypertension     Followed by [facility] for pregnancy  . Anemia, iron deficiency   . Obesity   . Abscess     multiple   Past Surgical History  Procedure Laterality Date  . Cesarean section    . Incise and drain abcess     Family History  Problem Relation Age of Onset  . Obesity Mother   . Heart disease Mother   . Hypertension Mother   . Migraines Mother   . Eczema Brother    History  Substance Use Topics  . Smoking status: Current Some Day Smoker    Types: Cigarettes  . Smokeless tobacco: Never Used  . Alcohol Use: No   OB History   Grav Para Term Preterm Abortions TAB SAB Ect Mult Living                 Review of Systems  Constitutional: Negative for fever and chills.  Respiratory: Negative for shortness of breath.   Cardiovascular: Negative for chest pain.  Gastrointestinal: Negative for vomiting and abdominal pain.  Genitourinary: Negative for dysuria and flank pain.  Musculoskeletal: Positive for neck pain. Negative for neck stiffness.  Skin: Positive for wound. Negative for rash.  Neurological: Negative for light-headedness and headaches.    Allergies  Review of patient's allergies indicates no known allergies.  Home Medications   Current Outpatient Rx  Name  Route  Sig  Dispense  Refill  . acetaminophen (TYLENOL) 500 MG tablet   Oral   Take 1,000 mg by mouth every 6 (six) hours as needed  for pain.         . Etonogestrel (IMPLANON) 68 MG IMPL      Inserted 08/29/2009          . naproxen (NAPROSYN) 500 MG tablet   Oral   Take 1 tablet (500 mg total) by mouth 2 (two) times daily.   30 tablet   0    BP 100/48  Pulse 93  Temp(Src) 98.2 F (36.8 C) (Oral)  Resp 18  SpO2 98% Physical Exam  Nursing note and vitals reviewed. Constitutional: She is oriented to person, place, and time. She appears well-developed and well-nourished.  HENT:  Head: Normocephalic.  Eyes: Conjunctivae are normal. Right eye exhibits no discharge. Left eye exhibits no discharge.  Neck: Normal range of motion. Neck supple. No tracheal deviation present.  Cardiovascular: Normal rate and regular rhythm.   Pulmonary/Chest: Effort normal and breath sounds normal.  Musculoskeletal: She exhibits tenderness. She exhibits no edema.  Neurological: She is alert and oriented to person, place, and time.  Skin: Skin is warm.  Right lateral neck swelling and pain, mild induration and fluctuance, no warmth or surrounding erythema, no streaking, neck supple  Psychiatric: She has a normal mood and affect.    ED Course  Procedures (including critical care time) EMERGENCY DEPARTMENT US SOFT TISSUE INTERPRETATION "Study: Limited Soft  Tissue Ultrasound"  INDICATIONS: Pain and Soft tissue infection Multiple views of the body part were obtained in real-time with a multi-frequency linear probe PERFORMED BY:  Myself IMAGES ARCHIVED?: Yes SIDE:Right  BODY PART:Neck FINDINGS: Abcess present and Cellulitis absent INTERPRETATION:  Abcess present and No cellulitis noted  INCISION AND DRAINAGE Performed by: Enid SkeensZAVITZ, Macklin Jacquin M Consent: Verbal consent obtained. Risks and benefits: risks, benefits and alternatives were discussed Type: abscess  Body area: right lateral neck Anesthesia: local infiltration Incision was made with a scalpel. Local anesthetic: lidocaine Anesthetic total: 5 ml Complexity: complex,  large, loculated Blunt dissection to break up loculations Drainage:10 cc pus  Patient tolerance: Patient tolerated the procedure well with no immediate complications.    Labs Review Labs Reviewed - No data to display Imaging Review No results found.  EKG Interpretation   None       MDM   1. Neck abscess    US bedside used to confirm fluid and extent, as well to rule out blood vessel involvement Large abscess drained after discussing r/b. Pt tolerated well. Pain meds in ED.  Results and differential diagnosis were discussed with the patient. Close follow up outpatient was discussed, patient comfortable with the plan.         Enid SkeensJoshua M Khaliel Morey, MD 02/04/13 1256

## 2013-02-04 NOTE — Progress Notes (Signed)
[facility] provided pt with a list of primary care resources and [facility] information.  °

## 2013-02-04 NOTE — ED Notes (Signed)
Pt c/o abscess on posterior neck.  Pt sts she has noticed it intermittently, since June 2014.  Pain score 10/10.

## 2013-02-11 ENCOUNTER — Telehealth: Payer: Self-pay | Admitting: Family Medicine

## 2013-02-11 NOTE — Telephone Encounter (Signed)
Pt notified.  Adrick Kestler L, CMA  

## 2013-02-11 NOTE — Telephone Encounter (Signed)
Pt would like a copy of her shot records left up front for pickup. jw

## 2013-02-11 NOTE — Telephone Encounter (Signed)
Shot records up front for pick up. Denise Gillespie.Cannen Dupras, Renato Battleshekla

## 2013-04-24 ENCOUNTER — Emergency Department (HOSPITAL_COMMUNITY)
Admission: EM | Admit: 2013-04-24 | Discharge: 2013-04-24 | Disposition: A | Discharge status: Home / Self Care | Payer: Self-pay | Attending: Emergency Medicine | Admitting: Emergency Medicine

## 2013-04-24 ENCOUNTER — Encounter (HOSPITAL_COMMUNITY): Payer: Self-pay | Admitting: Emergency Medicine

## 2013-04-24 DIAGNOSIS — Y929 Unspecified place or not applicable: Secondary | ICD-10-CM | POA: Insufficient documentation

## 2013-04-24 DIAGNOSIS — IMO0002 Reserved for concepts with insufficient information to code with codable children: Secondary | ICD-10-CM | POA: Insufficient documentation

## 2013-04-24 DIAGNOSIS — Z862 Personal history of diseases of the blood and blood-forming organs and certain disorders involving the immune mechanism: Secondary | ICD-10-CM | POA: Insufficient documentation

## 2013-04-24 DIAGNOSIS — Z8632 Personal history of gestational diabetes: Secondary | ICD-10-CM | POA: Insufficient documentation

## 2013-04-24 DIAGNOSIS — F17200 Nicotine dependence, unspecified, uncomplicated: Secondary | ICD-10-CM | POA: Insufficient documentation

## 2013-04-24 DIAGNOSIS — S0990XA Unspecified injury of head, initial encounter: Secondary | ICD-10-CM | POA: Insufficient documentation

## 2013-04-24 DIAGNOSIS — Y9389 Activity, other specified: Secondary | ICD-10-CM | POA: Insufficient documentation

## 2013-04-24 DIAGNOSIS — E669 Obesity, unspecified: Secondary | ICD-10-CM | POA: Insufficient documentation

## 2013-04-24 DIAGNOSIS — S0101XA Laceration without foreign body of scalp, initial encounter: Secondary | ICD-10-CM

## 2013-04-24 DIAGNOSIS — S0100XA Unspecified open wound of scalp, initial encounter: Secondary | ICD-10-CM | POA: Insufficient documentation
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
## 2013-04-24 NOTE — ED Notes (Signed)
Discharge papers are not available at this time- patient updated on reason for delay.

## 2013-04-24 NOTE — ED Notes (Signed)
MD at bedside. 
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
## 2014-04-27 ENCOUNTER — Encounter (HOSPITAL_COMMUNITY): Payer: Self-pay

## 2014-04-27 ENCOUNTER — Emergency Department (HOSPITAL_COMMUNITY)
Admission: EM | Admit: 2014-04-27 | Discharge: 2014-04-27 | Disposition: A | Discharge status: Home / Self Care | Payer: Self-pay | Attending: Emergency Medicine | Admitting: Emergency Medicine

## 2014-04-27 DIAGNOSIS — Z872 Personal history of diseases of the skin and subcutaneous tissue: Secondary | ICD-10-CM | POA: Insufficient documentation

## 2014-04-27 DIAGNOSIS — Z720 Tobacco use: Secondary | ICD-10-CM | POA: Insufficient documentation

## 2014-04-27 DIAGNOSIS — M5441 Lumbago with sciatica, right side: Secondary | ICD-10-CM | POA: Insufficient documentation

## 2014-04-27 DIAGNOSIS — Z862 Personal history of diseases of the blood and blood-forming organs and certain disorders involving the immune mechanism: Secondary | ICD-10-CM | POA: Insufficient documentation

## 2014-04-27 DIAGNOSIS — E669 Obesity, unspecified: Secondary | ICD-10-CM | POA: Insufficient documentation

## 2014-04-27 DIAGNOSIS — Z8679 Personal history of other diseases of the circulatory system: Secondary | ICD-10-CM | POA: Insufficient documentation

## 2014-04-27 MED ORDER — METHOCARBAMOL 500 MG PO TABS: 500.0000 mg | ORAL_TABLET | Freq: Two times a day (BID) | ORAL | Status: DC | PRN

## 2014-04-27 MED ORDER — ACETAMINOPHEN 325 MG PO TABS
650.0000 mg | ORAL_TABLET | Freq: Once | ORAL | Status: AC
  Administered 2014-04-27: 650 mg via ORAL
  Filled 2014-04-27: qty 2

## 2014-04-27 MED ORDER — NAPROXEN 500 MG PO TABS: 500.0000 mg | ORAL_TABLET | Freq: Two times a day (BID) | ORAL | Status: DC

## 2014-04-27 NOTE — Discharge Instructions (Signed)
Back Pain, Adult °Low back pain is very common. About 1 in 5 people have back pain. The cause of low back pain is rarely dangerous. The pain often gets better over time. About half of people with a sudden onset of back pain feel better in just 2 weeks. About 8 in 10 people feel better by 6 weeks.  °CAUSES °Some common causes of back pain include: °· Strain of the muscles or ligaments supporting the spine. °· Wear and tear (degeneration) of the spinal discs. °· Arthritis. °· Direct injury to the back. °DIAGNOSIS °Most of the time, the direct cause of low back pain is not known. However, back pain can be treated effectively even when the exact cause of the pain is unknown. Answering your caregiver's questions about your overall health and symptoms is one of the most accurate ways to make sure the cause of your pain is not dangerous. If your caregiver needs more information, he or she may order lab work or imaging tests (X-rays or MRIs). However, even if imaging tests show changes in your back, this usually does not require surgery. °HOME CARE INSTRUCTIONS °For many people, back pain returns. Since low back pain is rarely dangerous, it is often a condition that people can learn to manage on their own.  °· Remain active. It is stressful on the back to sit or stand in one place. Do not sit, drive, or stand in one place for more than 30 minutes at a time. Take short walks on level surfaces as soon as pain allows. Try to increase the length of time you walk each day. °· Do not stay in bed. Resting more than 1 or 2 days can delay your recovery. °· Do not avoid exercise or work. Your body is made to move. It is not dangerous to be active, even though your back may hurt. Your back will likely heal faster if you return to being active before your pain is gone. °· Pay attention to your body when you  bend and lift. Many people have less discomfort when lifting if they bend their knees, keep the load close to their bodies, and  avoid twisting. Often, the most comfortable positions are those that put less stress on your recovering back. °· Find a comfortable position to sleep. Use a firm mattress and lie on your side with your knees slightly bent. If you lie on your back, put a pillow under your knees. °· Only take over-the-counter or prescription medicines as directed by your caregiver. Over-the-counter medicines to reduce pain and inflammation are often the most helpful. Your caregiver may prescribe muscle relaxant drugs. These medicines help dull your pain so you can more quickly return to your normal activities and healthy exercise. °· Put ice on the injured area. °· Put ice in a plastic bag. °· Place a towel between your skin and the bag. °· Leave the ice on for 15-20 minutes, 03-04 times a day for the first 2 to 3 days. After that, ice and heat may be alternated to reduce pain and spasms. °· Ask your caregiver about trying back exercises and gentle massage. This may be of some benefit. °· Avoid feeling anxious or stressed. Stress increases muscle tension and can worsen back pain. It is important to recognize when you are anxious or stressed and learn ways to manage it. Exercise is a great option. °SEEK MEDICAL CARE IF: °· You have pain that is not relieved with rest or medicine. °· You have pain that does not improve in 1 week. °· You have new symptoms. °· You are generally not feeling well. °SEEK   IMMEDIATE MEDICAL CARE IF:  °· You have pain that radiates from your back into your legs. °· You develop new bowel or bladder control problems. °· You have unusual weakness or numbness in your arms or legs. °· You develop nausea or vomiting. °· You develop abdominal pain. °· You feel faint. °Document Released: 12/30/2004 Document Revised: 07/01/2011 Document Reviewed: 05/03/2013 °Exit[employer]® Patient Information ©2015 [employer]. This information is not intended to replace advice given to you by your health care provider. Make sure you  discuss any questions you have with your health care provider. ° °Back Exercises °Back exercises help treat and prevent back injuries. The goal of back exercises is to increase the strength of your abdominal and back muscles and the flexibility of your back. These exercises should be started when you no longer have back pain. Back exercises include: °· Pelvic Tilt. Lie on your back with your knees bent. Tilt your pelvis until the lower part of your back is against the floor. Hold this position 5 to 10 sec and repeat 5 to 10 times. °· Knee to Chest. Pull first 1 knee up against your chest and hold for 20 to 30 seconds, repeat this with the other knee, and then both knees. This may be done with the other leg straight or bent, whichever feels better. °· Sit-Ups or Curl-Ups. Bend your knees 90 degrees. Start with tilting your pelvis, and do a partial, slow sit-up, lifting your trunk only 30 to 45 degrees off the floor. Take at least 2 to 3 seconds for each sit-up. Do not do sit-ups with your knees out straight. If partial sit-ups are difficult, simply do the above but with only tightening your abdominal muscles and holding it as directed. °· Hip-Lift. Lie on your back with your knees flexed 90 degrees. Push down with your feet and shoulders as you raise your hips a couple inches off the floor; hold for 10 seconds, repeat 5 to 10 times. °· Back arches. Lie on your stomach, propping yourself up on bent elbows. Slowly press on your hands, causing an arch in your low back. Repeat 3 to 5 times. Any initial stiffness and discomfort should lessen with repetition over time. °· Shoulder-Lifts. Lie face down with arms beside your body. Keep hips and torso pressed to floor as you slowly lift your head and shoulders off the floor. °Do not overdo your exercises, especially in the beginning. Exercises may cause you some mild back discomfort which lasts for a few minutes; however, if the pain is more severe, or lasts for more than 15  minutes, do not continue exercises until you see your caregiver. Improvement with exercise therapy for back problems is slow.  °See your caregivers for assistance with developing a proper back exercise program. °Document Released: 02/07/2004 Document Revised: 03/24/2011 Document Reviewed: 10/31/2010 °ExitCare® Patient Information ©2015 [employer]. This information is not intended to replace advice given to you by your health care provider. Make sure you discuss any questions you have with your health care provider. °Sciatica °Sciatica is pain, weakness, numbness, or tingling along the path of the sciatic nerve. The nerve starts in the lower back and runs down the back of each leg. The nerve controls the muscles in the lower leg and in the back of the knee, while also providing sensation to the back of the thigh, lower leg, and the sole of your foot. Sciatica is a symptom of another medical condition. For instance, nerve damage or certain conditions, such as   a herniated disk or bone spur on the spine, pinch or put pressure on the sciatic nerve. This causes the pain, weakness, or other sensations normally associated with sciatica. Generally, sciatica only affects one side of the body. °CAUSES  °· Herniated or slipped disc. °· Degenerative disk disease. °· A pain disorder involving the narrow muscle in the buttocks (piriformis syndrome). °· Pelvic injury or fracture. °· Pregnancy. °· Tumor (rare). °SYMPTOMS  °Symptoms can vary from mild to very severe. The symptoms usually travel from the low back to the buttocks and down the back of the leg. Symptoms can include: °· Mild tingling or dull aches in the lower back, leg, or hip. °· Numbness in the back of the calf or sole of the foot. °· Burning sensations in the lower back, leg, or hip. °· Sharp pains in the lower back, leg, or hip. °· Leg weakness. °· Severe back pain inhibiting movement. °These symptoms may get worse with coughing, sneezing, laughing, or prolonged  sitting or standing. Also, being overweight may worsen symptoms. °DIAGNOSIS  °Your caregiver will perform a physical exam to look for common symptoms of sciatica. He or she may ask you to do certain movements or activities that would trigger sciatic nerve pain. Other tests may be performed to find the cause of the sciatica. These may include: °· Blood tests. °· X-rays. °· Imaging tests, such as an MRI or CT scan. °TREATMENT  °Treatment is directed at the cause of the sciatic pain. Sometimes, treatment is not necessary and the pain and discomfort goes away on its own. If treatment is needed, your caregiver may suggest: °· Over-the-counter medicines to relieve pain. °· Prescription medicines, such as anti-inflammatory medicine, muscle relaxants, or narcotics. °· Applying heat or ice to the painful area. °· Steroid injections to lessen pain, irritation, and inflammation around the nerve. °· Reducing activity during periods of pain. °· Exercising and stretching to strengthen your abdomen and improve flexibility of your spine. Your caregiver may suggest losing weight if the extra weight makes the back pain worse. °· Physical therapy. °· Surgery to eliminate what is pressing or pinching the nerve, such as a bone spur or part of a herniated disk. °HOME CARE INSTRUCTIONS  °· Only take over-the-counter or prescription medicines for pain or discomfort as directed by your caregiver. °· Apply ice to the affected area for 20 minutes, 3-4 times a day for the first 48-72 hours. Then try heat in the same way. °· Exercise, stretch, or perform your usual activities if these do not aggravate your pain. °· Attend physical therapy sessions as directed by your caregiver. °· Keep all follow-up appointments as directed by your caregiver. °· Do not wear high heels or shoes that do not provide proper support. °· Check your mattress to see if it is too soft. A firm mattress may lessen your pain and discomfort. °SEEK IMMEDIATE MEDICAL CARE IF:   °· You lose control of your bowel or bladder (incontinence). °· You have increasing weakness in the lower back, pelvis, buttocks, or legs. °· You have redness or swelling of your back. °· You have a burning sensation when you urinate. °· You have pain that gets worse when you lie down or awakens you at night. °· Your pain is worse than you have experienced in the past. °· Your pain is lasting longer than 4 weeks. °· You are suddenly losing weight without reason. °MAKE SURE YOU: °· Understand these instructions. °· Will watch your condition. °· Will get   help right away if you are not doing well or get worse. °Document Released: 12/24/2000 Document Revised: 07/01/2011 Document Reviewed: 05/11/2011 °ExitCare® Patient Information ©2015 [employer]. This information is not intended to replace advice given to you by your health care provider. Make sure you discuss any questions you have with your health care provider. ° °

## 2014-04-27 NOTE — ED Notes (Signed)
Back pain sharp radiating down right leg. Denies injury.

## 2014-04-27 NOTE — ED Provider Notes (Signed)
CSN: [id number]     Arrival date & time 04/27/14  1214 History  This chart was scribed for non-physician practitioner, Will Cleburne Savini, PA-C, working with Blake Divine, MD, by Ronney Lion, ED Scribe. This patient was seen in room WTR8/WTR8 and the patient's care was started at 1:26 PM.    Chief Complaint  Patient presents with  . Back Pain   The history is provided by the patient. No language interpreter was used.     HPI Comments: Denise Gillespie is a 24 y.o. female who presents to the Emergency Department complaining of constant, worsening, sharp, 9/10 bilateral lower back pain that began about 3-4 days ago when she woke up. She reports the pain shoots down the posterior side of her right leg to the back of her knee.  She denies any known trauma or injury. Movement makes the pain worse. She sits in a chair for work. Patient took OTC NSAID's, which alleviated her pain "a little." She denies a history of GI problems. She denies numbness, tingling, or weakness; bladder or bowel incontinence, difficulty urinating, hematuria, dysuria, urinary frequency, urgency, or frequency; vaginal bleeding, vaginal discharge; fever, chills; abdominal pain or neck pain. She uses Implanon for birth control.  She also denies a history of cancer or IV drug use. PCP Dr. Claiborne Billings    Past Medical History  Diagnosis Date  . Gestational hypertension     Followed by [facility] for pregnancy  . Anemia, iron deficiency   . Obesity   . Abscess     multiple   Past Surgical History  Procedure Laterality Date  . Cesarean section    . Incise and drain abcess     Family History  Problem Relation Age of Onset  . Obesity Mother   . Heart disease Mother   . Hypertension Mother   . Migraines Mother   . Eczema Brother    History  Substance Use Topics  . Smoking status: Current Some Day Smoker -- 0.50 packs/day    Types: Cigarettes  . Smokeless tobacco: Never Used  . Alcohol Use: No   OB History    No data available      Review of Systems  Constitutional: Negative for fever and chills.  Gastrointestinal: Negative for vomiting and abdominal pain.  Genitourinary: Negative for dysuria, urgency, frequency, hematuria, flank pain, vaginal bleeding, vaginal discharge, difficulty urinating, genital sores, menstrual problem and pelvic pain.  Musculoskeletal: Positive for back pain (bilateral lower back pain). Negative for myalgias, gait problem and neck pain.  Skin: Negative for rash.  Neurological: Negative for weakness, light-headedness, numbness and headaches.    Allergies  Review of patient's allergies indicates no known allergies.  Home Medications   Prior to Admission medications   Medication Sig Start Date End Date Taking? Authorizing Provider  methocarbamol (ROBAXIN) 500 MG tablet Take 1 tablet (500 mg total) by mouth 2 (two) times daily as needed for muscle spasms. 04/27/14   Everlene Farrier, PA-C  naproxen (NAPROSYN) 500 MG tablet Take 1 tablet (500 mg total) by mouth 2 (two) times daily with a meal. 04/27/14   Everlene Farrier, PA-C   BP 128/76 mmHg  Pulse 94  Temp(Src) 98.5 F (36.9 C) (Oral)  Resp 20  SpO2 100% Physical Exam  Constitutional: She is oriented to person, place, and time. She appears well-developed and well-nourished. No distress.  Nontoxic appearing.  HENT:  Head: Normocephalic and atraumatic.  Right Ear: External ear normal.  Left Ear: External ear normal.  Mouth/Throat: No  oropharyngeal exudate.  Eyes: Pupils are equal, round, and reactive to light. Right eye exhibits no discharge. Left eye exhibits no discharge.  Neck: Neck supple. No JVD present.  Cardiovascular: Normal rate, regular rhythm, normal heart sounds and intact distal pulses.   Pulses:      Dorsalis pedis pulses are 2+ on the right side, and 2+ on the left side.       Posterior tibial pulses are 2+ on the right side, and 2+ on the left side.  Bilateral pedal pulses intact.  Pulmonary/Chest: Effort normal and  breath sounds normal. No respiratory distress.  Abdominal: Soft. She exhibits no distension. There is no tenderness.  Musculoskeletal: Normal range of motion. She exhibits tenderness. She exhibits no edema.  Tenderness to right lower back. No midline tenderness. No edema, deformity, or erythema. No lower extremity edema, deformity, or erythema. The patient is able to ambulate without difficulty or assistance.  Lymphadenopathy:    She has no cervical adenopathy.  Neurological: She is alert and oriented to person, place, and time. She has normal reflexes. She displays normal reflexes. Coordination normal.  Reflex Scores:      Patellar reflexes are 2+ on the right side and 2+ on the left side. Able to ambulate without difficulty or assistance. Antalgic gait. Bilateral patellar DTR's intact.  Skin: Skin is warm and dry. No rash noted. She is not diaphoretic. No erythema. No pallor.  Psychiatric: She has a normal mood and affect. Her behavior is normal.  Nursing note and vitals reviewed.   ED Course  Procedures (including critical care time)  DIAGNOSTIC STUDIES: Oxygen Saturation is 99% on room air, normal by my interpretation.    COORDINATION OF CARE: 1:35 PM - Suspect sciatic nerve pain. Discussed treatment plan with pt at bedside which includes Rx Naprosyn , and pt agreed to plan.  MDM   Meds given in ED:  Medications  acetaminophen (TYLENOL) tablet 650 mg (650 mg Oral Given 04/27/14 1353)    Discharge Medication List as of 04/27/2014  1:39 PM    START taking these medications   Details  methocarbamol (ROBAXIN) 500 MG tablet Take 1 tablet (500 mg total) by mouth 2 (two) times daily as needed for muscle spasms., Starting 04/27/2014, Until Discontinued, Print    naproxen (NAPROSYN) 500 MG tablet Take 1 tablet (500 mg total) by mouth 2 (two) times daily with a meal., Starting 04/27/2014, Until Discontinued, Print        Final diagnoses:  Right-sided low back pain with right-sided  sciatica   this is a 24 year old female who presents to the emergency department complaining of right low back pain that radiates down her right posterior buttocks into her right posterior leg. She reports the pain is sharp. She reports her pain is worse with movement. The patient reports she sits frequently at work. The patient denies urinary symptoms. Patient with back pain with right sciatica. She denies trauma to her back..  No neurological deficits and normal neuro exam.  Patient can walk but states is painful.  No loss of bowel or bladder control.  No concern for cauda equina.  No fever, night sweats, weight loss, h/o cancer, IVDU.  RICE protocol and pain medicine indicated and discussed with patient. I advised the patient to follow-up with their primary care provider this week. I advised the patient to return to the emergency department with new or worsening symptoms or new concerns. The patient verbalized understanding and agreement with plan.   I personally performed  the services described in this documentation, which was scribed in my presence. The recorded information has been reviewed and is accurate.      Everlene FarrierWilliam Donye Campanelli, PA-C 04/27/14 1954  Blake DivineJohn Wofford, MD 04/28/14 [phone]

## 2015-07-11 ENCOUNTER — Emergency Department (HOSPITAL_COMMUNITY)
Admission: EM | Admit: 2015-07-11 | Discharge: 2015-07-12 | Disposition: A | Discharge status: Home / Self Care | Payer: Self-pay | Attending: Emergency Medicine | Admitting: Emergency Medicine

## 2015-07-11 ENCOUNTER — Encounter (HOSPITAL_COMMUNITY): Payer: Self-pay | Admitting: Emergency Medicine

## 2015-07-11 DIAGNOSIS — L02212 Cutaneous abscess of back [any part, except buttock]: Secondary | ICD-10-CM | POA: Insufficient documentation

## 2015-07-11 DIAGNOSIS — Z791 Long term (current) use of non-steroidal anti-inflammatories (NSAID): Secondary | ICD-10-CM | POA: Insufficient documentation

## 2015-07-11 DIAGNOSIS — F17210 Nicotine dependence, cigarettes, uncomplicated: Secondary | ICD-10-CM | POA: Insufficient documentation

## 2015-07-11 NOTE — ED Notes (Signed)
Pt states that she has a recurrent abscess on her R back. States sometimes it drains on its own but not this time. States that it started coming back 2 days ago. Alert and oriented.

## 2015-07-12 ENCOUNTER — Telehealth (HOSPITAL_BASED_OUTPATIENT_CLINIC_OR_DEPARTMENT_OTHER): Payer: Self-pay | Admitting: Emergency Medicine

## 2015-07-12 LAB — PREGNANCY, URINE: Preg Test, Ur: NEGATIVE

## 2015-07-12 MED ORDER — ONDANSETRON 8 MG PO TBDP
8.0000 mg | ORAL_TABLET | Freq: Once | ORAL | Status: AC
  Administered 2015-07-12: 8 mg via ORAL
  Filled 2015-07-12: qty 1

## 2015-07-12 MED ORDER — DOXYCYCLINE HYCLATE 100 MG PO TABS
100.0000 mg | ORAL_TABLET | Freq: Once | ORAL | Status: AC
  Administered 2015-07-12: 100 mg via ORAL
  Filled 2015-07-12: qty 1

## 2015-07-12 MED ORDER — HYDROCODONE-ACETAMINOPHEN 5-325 MG PO TABS: 1.0000 | ORAL_TABLET | Freq: Four times a day (QID) | ORAL | Status: DC | PRN

## 2015-07-12 MED ORDER — DOXYCYCLINE HYCLATE 100 MG PO CAPS: 100.0000 mg | ORAL_CAPSULE | Freq: Two times a day (BID) | ORAL | Status: DC

## 2015-07-12 MED ORDER — HYDROMORPHONE HCL 2 MG/ML IJ SOLN
2.0000 mg | Freq: Once | INTRAMUSCULAR | Status: AC
  Administered 2015-07-12: 2 mg via INTRAMUSCULAR
  Filled 2015-07-12: qty 1

## 2015-07-12 MED ORDER — LIDOCAINE-EPINEPHRINE 2 %-1:100000 IJ SOLN
20.0000 mL | Freq: Once | INTRAMUSCULAR | Status: AC
  Administered 2015-07-12: 20 mL via INTRADERMAL
  Filled 2015-07-12: qty 1

## 2015-07-12 NOTE — ED Provider Notes (Addendum)
CSN: [id number]     Arrival date & time 07/11/15  2232 History  By signing my name below, I, Emmanuella Mensah, attest that this documentation has been prepared under the direction and in the presence of Paula LibraJohn Charleston Vierling, MD. Electronically Signed: Angelene GiovanniEmmanuella Mensah, ED Scribe. 07/12/2015. 1:07 AM.    Chief Complaint  Patient presents with  . Abscess   The history is provided by the patient. No language interpreter was used.   HPI Comments: Denise Gillespie is a 25 y.o. female with a hx of recurrent abscesses who presents to the Emergency Department complaining of a gradually worsening moderately to severely painful painful area of swelling to her right mid back onset 3 days ago. She has had abscesses at this site in the past. She reports episodes of nausea and diarrhea recently. Pain is worse with movement or palpation. It has not drained spontaneously. She has tried using a heating pad with no relief. She denies any fever, chills, abdominal pain, or vomiting.    Past Medical History  Diagnosis Date  . Gestational hypertension     [facility]  . Anemia, iron deficiency   . Obesity   . Abscess     multiple   Past Surgical History  Procedure Laterality Date  . Cesarean section    . Incise and drain abcess     Family History  Problem Relation Age of Onset  . Obesity Mother   . Heart disease Mother   . Hypertension Mother   . Migraines Mother   . Eczema Brother    Social History  Substance Use Topics  . Smoking status: Current Some Day Smoker -- 0.50 packs/day    Types: Cigarettes  . Smokeless tobacco: Never Used  . Alcohol Use: No   OB History    No data available     Review of Systems  A complete 10 system review of systems was obtained and all systems are negative except as noted in the HPI and PMH.    Allergies  Review of patient's allergies indicates no known allergies.  Home Medications   Prior to Admission medications   Medication Sig Start  Date End Date Taking? Authorizing Provider  methocarbamol (ROBAXIN) 500 MG tablet Take 1 tablet (500 mg total) by mouth 2 (two) times daily as needed for muscle spasms. 04/27/14   Everlene FarrierWilliam Dansie, PA-C  naproxen (NAPROSYN) 500 MG tablet Take 1 tablet (500 mg total) by mouth 2 (two) times daily with a meal. 04/27/14   Everlene FarrierWilliam Dansie, PA-C   BP 181/98 mmHg  Pulse 107  Temp(Src) 98.2 F (36.8 C) (Oral)  Resp 18  Ht 5\' 4"  (1.626 m)  SpO2 97%   Physical Exam  Nursing note and vitals reviewed. General: Well-developed, obese female in no acute distress; appearance consistent with age of record HENT: normocephalic; atraumatic Eyes: Normal appearance Neck: supple Heart: regular rate and rhythm Lungs: clear to auscultation bilaterally Abdomen: soft; nondistended; nontender Extremities: No deformity; full range of motion; pulses normal Neurologic: Awake, alert and oriented; motor function intact in all extremities and symmetric; no facial droop Skin: Warm and dry; large tender swollen region of the right mid back with central pointing:      Psychiatric: Normal mood and affect  ED Course  Procedures (including critical care time) DIAGNOSTIC STUDIES: Oxygen Saturation is 97% on RA, normal by my interpretation.    COORDINATION OF CARE: 1:04 AM- Pt advised of plan for treatment and pt agrees. Pt will receive  I&D.   INCISION AND DRAINAGE Performed by: Paula LibraMOLPUS,Alonnie Bieker L Consent: Verbal consent obtained. Risks and benefits: risks, benefits and alternatives were discussed Type: abscess  Body area: Right mid back  Anesthesia: local infiltration  Incision was made with a scalpel.  Local anesthetic: lidocaine 2 % with epinephrine  Anesthetic total: 7 ml  Complexity: complex Blunt dissection to break up loculations  Drainage: purulent  Drainage amount: Copious   Packing material: 1/2 in iodoform gauze  Patient tolerance: Patient tolerated the procedure well with no immediate  complications.     MDM  Nursing notes and vitals signs, including pulse oximetry, reviewed.  Summary of this visit's results, reviewed by myself:  Labs:  Results for orders placed or performed during the hospital encounter of 07/11/15 (from the past 24 hour(s))  Pregnancy, urine     Status: None   Collection Time: 07/12/15  1:50 AM  Result Value Ref Range   Preg Test, Ur NEGATIVE NEGATIVE   Patient was advised to return in 72 hours for recheck. Will place her on doxycycline given her history of recurrence. 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  Final diagnoses:  Abscess of back   I personally performed the services described in this documentation, which was scribed in my presence. The recorded information has been reviewed and is accurate.    Paula LibraJohn Fantasia Jinkins, MD 07/12/15 [phone]  Paula LibraJohn Mashelle Busick, MD 07/12/15 [phone]  Paula LibraJohn Sedalia Greeson, MD 07/12/15 [phone]

## 2016-05-08 ENCOUNTER — Inpatient Hospital Stay (HOSPITAL_COMMUNITY)
Admission: AD | Admit: 2016-05-08 | Discharge: 2016-05-08 | Disposition: A | Discharge status: Home / Self Care | Payer: Managed Care, Other (non HMO) | Source: Ambulatory Visit | Attending: Obstetrics & Gynecology | Admitting: Obstetrics & Gynecology

## 2016-05-08 DIAGNOSIS — R110 Nausea: Secondary | ICD-10-CM | POA: Diagnosis present

## 2016-05-08 DIAGNOSIS — A084 Viral intestinal infection, unspecified: Secondary | ICD-10-CM | POA: Diagnosis not present

## 2016-05-08 DIAGNOSIS — R109 Unspecified abdominal pain: Secondary | ICD-10-CM | POA: Diagnosis present

## 2016-05-08 DIAGNOSIS — E669 Obesity, unspecified: Secondary | ICD-10-CM | POA: Insufficient documentation

## 2016-05-08 DIAGNOSIS — D509 Iron deficiency anemia, unspecified: Secondary | ICD-10-CM | POA: Insufficient documentation

## 2016-05-08 DIAGNOSIS — F17210 Nicotine dependence, cigarettes, uncomplicated: Secondary | ICD-10-CM | POA: Insufficient documentation

## 2016-05-08 LAB — URINALYSIS, ROUTINE W REFLEX MICROSCOPIC
BILIRUBIN URINE: NEGATIVE
Glucose, UA: NEGATIVE mg/dL
Hgb urine dipstick: NEGATIVE
KETONES UR: NEGATIVE mg/dL
LEUKOCYTES UA: NEGATIVE
NITRITE: NEGATIVE
PH: 6 (ref 5.0–8.0)
Protein, ur: NEGATIVE mg/dL
SPECIFIC GRAVITY, URINE: 1.024 (ref 1.005–1.030)

## 2016-05-08 LAB — POCT PREGNANCY, URINE: PREG TEST UR: NEGATIVE

## 2016-05-08 NOTE — Discharge Instructions (Signed)

## 2016-05-08 NOTE — MAU Provider Note (Signed)
History     CSN: [id number]  Arrival date and time: 05/08/16 1742   None     Chief Complaint  Patient presents with  . Abdominal Pain  . Nausea   Patient is a 26 year old who presents to MA U with concerns for pregnancy. She reports she has a Implanon in that expired in 2014. She has been having unprotected sex. She reports that over the past 2 weeks she's had 3-4 episodes of vomiting after meals as well as an episode of diarrhea or 2 a day. She has no sick contacts. She reports no severe abdominal pain. She denies fevers or chills. She was just concerned with the vomiting and diarrhea and the expired implanon that she could be pregnant. She does report that she is able to keep most liquids down and at least 1 meal of solids daily. She does report her symptoms are worst when she smokes. Additionally she has some lightheadedness when she smokes.    OB History    No data available      Past Medical History:  Diagnosis Date  . Abscess    multiple  . Anemia, iron deficiency   . Gestational hypertension    Followed by [facility] for pregnancy  . Obesity     Past Surgical History:  Procedure Laterality Date  . CESAREAN SECTION    . INCISE AND DRAIN ABCESS      Family History  Problem Relation Age of Onset  . Obesity Mother   . Heart disease Mother   . Hypertension Mother   . Migraines Mother   . Eczema Brother     Social History  Substance Use Topics  . Smoking status: Current Some Day Smoker    Packs/day: 0.50    Types: Cigarettes  . Smokeless tobacco: Never Used  . Alcohol use No    Allergies: No Known Allergies  Prescriptions Prior to Admission  Medication Sig Dispense Refill Last Dose  . doxycycline (VIBRAMYCIN) 100 MG capsule Take 1 capsule (100 mg total) by mouth 2 (two) times daily. 20 capsule 0   . HYDROcodone-acetaminophen (NORCO) 5-325 MG tablet Take 1-2 tablets by mouth every 6 (six) hours as needed (for pain). 10 tablet 0   . methocarbamol (ROBAXIN) 500  MG tablet Take 1 tablet (500 mg total) by mouth 2 (two) times daily as needed for muscle spasms. 20 tablet 0   . naproxen (NAPROSYN) 500 MG tablet Take 1 tablet (500 mg total) by mouth 2 (two) times daily with a meal. 30 tablet 0     Review of Systems  Constitutional: Negative for chills and fever.  HENT: Negative for congestion and rhinorrhea.   Gastrointestinal: Positive for abdominal pain, diarrhea, nausea and vomiting. Negative for abdominal distention and constipation.  Genitourinary: Negative for difficulty urinating, enuresis and frequency.  Neurological: Positive for dizziness. Negative for weakness.   Physical Exam   Blood pressure 130/86, pulse 77, temperature 98.6 F (37 C), temperature source Oral, resp. rate 18, height  (1.575 m), weight 253 lb 0.6 oz (114.8 kg).  Physical Exam  Constitutional: She is oriented to person, place, and time. She appears well-developed and well-nourished.  HENT:  Head: Normocephalic and atraumatic.  Eyes: Conjunctivae are normal. Pupils are equal, round, and reactive to light.  Cardiovascular: Normal rate.   Respiratory: Effort normal. No respiratory distress.  GI: Soft. She exhibits no distension. There is no tenderness. There is no rebound and no guarding.  Musculoskeletal: Normal range of motion. She exhibits  no edema.  Neurological: She is alert and oriented to person, place, and time. No cranial nerve deficit.  Skin: Skin is warm and dry.  Psychiatric: She has a normal mood and affect. Her behavior is normal.    MAU Course  Procedures  MDM In MA U patient underwent pregnancy test which was negative.  Assessment and Plan  #1: Patient with likely viral gastroenteritis severity. Patient is not pregnant. Recommended follow up with PCP at [facility] if symptoms do not improve significantly over the next 24-48 hours. Patient voiced understanding.  Ernestina Penna 05/08/2016, 7:06 PM

## 2016-05-08 NOTE — MAU Note (Signed)
Pt has had sharp pains in her abd and some nausea for several weeks. Implanon expired 2014. Feels like pregnancy symptom. Not had period since Implanon in in 2011.

## 2017-05-25 ENCOUNTER — Other Ambulatory Visit: Payer: Self-pay

## 2017-05-25 ENCOUNTER — Encounter: Payer: Self-pay | Admitting: Family Medicine

## 2017-05-25 ENCOUNTER — Ambulatory Visit (INDEPENDENT_AMBULATORY_CARE_PROVIDER_SITE_OTHER): Payer: Managed Care, Other (non HMO) | Admitting: Family Medicine

## 2017-05-25 VITALS — BP 118/88 | HR 101 | Temp 100.0°F | Ht 62.0 in | Wt 261.0 lb

## 2017-05-25 DIAGNOSIS — L732 Hidradenitis suppurativa: Secondary | ICD-10-CM | POA: Insufficient documentation

## 2017-05-25 DIAGNOSIS — F339 Major depressive disorder, recurrent, unspecified: Secondary | ICD-10-CM | POA: Insufficient documentation

## 2017-05-25 DIAGNOSIS — F322 Major depressive disorder, single episode, severe without psychotic features: Secondary | ICD-10-CM | POA: Diagnosis not present

## 2017-05-25 DIAGNOSIS — F17200 Nicotine dependence, unspecified, uncomplicated: Secondary | ICD-10-CM | POA: Insufficient documentation

## 2017-05-25 DIAGNOSIS — Z309 Encounter for contraceptive management, unspecified: Secondary | ICD-10-CM | POA: Insufficient documentation

## 2017-05-25 DIAGNOSIS — J069 Acute upper respiratory infection, unspecified: Secondary | ICD-10-CM

## 2017-05-25 DIAGNOSIS — Z0000 Encounter for general adult medical examination without abnormal findings: Secondary | ICD-10-CM

## 2017-05-25 DIAGNOSIS — F411 Generalized anxiety disorder: Secondary | ICD-10-CM

## 2017-05-25 NOTE — Assessment & Plan Note (Signed)
Patient needs a pap smear at her next visit, especially given her abnormal result eight years ago.

## 2017-05-25 NOTE — Assessment & Plan Note (Signed)
Constellation of symptoms most consistent with viral infection rather than strep throat.  Instructed patient to use Tylenol for symptom relief and return to clinic if symptoms are not improving by the end of the week.

## 2017-05-25 NOTE — Assessment & Plan Note (Signed)
Patient appears to have hydradenitis suppurativa, although it is unusual to get lesions on the back.  Encouraged patient to return to clinic if she develops a new lesions, since we can prescribe doxycycline and possibly drain it.  Advised patient that there are no easy ways to prevent lesions from occurring unfortunately.

## 2017-05-25 NOTE — Assessment & Plan Note (Signed)
Discussed with patient that we need to address her depression and anxiety soon and consider medication for her mental health at that time.  Also advised her about integrated health at the clinic.

## 2017-05-25 NOTE — Progress Notes (Signed)
Subjective:    Denise Gillespie - 27 y.o. female MRN [id number]  Date of birth: [birth year]  HPI  Denise Gillespie is here to establish care with a new physician.  Her concerns today include: sore throat and history of multiple abscesses that are painful and prevent her from going to work.   Sore throat Subjective fevers, body aches, sore throat with pain during swallowing, congestion, minimal cough, nausea, diarrhea, with all symptoms starting yesterday.  Has not tried anything yet for relief.   PMH: depression and anxiety, although she has never been on medications or hospitalized for this, preeclampsia  PSH: c-section x 1  Medications: none  FH: anxiety and depression, type 2 diabetes, heart disease in paternal aunt and paternal grandparents, maternal GM with CHF, mother with prediabetes  Social: works full time at [employer], lives with two daughters, has female sexual partners  Health Maintenance:  - last pap smear was when she was pregnant (eight years ago); was abnormal at that time Health Maintenance Due  Topic Date Due  . PAP SMEAR  02/02/2012    -  reports that she has been smoking cigarettes.  She has been smoking about 0.50 packs per day. She has never used smokeless tobacco.  Has smoked for 14 years.  Social, infrequent alcohol use, no illicit drug use. - Review of Systems: Per HPI. - Past Medical History: Patient Active Problem List   Diagnosis Date Noted  . Hydradenitis 05/25/2017  . Acute upper respiratory infection 05/25/2017  . Tobacco use disorder 05/25/2017  . Current severe episode of major depressive disorder without psychotic features (HCC) 05/25/2017  . Generalized anxiety disorder 05/25/2017  . Healthcare maintenance 05/25/2017  . Cellulitis 10/01/2010  . Sore throat 03/04/2010  . PAP SMEAR, ABNORMAL, ASCUS 07/23/2009  . OBESITY, CLASS III 03/21/2009  . ANEMIA, IRON DEFICIENCY 12/17/2007  . HYPERTENSION, GESTATIONAL 04/13/2007   -  Medications: reviewed and updated   Objective:   Physical Exam BP 118/88 (BP Location: Right Arm, Patient Position: Sitting, Cuff Size: Large)   Pulse (!) 101   Temp 100 F (37.8 C) (Oral)   Ht  (1.575 m)   Wt 261 lb (118.4 kg)   SpO2 94%   BMI 47.74 kg/m  Gen: NAD, alert, cooperative with exam HEENT: NCAT, PERRL, clear conjunctiva, oropharynx erythematous but without pharyngeal exudate, no rhinorrhea, supple neck, sounds congested CV: Regular rhythm but slightly tachycardic, good S1/S2, no murmur, no edema Resp: CTABL, no wheezes, non-labored Abd: SNTND, BS present, no guarding or organomegaly Skin: multiple areas of resolving nodules on back, axillae, and under breasts; no actively inflamed lesions Neuro: no gross deficits.  Psych: good insight, alert and oriented   Depression screen PHQ 2/9 05/25/2017  Decreased Interest 2  Down, Depressed, Hopeless 2  PHQ - 2 Score 4  Altered sleeping 3  Tired, decreased energy 3  Change in appetite 2  Feeling bad or failure about yourself  1  Trouble concentrating 3  Moving slowly or fidgety/restless 3  Suicidal thoughts 0  PHQ-9 Score 19  Difficult doing work/chores Very difficult   GAD 7 : Generalized Anxiety Score 05/25/2017  Nervous, Anxious, on Edge 2  Control/stop worrying 3  Worry too much - different things 3  Trouble relaxing 3  Restless 3  Easily annoyed or irritable 2  Afraid - awful might happen 3  Total GAD 7 Score 19  Anxiety Difficulty Extremely difficult          Assessment &  Plan:   Hydradenitis Patient appears to have hydradenitis suppurativa, although it is unusual to get lesions on the back.  Encouraged patient to return to clinic if she develops a new lesions, since we can prescribe doxycycline and possibly drain it.  Advised patient that there are no easy ways to prevent lesions from occurring unfortunately.  Acute upper respiratory infection Constellation of symptoms most consistent with viral  infection rather than strep throat.  Instructed patient to use Tylenol for symptom relief and return to clinic if symptoms are not improving by the end of the week.  Tobacco use disorder Needs counseling during future visit.  Current severe episode of major depressive disorder without psychotic features [facility]) Discussed with patient that we need to address her depression and anxiety soon and consider medication for her mental health at that time.  Also advised her about integrated health at the clinic.  Generalized anxiety disorder Same as above.  An SSRI is likely the next best step.  Healthcare maintenance Patient needs a pap smear at her next visit, especially given her abnormal result eight years ago.    Lezlie Octave, M.D. 05/25/2017, 2:12 PM PGY-1, [facility]

## 2017-05-25 NOTE — Patient Instructions (Signed)
It was nice meeting you today Denise Gillespie!  If you have any questions or concerns, please feel free to call the clinic.   Be well,  Dr. Frances Furbish

## 2017-05-25 NOTE — Assessment & Plan Note (Signed)
Same as above.  An SSRI is likely the next best step.

## 2017-05-25 NOTE — Assessment & Plan Note (Signed)
Needs counseling during future visit.

## 2017-06-09 ENCOUNTER — Ambulatory Visit: Payer: Managed Care, Other (non HMO) | Admitting: Family Medicine

## 2017-06-10 ENCOUNTER — Ambulatory Visit (INDEPENDENT_AMBULATORY_CARE_PROVIDER_SITE_OTHER): Payer: Managed Care, Other (non HMO) | Admitting: Family Medicine

## 2017-06-10 VITALS — BP 115/80 | HR 80 | Temp 99.0°F | Wt 262.4 lb

## 2017-06-10 DIAGNOSIS — F322 Major depressive disorder, single episode, severe without psychotic features: Secondary | ICD-10-CM | POA: Diagnosis not present

## 2017-06-10 DIAGNOSIS — Z0000 Encounter for general adult medical examination without abnormal findings: Secondary | ICD-10-CM

## 2017-06-10 DIAGNOSIS — F411 Generalized anxiety disorder: Secondary | ICD-10-CM | POA: Diagnosis not present

## 2017-06-10 DIAGNOSIS — L732 Hidradenitis suppurativa: Secondary | ICD-10-CM

## 2017-06-10 MED ORDER — FLUOXETINE HCL 10 MG PO CAPS: 10.0000 mg | ORAL_CAPSULE | Freq: Every day | ORAL | 3 refills | Status: DC

## 2017-06-10 MED ORDER — TRAZODONE HCL 50 MG PO TABS: 50.0000 mg | ORAL_TABLET | Freq: Every day | ORAL | 11 refills | Status: DC

## 2017-06-10 NOTE — Patient Instructions (Signed)
It was nice seeing you today Denise Gillespie!  Today, we started two medications today - Trazodone to help you sleep, which you'll once each night, and Prozac, which you will take once per day.  Please schedule an appointment to meet with integrated health at your convenience.  I would like to see you back in 1-2 months or earlier if you would like. If you have any questions or concerns, please feel free to call the clinic.   Be well,  Dr. Frances Furbish
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
## 2017-06-11 ENCOUNTER — Encounter: Payer: Self-pay | Admitting: Family Medicine

## 2017-06-11 ENCOUNTER — Telehealth: Payer: Self-pay | Admitting: Family Medicine

## 2017-06-11 NOTE — Assessment & Plan Note (Signed)
Patient has severe generalized anxiety and may also have a component of OCD due to her need to repeatedly check on windows and doors.  Will prescribe Prozac 10 mg daily and trazodone 50 mg QHS to help with her anxiety and sleep.  Can evaluate for addition of Buspar in the future.

## 2017-06-11 NOTE — Assessment & Plan Note (Signed)
Will fill out FMLA form.  Also discussed that patient may need antibiotics to prevent future flare ups and instructed her to call if she is getting a new lesion.

## 2017-06-11 NOTE — Assessment & Plan Note (Signed)
Will start Prozac 10 mg daily.  Would like patient to see me in about one month to evaluate how medication is working.  Also encouraged her to make an appointment with integrated health.

## 2017-06-11 NOTE — Telephone Encounter (Signed)
Pt left FMLA paperwork with dr Frances Furbish at her visit yesterday. She would like to pick up the completed forms tomorrow Friday May 31. This forms are very time sensitive. Please advise

## 2017-06-11 NOTE — Assessment & Plan Note (Signed)
Will perform pap at next visit.

## 2017-06-12 NOTE — Telephone Encounter (Signed)
Spoke with Dr. Frances Furbish and she stated that the paper work was complete and had been placed up front for Denise Gillespie to pick up. Tried to contact Denise Gillespie to inform her of below and her mailbox is full so I was unable to LVM.  If she calls back please inform her of below. Lamonte Sakai, Wendelin Reader D, [place]

## 2017-06-22 ENCOUNTER — Ambulatory Visit: Payer: Managed Care, Other (non HMO)

## 2017-06-29 ENCOUNTER — Ambulatory Visit: Payer: Managed Care, Other (non HMO)

## 2017-07-09 ENCOUNTER — Ambulatory Visit: Payer: Managed Care, Other (non HMO) | Admitting: Family Medicine

## 2017-08-13 ENCOUNTER — Ambulatory Visit: Payer: Managed Care, Other (non HMO) | Admitting: Family Medicine

## 2017-08-18 ENCOUNTER — Ambulatory Visit: Payer: Managed Care, Other (non HMO) | Admitting: Family Medicine

## 2017-09-07 ENCOUNTER — Encounter: Payer: Self-pay | Admitting: Family Medicine

## 2017-09-07 ENCOUNTER — Ambulatory Visit (INDEPENDENT_AMBULATORY_CARE_PROVIDER_SITE_OTHER): Payer: Managed Care, Other (non HMO) | Admitting: Family Medicine

## 2017-09-07 ENCOUNTER — Other Ambulatory Visit: Payer: Self-pay

## 2017-09-07 VITALS — BP 118/70 | HR 68 | Temp 98.6°F | Ht 62.0 in | Wt 253.0 lb

## 2017-09-07 DIAGNOSIS — F411 Generalized anxiety disorder: Secondary | ICD-10-CM

## 2017-09-07 DIAGNOSIS — F332 Major depressive disorder, recurrent severe without psychotic features: Secondary | ICD-10-CM

## 2017-09-07 DIAGNOSIS — Z3009 Encounter for other general counseling and advice on contraception: Secondary | ICD-10-CM | POA: Diagnosis not present

## 2017-09-07 DIAGNOSIS — Z0000 Encounter for general adult medical examination without abnormal findings: Secondary | ICD-10-CM

## 2017-09-07 MED ORDER — SERTRALINE HCL 50 MG PO TABS: 50.0000 mg | ORAL_TABLET | Freq: Every day | ORAL | 3 refills | Status: DC

## 2017-09-07 MED ORDER — BUSPIRONE HCL 7.5 MG PO TABS: 7.5000 mg | ORAL_TABLET | Freq: Two times a day (BID) | ORAL | 2 refills | Status: DC

## 2017-09-07 NOTE — Patient Instructions (Signed)
It was nice seeing you today Ms. Denise Gillespie!  Please take Zoloft one pill daily and Buspar one pill twice per daily for 2-3 days, then two pills twice per day after that.  Please make an appointment to see integrated care today.  I would like to see you back in 1-2 months to check on how things are going.  If you have any questions or concerns, please feel free to call the clinic.   Be well,  Dr. Frances FurbishWinfrey

## 2017-09-07 NOTE — Progress Notes (Signed)
Subjective:    Denise Gillespie - 27 y.o. female MRN [id number]  Date of birth: 10/13/1990  HPI  Denise Gillespie is here for discussion of her mood.  She would also like to get her Nexplanon removed, since it has been in place since 2011 and her arm is a little sore in that area.  Anxiety and depression - Patient says that she continues to struggle with significant anxiety and will often see shadows, which exacerbate her anxiety - Feels unable to be strong for her children, which is especially difficult for her -Is planning to move, but is overwhelmed by the process and has not gotten started with this - Sometimes she will not get out of bed for most of the day - Will avoid interaction with people in order to reduce her anxiety - Was taking Prozac for 2 months, but it only increased her restlessness and anxiety, so she has not taken it for the past month -Has taken trazodone before bed, which does help her sleep  Health Maintenance:  Health Maintenance Due  Topic Date Due  . PAP SMEAR  02/02/2012    -  reports that she has been smoking cigarettes. She has been smoking about 0.25 packs per day. She has never used smokeless tobacco. - Review of Systems: Per HPI. - Past Medical History: Patient Active Problem List   Diagnosis Date Noted  . Healthcare maintenance 09/09/2017  . Hydradenitis 05/25/2017  . Acute upper respiratory infection 05/25/2017  . Tobacco use disorder 05/25/2017  . Current severe episode of major depressive disorder without psychotic features (HCC) 05/25/2017  . Generalized anxiety disorder 05/25/2017  . Contraceptive management 05/25/2017  . Cellulitis 10/01/2010  . Sore throat 03/04/2010  . PAP SMEAR, ABNORMAL, ASCUS 07/23/2009  . OBESITY, CLASS III 03/21/2009  . ANEMIA, IRON DEFICIENCY 12/17/2007   - Medications: reviewed and updated   Objective:   Physical Exam BP 118/70   Pulse 68   Temp 98.6 F (37 C) (Oral)   Ht 5\' 2"  (1.575 m)   Wt  253 lb (114.8 kg)   LMP 09/07/2017 (Exact Date)   BMI 46.27 kg/m  Gen: NAD, alert, cooperative with exam, frequently tearful, morbidly obese CV: RRR, good S1/S2, no murmur Resp: CTABL, no wheezes, non-labored Psych: Fair insight and judgment, anxious and sad affect  Depression screen [facility] 2/9 09/09/2017 06/11/2017 05/25/2017  Decreased Interest 3 3 2   Down, Depressed, Hopeless 2 2 2   PHQ - 2 Score 5 5 4   Altered sleeping 1 2 3   Tired, decreased energy 3 2 3   Change in appetite 2 2 2   Feeling bad or failure about yourself  2 1 1   Trouble concentrating 3 2 3   Moving slowly or fidgety/restless 2 2 3   Suicidal thoughts 0 0 0  PHQ-9 Score 18 16 19   Difficult doing work/chores Very difficult Extremely dIfficult Very difficult   GAD 7 : Generalized Anxiety Score 09/09/2017 06/11/2017 05/25/2017  Nervous, Anxious, on Edge 3 3 2   Control/stop worrying 3 3 3   Worry too much - different things 3 3 3   Trouble relaxing 3 3 3   Restless 3 2 3   Easily annoyed or irritable 3 3 2   Afraid - awful might happen 3 1 3   Total GAD 7 Score 21 18 19   Anxiety Difficulty Extremely difficult Extremely difficult Extremely difficult        Assessment & Plan:   Current severe episode of major depressive disorder without psychotic features (HCC) Since  Prozac was not helpful to this patient and she took it for 2 months, we will switch to Zoloft 50 mg today.  Advised patient that she may not feel the full effects of this medication for 6 to 8 weeks, and we are starting at the lowest dose, so we can increase the dose after a month or 2 if needed.  Advised patient to come in to be seen if she finds that her medication is not working rather than stop taking it.  Generalized anxiety disorder It is possible that patient's visualization of shadows could be a form of psychosis, but it does seem that she understands that these are not real.  They could also likely be an exaggerated response to visual stimuli due to her  anxiety.  We will add BuSpar 15 mg twice daily to patient's Zoloft.  Reviewed with patient breathing exercises and statements that she can say to herself when she feels particularly anxious in order to calm herself down.  Also strongly encouraged patient to set up an appointment with integrated care, because she would significantly benefit from having someone to talk to on a regular basis who can also give her more coping mechanisms for her anxiety.  Would like for her to see me at regular intervals as well to continue discussing her mood and coping strategies.  Contraceptive management Scheduled patient to be seen tomorrow for removal of Nexplanon.  Discussed other forms of birth control, including inserting a Nexplanon on the same day that she gets it removed, and patient seemed interested in this option.  Healthcare maintenance Patient has been due for a Pap smear, but so much time has been devoted to discussing her significant anxiety and depression that this could not be done.  Strongly encouraged patient to make an appointment to get a Pap smear in the next month.    Lezlie Octave, M.D. 09/09/2017, 10:18 AM PGY-2, [facility]

## 2017-09-08 ENCOUNTER — Ambulatory Visit (INDEPENDENT_AMBULATORY_CARE_PROVIDER_SITE_OTHER): Payer: Managed Care, Other (non HMO) | Admitting: Family Medicine

## 2017-09-08 VITALS — BP 114/82 | HR 74 | Temp 98.5°F | Wt 255.0 lb

## 2017-09-08 DIAGNOSIS — Z3009 Encounter for other general counseling and advice on contraception: Secondary | ICD-10-CM | POA: Diagnosis not present

## 2017-09-08 NOTE — Patient Instructions (Addendum)
Start taking a prenatal vitamin  Do not take any NSAIDs (advil, motrin, ibuprofen, goodys, aspirin)     Nexplanon Instructions After Insertion   Keep bandage clean and dry for 24 hours   May use ice/Tylenol for soreness or pain   If you develop fever, drainage or increased warmth from incision site-contact office immediately    Safe Medications in Pregnancy    Acne:  Benzoyl Peroxide  Salicylic Acid   Backache/Headache:  Tylenol: 2 regular strength every 4 hours OR        2 Extra strength every 6 hours   Colds/Coughs/Allergies:  Benadryl (alcohol free) 25 mg every 6 hours as needed  Breath right strips  Claritin  Cepacol throat lozenges  Chloraseptic throat spray  Cold-Eeze- up to three times per day  Cough drops, alcohol free  Flonase (by prescription only)  Guaifenesin  Mucinex  Robitussin DM (plain only, alcohol free)  Saline nasal spray/drops  Sudafed (pseudoephedrine) & Actifed * use only after [redacted] weeks gestation and if you do not have high blood pressure  Tylenol  Vicks Vaporub  Zinc lozenges  Zyrtec   Constipation:  Colace  Ducolax suppositories  Fleet enema  Glycerin suppositories  Metamucil  Milk of magnesia  Miralax  Senokot  Smooth move tea   Diarrhea:  Kaopectate  Imodium A-D   *NO pepto Bismol   Hemorrhoids:  Anusol  Anusol HC  Preparation H  Tucks   Indigestion:  Tums  Maalox  Mylanta  Zantac  Pepcid   Insomnia:  Benadryl (alcohol free) 25mg  every 6 hours as needed  Tylenol PM  Unisom, no Gelcaps   Leg Cramps:  Tums  MagGel   Nausea/Vomiting:  Bonine  Dramamine  Emetrol  Ginger extract  Sea bands  Meclizine  Nausea medication to take during pregnancy:  Unisom (doxylamine succinate 25 mg tablets) Take one tablet daily at bedtime. If symptoms are not adequately controlled, the dose can be increased to a maximum recommended dose of two tablets daily (1/2 tablet in the morning, 1/2 tablet mid-afternoon  and one at bedtime).  Vitamin B6 100mg  tablets. Take one tablet twice a day (up to 200 mg per day).   Skin Rashes:  Aveeno products  Benadryl cream or 25mg  every 6 hours as needed  Calamine Lotion  1% cortisone cream   Yeast infection:  Gyne-lotrimin 7  Monistat 7    **If taking multiple medications, please check labels to avoid duplicating the same active ingredients  **take medication as directed on the label  ** Do not exceed 4000 mg of tylenol in 24 hours  **Do not take medications that contain aspirin or ibuprofen

## 2017-09-08 NOTE — Assessment & Plan Note (Signed)
Nexplanon removed today, see procedure note below. Patient was counseled on starting prenatal vitamin and medication list was reviewed.

## 2017-09-08 NOTE — Progress Notes (Signed)
    Subjective:  Denise Gillespie is a 27 y.o. female who [facility] nex[facility]  HPI:  Wants to get pregnant.  Has had nexplanon in since 2011. Didn't used to have periods and then started to resume about 4 months ago.  Started period yesterday. No cramping.   ROS: Per HPI   Objective:  Physical Exam: BP 114/82   Pulse 74   Temp 98.5 F (36.9 C) (Oral)   Wt 255 lb (115.7 kg)   LMP 09/07/2017 (Exact Date)   BMI 46.64 kg/m   Gen: NAD, resting comfortably Pulm: NWOB on room air Skin: warm, dry Neuro: grossly normal, moves all extremities Psych: Normal affect and thought content   Assessment/Plan:  Contraceptive management Nexplanon removed today, see procedure note below. Patient was counseled on starting prenatal vitamin and medication list was reviewed.    PROCEDURE NOTE: NEXPLANON  REMOVAL Patient given informed consent and signed copy in the chart. L arm area prepped and draped in the usual sterile fashion. 2 cc of lidocaine without epinephrine 1% used for local anesthesia. A small stab incision was made close to the nexplanon with scalpel. Hemostats were used to withdraw the nexplanon. A small bandage was applied over a steri strip  No complications.Patient given follow up instructions should she experience redness, swelling at sight or fever in the next 24 hours. Patient was reminded this totally removes her nexplanon contraceptive device. (she can now potentially conceive)   Leland HerElsia J Yoo, DO PGY-3, [facility] 09/08/2017 8:49 AM

## 2017-09-08 NOTE — Progress Notes (Signed)
poct u

## 2017-09-09 NOTE — Assessment & Plan Note (Signed)
Scheduled patient to be seen tomorrow for removal of Nexplanon.  Discussed other forms of birth control, including inserting a Nexplanon on the same day that she gets it removed, and patient seemed interested in this option.

## 2017-09-09 NOTE — Assessment & Plan Note (Signed)
Patient has been due for a Pap smear, but so much time has been devoted to discussing her significant anxiety and depression that this could not be done.  Strongly encouraged patient to make an appointment to get a Pap smear in the next month.

## 2017-09-09 NOTE — Assessment & Plan Note (Signed)
It is possible that patient's visualization of shadows could be a form of psychosis, but it does seem that she understands that these are not real.  They could also likely be an exaggerated response to visual stimuli due to her anxiety.  We will add BuSpar 15 mg twice daily to patient's Zoloft.  Reviewed with patient breathing exercises and statements that she can say to herself when she feels particularly anxious in order to calm herself down.  Also strongly encouraged patient to set up an appointment with integrated care, because she would significantly benefit from having someone to talk to on a regular basis who can also give her more coping mechanisms for her anxiety.  Would like for her to see me at regular intervals as well to continue discussing her mood and coping strategies.

## 2017-09-09 NOTE — Assessment & Plan Note (Signed)
Since Prozac was not helpful to this patient and she took it for 2 months, we will switch to Zoloft 50 mg today.  Advised patient that she may not feel the full effects of this medication for 6 to 8 weeks, and we are starting at the lowest dose, so we can increase the dose after a month or 2 if needed.  Advised patient to come in to be seen if she finds that her medication is not working rather than stop taking it.

## 2017-09-11 ENCOUNTER — Telehealth: Payer: Self-pay | Admitting: Family Medicine

## 2017-09-11 NOTE — Telephone Encounter (Signed)
Clinical info completed on FMLA form.  Place form in winfrey's box for completion.  Fleeger, Maryjo RochesterJessica Dawn, CMA

## 2017-09-11 NOTE — Telephone Encounter (Signed)
FMLA form dropped off for work at front desk for completion.  Verified that patient section of form has been completed.  Last DOS/WCC with PCP was 09/08/2017.  Placed form in team folder to be completed by clinical staff.  Denise Gillespie

## 2017-09-15 ENCOUNTER — Other Ambulatory Visit: Payer: Self-pay | Admitting: Family Medicine

## 2017-09-15 ENCOUNTER — Telehealth: Payer: Self-pay | Admitting: Family Medicine

## 2017-09-15 MED ORDER — DOXYCYCLINE HYCLATE 100 MG PO TABS: 100.0000 mg | ORAL_TABLET | Freq: Two times a day (BID) | ORAL | 0 refills | Status: DC

## 2017-09-15 NOTE — Telephone Encounter (Signed)
Patient says that Dr. Frances Furbish told her she could call to speak with her over the phone.  SHe is requesting an antibiotic for HS.

## 2017-09-15 NOTE — Telephone Encounter (Signed)
FMLA form placed in fax pile today.

## 2017-09-15 NOTE — Progress Notes (Signed)
Patient left note on FMLA request form requesting antibiotics for a hydradenitis flare.  Since I had previously told her to contact us for antibiotics if she got a flare in the future, I sent in doxycycline 100 mg BID for 7 days.  Called patient to inform her of this prescription.

## 2017-09-16 NOTE — Telephone Encounter (Signed)
I called the patient and prescribed the antibiotics for her HS yesterday afternoon.  Thanks!

## 2017-09-16 NOTE — Telephone Encounter (Signed)
LVM for pt to call the office, if pt calls, please give her the info below.Denise Gillespie, CMA d

## 2017-09-16 NOTE — Telephone Encounter (Signed)
Completed forms faxed to [employer]. Copies for patient at front desk for pick up. Copies placed in scan box Shawna Orleans, RN

## 2017-10-01 ENCOUNTER — Emergency Department (HOSPITAL_COMMUNITY)
Admission: EM | Admit: 2017-10-01 | Discharge: 2017-10-02 | Disposition: A | Discharge status: Home / Self Care | Payer: Managed Care, Other (non HMO) | Attending: Emergency Medicine | Admitting: Emergency Medicine

## 2017-10-01 ENCOUNTER — Encounter (HOSPITAL_COMMUNITY): Payer: Self-pay | Admitting: Obstetrics and Gynecology

## 2017-10-01 ENCOUNTER — Other Ambulatory Visit: Payer: Self-pay

## 2017-10-01 ENCOUNTER — Telehealth: Payer: Self-pay | Admitting: Family Medicine

## 2017-10-01 DIAGNOSIS — L0211 Cutaneous abscess of neck: Secondary | ICD-10-CM | POA: Diagnosis not present

## 2017-10-01 DIAGNOSIS — Z79899 Other long term (current) drug therapy: Secondary | ICD-10-CM | POA: Diagnosis not present

## 2017-10-01 DIAGNOSIS — L0291 Cutaneous abscess, unspecified: Secondary | ICD-10-CM

## 2017-10-01 DIAGNOSIS — F17210 Nicotine dependence, cigarettes, uncomplicated: Secondary | ICD-10-CM | POA: Insufficient documentation

## 2017-10-01 DIAGNOSIS — L732 Hidradenitis suppurativa: Secondary | ICD-10-CM | POA: Diagnosis not present

## 2017-10-01 MED ORDER — DOXYCYCLINE HYCLATE 100 MG PO TABS
100.0000 mg | ORAL_TABLET | Freq: Once | ORAL | Status: AC
  Administered 2017-10-01: 100 mg via ORAL
  Filled 2017-10-01: qty 1

## 2017-10-01 MED ORDER — HYDROCODONE-ACETAMINOPHEN 5-325 MG PO TABS
1.0000 | ORAL_TABLET | Freq: Once | ORAL | Status: AC
  Administered 2017-10-01: 1 via ORAL
  Filled 2017-10-01: qty 1

## 2017-10-01 NOTE — ED Provider Notes (Signed)
[facility]-EMERGENCY DEPT Provider Note   CSN: [id number] Arrival date & time: 10/01/17  2153     History   Chief Complaint Chief Complaint  Patient presents with  . Abscess    HPI Denise Gillespie is a 27 y.o. female.  Denise Gillespie is a [age] y.o. Female with history of hidradenitis suppurativa with multiple abscesses, iron deficiency anemia and obesity, who presents to the emergency department for evaluation of abscess on the right side of the back of her neck.  She reports this is been there for 9 days.  She reports it feels hard to palpation, and has not softened up like they typically do before they start draining.  Ports that has become increasingly painful and seems to be tracking to the right side a little bit.  She has not had any fevers or chills, she is been taking Motrin and Tylenol for the pain without much improvement, has not tried anything else to treat her symptoms.  She reports he is often improved with antibiotics, her primary care doctor had prescribed her antibiotics a few weeks ago for another abscess that is since resolved but she was unable to pick these up because for some reason even with her insurance it would have cost $180.  She denies any associated nausea or vomiting, no abscesses elsewhere.  Patient specifically expresses that she would not like to have this drained here in the ED today, she is aware that this may get worse.     Past Medical History:  Diagnosis Date  . Abscess    multiple  . Anemia, iron deficiency   . Gestational hypertension    [facility]  . Obesity     Patient Active Problem List   Diagnosis Date Noted  . Healthcare maintenance 09/09/2017  . Hydradenitis 05/25/2017  . Acute upper respiratory infection 05/25/2017  . Tobacco use disorder 05/25/2017  . Current severe episode of major depressive disorder without psychotic features (HCC) 05/25/2017  . Generalized anxiety disorder  05/25/2017  . Contraceptive management 05/25/2017  . Cellulitis 10/01/2010  . Sore throat 03/04/2010  . PAP SMEAR, ABNORMAL, ASCUS 07/23/2009  . OBESITY, CLASS III 03/21/2009  . ANEMIA, IRON DEFICIENCY 12/17/2007    Past Surgical History:  Procedure Laterality Date  . CESAREAN SECTION    . INCISE AND DRAIN ABCESS       OB History    Gravida      Para      Term      Preterm      AB      Living  2     SAB      TAB      Ectopic      Multiple      Live Births               Home Medications    Prior to Admission medications   Medication Sig Start Date End Date Taking? Authorizing Provider  busPIRone (BUSPAR) 7.5 MG tablet Take 1 tablet (7.5 mg total) by mouth 2 (two) times daily. 09/07/17   Lennox SoldersWinfrey, Amanda C, MD  doxycycline (VIBRA-TABS) 100 MG tablet Take 1 tablet (100 mg total) by mouth 2 (two) times daily. 09/15/17   Lennox SoldersWinfrey, Amanda C, MD  sertraline (ZOLOFT) 50 MG tablet Take 1 tablet (50 mg total) by mouth daily. 09/07/17   Lennox SoldersWinfrey, Amanda C, MD  traZODone (DESYREL) 50 MG tablet Take 1 tablet (50 mg total) by mouth  at bedtime. 06/10/17   Lennox Solders, MD    Family History Family History  Problem Relation Age of Onset  . Obesity Mother   . Heart disease Mother   . Hypertension Mother   . Migraines Mother   . Eczema Brother     Social History Social History   Tobacco Use  . Smoking status: Current Some Day Smoker    Packs/day: 0.25    Types: Cigarettes  . Smokeless tobacco: Never Used  . Tobacco comment: "cutting back"  Substance Use Topics  . Alcohol use: Yes    Comment: Social  . Drug use: No     Allergies   Patient has no known allergies.   Review of Systems Review of Systems  Constitutional: Negative for chills and fever.  Gastrointestinal: Negative for nausea and vomiting.  Musculoskeletal: Positive for neck pain. Negative for neck stiffness.  Skin:       Abscess  Neurological: Negative for weakness and numbness.  All  other systems reviewed and are negative.    Physical Exam Updated Vital Signs BP (!) 135/109 (BP Location: Left Arm)   Pulse 92   Temp 98.4 F (36.9 C) (Oral)   Resp 15   Ht 5\' 2"  (1.575 m)   Wt 115.7 kg   LMP 08/27/2017 (Exact Date)   SpO2 98%   BMI 46.64 kg/m   Physical Exam  Constitutional: She is oriented to person, place, and time. She appears well-developed and well-nourished. No distress.  HENT:  Head: Normocephalic and atraumatic.  Eyes: Right eye exhibits no discharge. Left eye exhibits no discharge.  Neck:  Abscess present over the back of the neck on the right side, there is a 2 x 4 cm area of fluctuance and induration, slightly warm to the touch, it does start to wrap around to the right side but is very superficial and is not near the great vessels, no expressible drainage No stridor  Cardiovascular: Normal rate, regular rhythm, normal heart sounds and intact distal pulses.  Pulmonary/Chest: Effort normal and breath sounds normal. No stridor. No respiratory distress. She has no wheezes. She has no rales.  Respirations equal and unlabored, patient able to speak in full sentences, lungs clear to auscultation bilaterally  Musculoskeletal: She exhibits no deformity.  Neurological: She is alert and oriented to person, place, and time. Coordination normal.  Skin: Skin is warm and dry. She is not diaphoretic.  Psychiatric: She has a normal mood and affect. Her behavior is normal.  Nursing note and vitals reviewed.    ED Treatments / Results  Labs (all labs ordered are listed, but only abnormal results are displayed) Labs Reviewed - No data to display  EKG None  Radiology No results found.  Procedures Procedures (including critical care time)  Medications Ordered in ED Medications  HYDROcodone-acetaminophen (NORCO/VICODIN) 5-325 MG per tablet 1 tablet (1 tablet Oral Given 10/01/17 2337)  doxycycline (VIBRA-TABS) tablet 100 mg (100 mg Oral Given 10/01/17 2337)       Initial Impression / Assessment and Plan / ED Course  I have reviewed the triage vital signs and the nursing notes.  Pertinent labs & imaging results that were available during my care of the patient were reviewed by me and considered in my medical decision making (see chart for details).  Patient with history of hidradenitis suprativa presents to the ED for evaluation of abscess over the back of her neck, present for the past 9 days with increasing pain.  No fevers, nausea  or vomiting.  Reports history of abscesses in similar location.  Patient specifically reports she does not want to have this drained today, she reports she is frequently able to get them to resolve or drain on their own with antibiotics.  Her PCP had prescribed her doxycycline that she was unable to afford this at the time, the pharmacy reported because $180.  She has been taking ibuprofen and Tylenol without relief of pain.  Will prescribe doxycycline, provided good Rx coupon to make affordable, Norco for breakthrough pain, encouraged to continue to use Motrin and warm compresses.  She is to follow-up with her PCP.  Discussed with patient that this may worsen and may still require incision and drainage and she is aware of this is in agreement with plan will return if not improving.  Stable for discharge home at this time.  Final Clinical Impressions(s) / ED Diagnoses   Final diagnoses:  Abscess  Hidradenitis suppurativa    ED Discharge Orders         Ordered    doxycycline (VIBRAMYCIN) 100 MG capsule  2 times daily     10/02/17 0015    HYDROcodone-acetaminophen (NORCO) 5-325 MG tablet  Every 6 hours PRN     10/02/17 0015           Dartha Lodge, PA-C 10/02/17 0981    Geoffery Lyons, MD 10/02/17 [phone]

## 2017-10-01 NOTE — Telephone Encounter (Signed)
Patient says she was told by the doctor/pcp here to call her if she has trouble getting her anxiety medication and antibiotics.  These are very expensive and she cannot afford them.  Is there anything we can do to help her with this?  Please call her at [phone].

## 2017-10-01 NOTE — ED Triage Notes (Signed)
Pt reports she has an abscess on the back of the neck. The area has reportedly been there for 9 days. RN able to feel a large area of inflammation on the back of the neck.

## 2017-10-02 ENCOUNTER — Other Ambulatory Visit: Payer: Self-pay

## 2017-10-02 ENCOUNTER — Other Ambulatory Visit: Payer: Self-pay | Admitting: Family Medicine

## 2017-10-02 MED ORDER — HYDROCODONE-ACETAMINOPHEN 5-325 MG PO TABS: 1.0000 | ORAL_TABLET | Freq: Four times a day (QID) | ORAL | 0 refills | Status: DC | PRN

## 2017-10-02 MED ORDER — BUSPIRONE HCL 7.5 MG PO TABS: 7.5000 mg | ORAL_TABLET | Freq: Two times a day (BID) | ORAL | 3 refills | Status: DC

## 2017-10-02 MED ORDER — DOXYCYCLINE HYCLATE 100 MG PO CAPS: 100.0000 mg | ORAL_CAPSULE | Freq: Two times a day (BID) | ORAL | 0 refills | Status: DC

## 2017-10-02 NOTE — Discharge Instructions (Signed)
Please take antibiotics twice daily as directed.  Use warm compresses several times a day to help aid drainage, if this continues to increase in size, have worsening pain, develop fevers, vomiting or any other new or concerning symptoms he will need to return to the ED and this will need to be drained.  You may also follow-up with your primary care doctor.

## 2017-10-02 NOTE — Telephone Encounter (Signed)
I spoke with patient and we agreed to try sending a smaller amount of buspirone to see if it's cheaper.  She is not sure what the doxycycline will cost after the ED sent a new Rx, but she has a coupon, so hopefully it will be affordable.

## 2017-11-03 ENCOUNTER — Telehealth: Payer: Self-pay | Admitting: Family Medicine

## 2017-11-03 NOTE — Telephone Encounter (Signed)
Pt calling to inform Dr. Frances Furbish that she is having a lot of swelling and pain. She would like to know if she can send something in for pain and an antibiotic. The best contact number is [phone].

## 2017-11-04 ENCOUNTER — Other Ambulatory Visit: Payer: Self-pay | Admitting: Family Medicine

## 2017-11-04 MED ORDER — DOXYCYCLINE HYCLATE 100 MG PO CAPS: 100.0000 mg | ORAL_CAPSULE | Freq: Two times a day (BID) | ORAL | 0 refills | Status: DC

## 2017-11-04 NOTE — Telephone Encounter (Signed)
Pt called back and said she would like for Dr. Frances Furbish to call her back.

## 2017-11-04 NOTE — Telephone Encounter (Signed)
Left message on patient's voicemail that I will send in the doxycycline prescription that she took last month for her hidradenitis.  I also recommended that she print off a good Rx coupon if possible, since this may help make the medication more affordable.  I told her that for pain, she should use Motrin and Tylenol, and that opioid pain medicine was not recommended for this condition.  I advised her to call the clinic if she has trouble obtaining the medication I have prescribed and if the medication does not work for her.

## 2017-11-05 ENCOUNTER — Telehealth: Payer: Self-pay | Admitting: Family Medicine

## 2017-11-05 NOTE — Telephone Encounter (Signed)
Called patient again and left voicemail stating that she should make an appointment to see any provider to be assessed in person if she does not think that the plan that I left on her voicemail yesterday will work for her.  I also encouraged her to call and state a specific reason for calling so that I can provide her with a specific answer if I need to leave a message. 

## 2017-11-05 NOTE — Telephone Encounter (Signed)
Called patient again and left voicemail stating that she should make an appointment to see any provider to be assessed in person if she does not think that the plan that I left on her voicemail yesterday will work for her.  I also encouraged her to call and state a specific reason for calling so that I can provide her with a specific answer if I need to leave a message.

## 2017-11-24 ENCOUNTER — Ambulatory Visit (INDEPENDENT_AMBULATORY_CARE_PROVIDER_SITE_OTHER): Payer: 59 | Admitting: Family Medicine

## 2017-11-24 ENCOUNTER — Other Ambulatory Visit: Payer: Self-pay

## 2017-11-24 ENCOUNTER — Encounter: Payer: Self-pay | Admitting: Family Medicine

## 2017-11-24 DIAGNOSIS — L732 Hidradenitis suppurativa: Secondary | ICD-10-CM

## 2017-11-24 DIAGNOSIS — L0390 Cellulitis, unspecified: Secondary | ICD-10-CM | POA: Diagnosis not present

## 2017-11-24 MED ORDER — CEPHALEXIN 500 MG PO CAPS: 500.0000 mg | ORAL_CAPSULE | Freq: Four times a day (QID) | ORAL | 0 refills | Status: DC

## 2017-11-24 MED ORDER — MUPIROCIN 2 % EX OINT: 1.0000 "application " | TOPICAL_OINTMENT | Freq: Two times a day (BID) | CUTANEOUS | 0 refills | Status: DC

## 2017-11-24 NOTE — Patient Instructions (Signed)
I hope the antibiotics and nasal bactroban help. You may need surgery to remove the cyst in your neck.   I think the you likely have at least two problems The HS is just armpits and groin area. The other areas are skin infections, maybe with cysts.

## 2017-11-24 NOTE — Assessment & Plan Note (Addendum)
Keflex, nasal bactroban and warm compresses.  Likely multifactoreal  Nothing to I&D.  Neck lesion and history is suspicious for an infected sebaceous cyst which will likely need future surgical removal.

## 2017-11-25 ENCOUNTER — Encounter: Payer: Self-pay | Admitting: Family Medicine

## 2017-11-25 ENCOUNTER — Telehealth: Payer: Self-pay | Admitting: Family Medicine

## 2017-11-25 NOTE — Telephone Encounter (Signed)
done

## 2017-11-25 NOTE — Progress Notes (Signed)
   Subjective:    Patient ID: Denise Gillespie, female    DOB: 10/13/1990, 27 y.o.   MRN: [id number]  HPI Patient with recurrent boils and has them again.  Has one on neck which has been recurrent in the same location.  Also has several active ones between breasts.  She has been diagnosed with hidradenitis supprutivum in the past due to recurrent boils in axillae and pubis.  Never treated with nasal bactroban.  Prefers to avoid I&D.  Historically, they drain on own.    Review of Systems     Objective:   Physical ExamLeft post neck swelling and some erythema.   Multiple small draining lesions between breasts. Axillae just show old scarring.        Assessment & Plan:

## 2017-11-25 NOTE — Telephone Encounter (Signed)
Pt saw Dr. Leveda AnnaHensel yesterday concerning her hs. She would like to know if Dr. Leveda AnnaHensel could write a letter stating that she is able to be back at work. She said she would like to have this as soon as possible. She would like for someone to call her when it is ready to be picked up at the front desk. The best call back number is [phone].

## 2017-11-25 NOTE — Assessment & Plan Note (Signed)
Likely has this but also other issues.  See cellulitis.

## 2017-12-31 ENCOUNTER — Other Ambulatory Visit: Payer: Self-pay

## 2017-12-31 ENCOUNTER — Encounter: Payer: Self-pay | Admitting: Family Medicine

## 2017-12-31 ENCOUNTER — Ambulatory Visit (INDEPENDENT_AMBULATORY_CARE_PROVIDER_SITE_OTHER): Payer: 59 | Admitting: Family Medicine

## 2017-12-31 VITALS — BP 130/80 | HR 78 | Temp 98.5°F | Wt 258.0 lb

## 2017-12-31 DIAGNOSIS — L732 Hidradenitis suppurativa: Secondary | ICD-10-CM | POA: Diagnosis not present

## 2017-12-31 DIAGNOSIS — F411 Generalized anxiety disorder: Secondary | ICD-10-CM

## 2017-12-31 MED ORDER — SERTRALINE HCL 100 MG PO TABS: 50.0000 mg | ORAL_TABLET | Freq: Every day | ORAL | 3 refills | Status: DC

## 2017-12-31 MED ORDER — DOXYCYCLINE HYCLATE 100 MG PO CAPS: 100.0000 mg | ORAL_CAPSULE | Freq: Every day | ORAL | 2 refills | Status: DC

## 2017-12-31 NOTE — Assessment & Plan Note (Signed)
Will have patient continue the mupirocin and Hibiclens and have her start doxycycline 100 mg once per day rather than Keflex only with flareups.  We will also send a referral to dermatology, since patient's condition is disrupting her life and causing her to miss work frequently.

## 2017-12-31 NOTE — Progress Notes (Signed)
   Subjective:    Denise BlaseShaquitia L Tavano - 27 y.o. female MRN [id number]  Date of birth: 10/06/1990  CC:  Denise Gillespie is here to discuss her hidradenitis and anxiety.  HPI:  Hidradenitis -Has been using mupirocin ointment in her nose an Hibiclens as well as Keflex for any flares that she is having -Is interested in a dermatology referral, since the pain of her flares prevents her from going to work a few times per month -Currently has a flare on the left side of her torso, but it is not as bad as her other flares have been  Anxiety -Patient says that she had since she has moved, her anxiety is under slightly better control since that she and her kids enjoy where they are now living -Is interested in increasing her Zoloft dose and has been taking BuSpar as well as Zoloft regularly   Health Maintenance:  Health Maintenance Due  Topic Date Due  . PAP-Cervical Cytology Screening  04/20/2010  . PAP SMEAR-Modifier  02/02/2012    -  reports that she has been smoking cigarettes. She has been smoking about 0.25 packs per day. She has never used smokeless tobacco. - Review of Systems: Per HPI. - Past Medical History: Patient Active Problem List   Diagnosis Date Noted  . Hydradenitis 05/25/2017  . Tobacco use disorder 05/25/2017  . Current severe episode of major depressive disorder without psychotic features (HCC) 05/25/2017  . Generalized anxiety disorder 05/25/2017  . Contraceptive management 05/25/2017  . Cellulitis 10/01/2010  . Sore throat 03/04/2010  . PAP SMEAR, ABNORMAL, ASCUS 07/23/2009  . OBESITY, CLASS III 03/21/2009  . ANEMIA, IRON DEFICIENCY 12/17/2007   - Medications: reviewed and updated   Objective:   Physical Exam BP 130/80   Pulse 78   Temp 98.5 F (36.9 C) (Oral)   Wt 258 lb (117 kg)   LMP 12/24/2017   SpO2 98%   BMI 47.19 kg/m  Gen: NAD, alert, cooperative with exam, well-appearing, pleasant Skin: Numerous lesions in various stages of healing  along patient's bra line, breasts, and neck.  One open lesion on the left side of her torso without pustular drainage  GAD 7 : Generalized Anxiety Score 12/31/2017 09/09/2017 06/11/2017 05/25/2017  Nervous, Anxious, on Edge 3 3 3 2   Control/stop worrying 3 3 3 3   Worry too much - different things 2 3 3 3   Trouble relaxing 3 3 3 3   Restless 1 3 2 3   Easily annoyed or irritable 3 3 3 2   Afraid - awful might happen 1 3 1 3   Total GAD 7 Score 16 21 18 19   Anxiety Difficulty Very difficult Extremely difficult Extremely difficult Extremely difficult       Assessment & Plan:   Hydradenitis Will have patient continue the mupirocin and Hibiclens and have her start doxycycline 100 mg once per day rather than Keflex only with flareups.  We will also send a referral to dermatology, since patient's condition is disrupting her life and causing her to miss work frequently.  Generalized anxiety disorder Patient's anxiety seems to be slightly better than in previous visits but is still causing her dysfunction.  Will increase patient's dose of Zoloft from 50 mg to 100 mg daily with plan to adjust as necessary.  Will also continue BuSpar.    Lezlie OctaveAmanda Winfrey, M.D. 12/31/2017, 3:03 PM PGY-2, [facility]

## 2017-12-31 NOTE — Patient Instructions (Addendum)
It was nice seeing you today Ms. Denise Gillespie!  For your anxiety, we are going to try to increase her Zoloft to 100 mg daily.  Please let me know if this new dose does not agree with you, and we can always go back down to 50 mg daily.  For your skin, please continue the current ointment for your nose and the Hibiclens wash and take doxycycline 100 mg once per day.  We can always increase this dose to twice per day if it does not seem to be making a difference.  I have sent a referral for dermatology today.  If you have any questions or concerns, please feel free to call the clinic.   Be well,  Dr. Frances FurbishWinfrey

## 2017-12-31 NOTE — Assessment & Plan Note (Signed)
Patient's anxiety seems to be slightly better than in previous visits but is still causing her dysfunction.  Will increase patient's dose of Zoloft from 50 mg to 100 mg daily with plan to adjust as necessary.  Will also continue BuSpar.

## 2018-01-07 ENCOUNTER — Other Ambulatory Visit: Payer: Self-pay

## 2018-01-07 ENCOUNTER — Ambulatory Visit (INDEPENDENT_AMBULATORY_CARE_PROVIDER_SITE_OTHER): Payer: 59 | Admitting: Family Medicine

## 2018-01-07 ENCOUNTER — Other Ambulatory Visit: Payer: Self-pay | Admitting: Family Medicine

## 2018-01-07 ENCOUNTER — Encounter: Payer: Self-pay | Admitting: Family Medicine

## 2018-01-07 VITALS — BP 124/74 | HR 87 | Temp 99.1°F | Ht 62.0 in | Wt 260.0 lb

## 2018-01-07 DIAGNOSIS — J029 Acute pharyngitis, unspecified: Secondary | ICD-10-CM | POA: Diagnosis not present

## 2018-01-07 DIAGNOSIS — Z124 Encounter for screening for malignant neoplasm of cervix: Secondary | ICD-10-CM

## 2018-01-07 LAB — POCT RAPID STREP A (OFFICE): Rapid Strep A Screen: POSITIVE — AB

## 2018-01-07 MED ORDER — DOXYCYCLINE HYCLATE 100 MG PO CAPS: 100.0000 mg | ORAL_CAPSULE | Freq: Every day | ORAL | 2 refills | Status: DC

## 2018-01-07 MED ORDER — PENICILLIN V POTASSIUM 500 MG PO TABS: 500.0000 mg | ORAL_TABLET | Freq: Three times a day (TID) | ORAL | 0 refills | Status: AC

## 2018-01-07 NOTE — Progress Notes (Deleted)
   Subjective:    Denise Gillespie - 27 y.o. female MRN [id number]  Date of birth: 06/24/1990  CC:  Denise Gillespie is here for ***.  HPI:   Health Maintenance:  - *** Health Maintenance Due  Topic Date Due  . PAP-Cervical Cytology Screening  04/20/2010  . PAP SMEAR-Modifier  02/02/2012    -  reports that she has been smoking cigarettes. She has been smoking about 0.25 packs per day. She has never used smokeless tobacco. - Review of Systems: Per HPI. - Past Medical History: Patient Active Problem List   Diagnosis Date Noted  . Hydradenitis 05/25/2017  . Tobacco use disorder 05/25/2017  . Current severe episode of major depressive disorder without psychotic features (HCC) 05/25/2017  . Generalized anxiety disorder 05/25/2017  . Contraceptive management 05/25/2017  . Cellulitis 10/01/2010  . Sore throat 03/04/2010  . PAP SMEAR, ABNORMAL, ASCUS 07/23/2009  . OBESITY, CLASS III 03/21/2009  . ANEMIA, IRON DEFICIENCY 12/17/2007   - Medications: reviewed and updated   Objective:   Physical Exam LMP 12/24/2017  Gen: NAD, alert, cooperative with exam, appears tired HEENT: NCAT, PERRL, clear conjunctiva, oropharynx clear and without exudate, supple neck, 1 anterior cervical lymph node palpated on right side, which was tender CV: RRR, good S1/S2, no murmur, no edema, capillary refill brisk  Resp: CTABL, no wheezes, non-labored Abd: SNTND, BS present, no guarding or organomegaly Psych: good insight, alert and oriented        Assessment & Plan:   Sore throat Differential includes strep throat, viral upper respiratory infection, and influenza.  Most likely diagnosis is viral upper respiratory infection due to patient's cough and age.  Her temperature after taking Tylenol is 99.1 and she does not have measured temperatures from home, so unsure if she has had temperatures high enough to make flu more likely.  She does have elementary age children who could have passed  strep to her, but she does not have pharyngeal exudate and does have a cough, making this less likely.  Centor score is 1 4 painful cervical adenopathy.  Will test for strep and if negative, will recommend conservative measures to improve patient comfort for a likely viral URI.    Lezlie OctaveAmanda Winfrey, M.D. 01/07/2018, 3:17 PM PGY-2, [facility]

## 2018-01-07 NOTE — Progress Notes (Signed)
   Subjective:    Denise BlaseShaquitia L Chappuis - 27 y.o. female MRN [id number]  Date of birth: 05/15/1990  CC:  Denise Gillespie is here for sore throat, body aches.  HPI:  Sore throat, body aches Started three days ago with pain in her throat Has had a cough since two days ago Developed body aches two days ago Has had nausea but no vomiting Has tried hot tea with honey and lemon, taken cough drops, and throat spray Swallowing, eating, yelling make throat pain worse Has not taken her temperature at home No known sick contacts No rhinorrhea but has had post-nasal drip Did not receive the flu shot this year  Health Maintenance:  Health Maintenance Due  Topic Date Due  . PAP-Cervical Cytology Screening  04/20/2010  . PAP SMEAR-Modifier  02/02/2012    -  reports that she has been smoking cigarettes. She has been smoking about 0.25 packs per day. She has never used smokeless tobacco. - Review of Systems: Per HPI. - Past Medical History: Patient Active Problem List   Diagnosis Date Noted  . Hydradenitis 05/25/2017  . Tobacco use disorder 05/25/2017  . Current severe episode of major depressive disorder without psychotic features (HCC) 05/25/2017  . Generalized anxiety disorder 05/25/2017  . Contraceptive management 05/25/2017  . Cellulitis 10/01/2010  . Sore throat 03/04/2010  . PAP SMEAR, ABNORMAL, ASCUS 07/23/2009  . OBESITY, CLASS III 03/21/2009  . ANEMIA, IRON DEFICIENCY 12/17/2007   - Medications: reviewed and updated   Objective:   Physical Exam BP 124/74   Pulse 87   Temp 99.1 F (37.3 C) (Oral)   Ht 5\' 2"  (1.575 m)   Wt 260 lb (117.9 kg)   LMP 12/24/2017   SpO2 100%   BMI 47.55 kg/m  Gen: NAD, alert, cooperative with exam, appears tired HEENT: NCAT, PERRL, clear conjunctiva, slightly swollen tonsils without erythema or exudate, tender lymphadenopathy in the right cervical area CV: RRR, good S1/S2, no murmur  Resp: CTABL, no wheezes, non-labored Abd: SNTND,  BS present, no guarding or organomegaly Neuro: no gross deficits.  Psych: good insight, alert and oriented        Assessment & Plan:   Sore throat Differential includes strep throat, viral upper respiratory infection, and influenza.  Most likely diagnosis is viral upper respiratory infection due to patient's cough and age.  Her temperature after taking Tylenol is 99.1 and she does not have measured temperatures from home, so unsure if she has had temperatures high enough to make flu more likely.  She does have elementary age children who could have passed strep to her, but she does not have pharyngeal exudate and does have a cough, making this less likely.  Centor score is 1 for painful cervical adenopathy.  Will test for strep and if negative, will recommend conservative measures to improve patient comfort for a likely viral URI.  Rapid strep was positive.  Patient was prescribed penicillin 500 mg 3 times daily for 10 days and advised to stop taking the doxycycline while she is taking the penicillin since doxycycline can decrease its efficacy.    Lezlie OctaveAmanda Antario Yasuda, M.D. 01/07/2018, 4:20 PM PGY-2, [facility]

## 2018-01-07 NOTE — Patient Instructions (Signed)
It was nice seeing you today Ms. Denise Gillespie!  We have diagnosed you with strep throat today.  Please take penicillin 3 times a day for 10 days.  Please stop taking doxycycline while you are taking penicillin.  We are also giving you a work note to make sure you do not have to go into work until Monday.  Please let us know if you start to feel worse or if anything else changes.  If you have any questions or concerns, please feel free to call the clinic.   Be well,  Dr. Frances FurbishWinfrey

## 2018-01-07 NOTE — Assessment & Plan Note (Addendum)
Differential includes strep throat, viral upper respiratory infection, and influenza.  Most likely diagnosis is viral upper respiratory infection due to patient's cough and age.  Her temperature after taking Tylenol is 99.1 and she does not have measured temperatures from home, so unsure if she has had temperatures high enough to make flu more likely.  She does have elementary age children who could have passed strep to her, but she does not have pharyngeal exudate and does have a cough, making this less likely.  Centor score is 1 for painful cervical adenopathy.  Will test for strep and if negative, will recommend conservative measures to improve patient comfort for a likely viral URI.  Rapid strep was positive.  Patient was prescribed penicillin 500 mg 3 times daily for 10 days and advised to stop taking the doxycycline while she is taking the penicillin since doxycycline can decrease its efficacy.

## 2018-01-22 ENCOUNTER — Ambulatory Visit: Payer: 59 | Admitting: Family Medicine

## 2018-03-29 ENCOUNTER — Telehealth: Payer: Self-pay | Admitting: Family Medicine

## 2018-03-29 NOTE — Telephone Encounter (Signed)
No notes in chart about form being received. Not in Dr. Starr Sinclair box. Dr. Frances Furbish, have you seen these forms? To be safeBrooke is going to have FMLA paperwork re faxed.

## 2018-03-29 NOTE — Telephone Encounter (Signed)
Pt is calling to check on the status of her FMLA paper work being completed. Pt said it was faxed to our office last week. I told her we ask for at least a week to have forms completed and returned. She states that the forms are due tomorrow.   The best call back number is [phone].

## 2018-03-29 NOTE — Telephone Encounter (Signed)
Thanks Melvenia Beam.  I haven't seen these forms.

## 2018-04-14 ENCOUNTER — Telehealth: Payer: Self-pay | Admitting: Family Medicine

## 2018-04-14 NOTE — Telephone Encounter (Signed)
FMLA form dropped off for at front desk for completion.  Verified that patient section of form has been completed.  Last DOS/WCC with PCP was 01/07/18.  Placed form in team folder to be completed by clinical staff.  Chari Manning   Patient says PCP aware that form must be faxed by end of 04/15/18.

## 2018-04-14 NOTE — Telephone Encounter (Signed)
Clinical info completed on *FMLA  form.  Place form in PCP's box for completion.  Quinlan Mcfall, CMA   

## 2018-04-15 ENCOUNTER — Telehealth: Payer: Self-pay | Admitting: Family Medicine

## 2018-04-15 ENCOUNTER — Encounter: Payer: Self-pay | Admitting: Family Medicine

## 2018-04-15 NOTE — Telephone Encounter (Signed)
Patient called requesting phone call that she received but was asleep I read the messages to her about FMLA paperwork and told her that I'd ask you to give her a call back at her cell [phone].

## 2018-04-15 NOTE — Telephone Encounter (Signed)
Faxed to appropriate number and copy made for batch scanning.  

## 2018-04-15 NOTE — Telephone Encounter (Signed)
Form has been filled out and placed in nurse folder.  Thanks.

## 2018-04-21 ENCOUNTER — Telehealth: Payer: Self-pay

## 2018-04-21 NOTE — Telephone Encounter (Signed)
Denise Gillespie, representative for patients FMLA for employer, called nurse line stating the patient had exhausted all of her FMLA. The next step would be to do a ADA form. Instead of having [place] fill out a whole new form and fax back and forth, Denise Gillespie is asking for specifics on..."when patient has a flare up, what are her restrictions and limitations." For example, the patient is in too much pain, or cant drive, etc...   The answer can be called directly to [facility] [phone]. A VM can be left. Please advise.

## 2018-04-22 NOTE — Telephone Encounter (Signed)
Spoke with Meriam Sprague and told her that patient's flares of hydradenitis suppurativa prevent her from driving or sitting for long periods of time.  They also are so painful that they prevent her from concentrating and being productive at work.  Meriam Sprague said that this information was sufficient for her to work on getting her protection for her job under ADA.

## 2018-08-30 ENCOUNTER — Other Ambulatory Visit: Payer: Self-pay | Admitting: *Deleted

## 2018-08-30 DIAGNOSIS — L0390 Cellulitis, unspecified: Secondary | ICD-10-CM

## 2018-08-30 MED ORDER — MUPIROCIN 2 % EX OINT: 1.0000 "application " | TOPICAL_OINTMENT | Freq: Two times a day (BID) | CUTANEOUS | 0 refills | Status: DC

## 2018-08-30 MED ORDER — TRAZODONE HCL 50 MG PO TABS: 50.0000 mg | ORAL_TABLET | Freq: Every day | ORAL | 11 refills | Status: DC

## 2018-08-30 MED ORDER — BUSPIRONE HCL 7.5 MG PO TABS: 7.5000 mg | ORAL_TABLET | Freq: Two times a day (BID) | ORAL | 3 refills | Status: DC

## 2018-08-30 MED ORDER — DOXYCYCLINE HYCLATE 100 MG PO CAPS: 100.0000 mg | ORAL_CAPSULE | Freq: Every day | ORAL | 2 refills | Status: DC

## 2018-09-14 ENCOUNTER — Ambulatory Visit: Payer: 59 | Admitting: Family Medicine

## 2018-09-14 ENCOUNTER — Other Ambulatory Visit: Payer: Self-pay

## 2018-09-14 ENCOUNTER — Ambulatory Visit (INDEPENDENT_AMBULATORY_CARE_PROVIDER_SITE_OTHER): Payer: Self-pay | Admitting: Family Medicine

## 2018-09-14 ENCOUNTER — Encounter: Payer: Self-pay | Admitting: Family Medicine

## 2018-09-14 VITALS — BP 116/66 | HR 86 | Wt 267.8 lb

## 2018-09-14 DIAGNOSIS — Z3200 Encounter for pregnancy test, result unknown: Secondary | ICD-10-CM

## 2018-09-14 DIAGNOSIS — Z3A01 Less than 8 weeks gestation of pregnancy: Secondary | ICD-10-CM | POA: Diagnosis not present

## 2018-09-14 LAB — POCT URINE PREGNANCY: Preg Test, Ur: POSITIVE — AB

## 2018-09-14 MED ORDER — PRENATAL 27-0.8 MG PO TABS: 1.0000 | ORAL_TABLET | Freq: Every day | ORAL | 9 refills | Status: DC

## 2018-09-14 MED ORDER — PRENATAL 27-0.8 MG PO TABS: 1.0000 | ORAL_TABLET | Freq: Every day | ORAL | 0 refills | Status: DC

## 2018-09-14 NOTE — Patient Instructions (Signed)
Thank you for coming to see me today. It was a pleasure. Today we talked about:   You are about [redacted] weeks pregnant by your last period.  You will have blood work today.  Come back in 5 weeks for an hour long visit with Dr. Shan Levans for your first OB visit.  If you have pain or vaginal bleeding, you should be seen right away.  We will give you the info for your ultrasound scheduling.  Take your prenatal vitamins daily.  Please follow-up with Dr. Shan Levans in 5 weeks.  If you have any questions or concerns, please do not hesitate to call the office at [phone].  Best,   [place] Constable, DO

## 2018-09-14 NOTE — Progress Notes (Signed)
Subjective: Chief Complaint  Patient presents with  . Possible Pregnancy     HPI: Denise Gillespie is a 28 y.o. presenting to clinic today to discuss the following:  1 Amenorrhea LMP 08/10/2018, positive pregnancy test at home and today in office.  Plans to keep pregnancy.  No vaginal bleeding, cramping, leakage of fluid.  Desires to have pregnancy care at [facility], but has needed referrals to MFM in the past.   ROS noted in HPI.   Past Medical, Surgical, Social, and Family History Reviewed & Updated per EMR.   Pertinent Historical Findings include:   Social History   Tobacco Use  Smoking Status Current Some Day Smoker  . Packs/day: 0.25  . Types: Cigarettes  Smokeless Tobacco Never Used  Tobacco Comment   "cutting back"      Objective: BP 116/66   Pulse 86   Wt 267 lb 12.8 oz (121.5 kg)   LMP 07/11/2018 (Approximate)   SpO2 100%   BMI 48.98 kg/m  Vitals and nursing notes reviewed  Physical Exam:  General: 28 y.o. female in NAD Cardio: RRR no m/r/g Lungs: CTAB, no wheezing, no rhonchi, no crackles, no IWOB on RA Abdomen: Soft, non-tender to palpation, non-distended, positive bowel sounds Skin: warm and dry Extremities: No edema   Results for orders placed or performed in visit on 09/14/18 (from the past 72 hour(s))  POCT urine pregnancy     Status: Abnormal   Collection Time: 09/14/18 11:47 AM  Result Value Ref Range   Preg Test, Ur Positive (A) Negative    Assessment/Plan:  Less than [redacted] weeks gestation of pregnancy Positive pregnancy test in office.  By LMP is 5w.  Initial labs ordered and advised to make new Ob appointment in 5 weeks.  [place] scheduled for 10/6.  Will need urine obtained at next visit.  Patient will likely require referral to MFM at new OB visit and will likely benefit from initiation of ASA at 12 weeks for history of severe PreE.  Given rx for prenatal vitamins and advised to take.     PATIENT EDUCATION PROVIDED: See AVS     Diagnosis and plan along with any newly prescribed medication(s) were discussed in detail with this patient today. The patient verbalized understanding and agreed with the plan. Patient advised if symptoms worsen return to clinic or ER.   Health Maintainance:   Orders Placed This Encounter  Procedures  . US OB LESS THAN 14 WEEKS WITH OB TRANSVAGINAL    Standing Status:   Future    Standing Expiration Date:   11/14/2019    Order Specific Question:   Reason for Exam (SYMPTOM  OR DIAGNOSIS REQUIRED)    Answer:   pregnancy <14 wks    Order Specific Question:   Preferred Imaging Location?    Answer:   [facility]  . Obstetric Panel, Including HIV([facility])  . HGB FRAC. W/SOLUBILITY  . POCT urine pregnancy    Meds ordered this encounter  Medications  . DISCONTD: Prenatal Vit-Fe Fumarate-FA (MULTIVITAMIN-PRENATAL) 27-0.8 MG TABS tablet    Sig: Take 1 tablet by mouth daily at 12 noon.    Dispense:  30 tablet    Refill:  0  . Prenatal Vit-Fe Fumarate-FA (MULTIVITAMIN-PRENATAL) 27-0.8 MG TABS tablet    Sig: Take 1 tablet by mouth daily at 12 noon.    Dispense:  30 tablet    Refill:  [address] DO 09/14/2018, 12:18 PM PGY-2 [facility]

## 2018-09-14 NOTE — Assessment & Plan Note (Signed)
Positive pregnancy test in office.  By LMP is 5w.  Initial labs ordered and advised to make new Ob appointment in 5 weeks.  [place] scheduled for 10/6.  Will need urine obtained at next visit.  Patient will likely require referral to MFM at new OB visit and will likely benefit from initiation of ASA at 12 weeks for history of severe PreE.  Given rx for prenatal vitamins and advised to take.

## 2018-09-15 LAB — HGB FRAC. W/SOLUBILITY
Hgb A2 Quant: 2.3 % (ref 1.8–3.2)
Hgb A: 97.7 % (ref 96.4–98.8)
Hgb C: 0 %
Hgb F Quant: 0 % (ref 0.0–2.0)
Hgb S: 0 %
Hgb Solubility: NEGATIVE
Hgb Variant: 0 %

## 2018-09-15 LAB — OBSTETRIC PANEL, INCLUDING HIV
Antibody Screen: NEGATIVE
Basophils Absolute: 0 10*3/uL (ref 0.0–0.2)
Basos: 0 %
EOS (ABSOLUTE): 0.1 10*3/uL (ref 0.0–0.4)
Eos: 1 %
HIV Screen 4th Generation wRfx: NONREACTIVE
Hematocrit: 39.6 % (ref 34.0–46.6)
Hemoglobin: 12.3 g/dL (ref 11.1–15.9)
Hepatitis B Surface Ag: NEGATIVE
Immature Grans (Abs): 0 10*3/uL (ref 0.0–0.1)
Immature Granulocytes: 0 %
Lymphocytes Absolute: 2.1 10*3/uL (ref 0.7–3.1)
Lymphs: 30 %
MCH: 27.8 pg (ref 26.6–33.0)
MCHC: 31.1 g/dL — ABNORMAL LOW (ref 31.5–35.7)
MCV: 89 fL (ref 79–97)
Monocytes Absolute: 0.3 10*3/uL (ref 0.1–0.9)
Monocytes: 4 %
Neutrophils Absolute: 4.6 10*3/uL (ref 1.4–7.0)
Neutrophils: 65 %
Platelets: 296 10*3/uL (ref 150–450)
RBC: 4.43 x10E6/uL (ref 3.77–5.28)
RDW: 13.7 % (ref 11.7–15.4)
RPR Ser Ql: NONREACTIVE
Rh Factor: POSITIVE
Rubella Antibodies, IGG: 1.65 index (ref >0.99)
WBC: 7.1 10*3/uL (ref 3.4–10.8)

## 2018-09-23 ENCOUNTER — Inpatient Hospital Stay (HOSPITAL_COMMUNITY): Payer: Medicaid Other

## 2018-09-23 ENCOUNTER — Inpatient Hospital Stay (HOSPITAL_COMMUNITY)
Admission: AD | Admit: 2018-09-23 | Payer: Medicaid Other | Source: Home / Self Care | Admitting: Obstetrics and Gynecology

## 2018-09-23 ENCOUNTER — Encounter (HOSPITAL_COMMUNITY): Payer: Self-pay | Admitting: *Deleted

## 2018-09-23 ENCOUNTER — Other Ambulatory Visit: Payer: Self-pay

## 2018-09-23 ENCOUNTER — Inpatient Hospital Stay (HOSPITAL_COMMUNITY)
Admission: EM | Admit: 2018-09-23 | Discharge: 2018-09-23 | Disposition: A | Discharge status: Home / Self Care | Payer: Medicaid Other | Attending: Obstetrics and Gynecology | Admitting: Obstetrics and Gynecology

## 2018-09-23 DIAGNOSIS — O208 Other hemorrhage in early pregnancy: Secondary | ICD-10-CM | POA: Diagnosis not present

## 2018-09-23 DIAGNOSIS — O4691 Antepartum hemorrhage, unspecified, first trimester: Secondary | ICD-10-CM

## 2018-09-23 DIAGNOSIS — O360910 Maternal care for other rhesus isoimmunization, first trimester, not applicable or unspecified: Secondary | ICD-10-CM

## 2018-09-23 DIAGNOSIS — Z3A01 Less than 8 weeks gestation of pregnancy: Secondary | ICD-10-CM | POA: Diagnosis not present

## 2018-09-23 DIAGNOSIS — R102 Pelvic and perineal pain: Secondary | ICD-10-CM | POA: Diagnosis not present

## 2018-09-23 DIAGNOSIS — O418X10 Other specified disorders of amniotic fluid and membranes, first trimester, not applicable or unspecified: Secondary | ICD-10-CM | POA: Diagnosis not present

## 2018-09-23 DIAGNOSIS — O4690 Antepartum hemorrhage, unspecified, unspecified trimester: Secondary | ICD-10-CM

## 2018-09-23 DIAGNOSIS — Z79899 Other long term (current) drug therapy: Secondary | ICD-10-CM | POA: Diagnosis not present

## 2018-09-23 DIAGNOSIS — O468X1 Other antepartum hemorrhage, first trimester: Secondary | ICD-10-CM

## 2018-09-23 DIAGNOSIS — O26891 Other specified pregnancy related conditions, first trimester: Secondary | ICD-10-CM | POA: Diagnosis not present

## 2018-09-23 DIAGNOSIS — O99211 Obesity complicating pregnancy, first trimester: Secondary | ICD-10-CM | POA: Insufficient documentation

## 2018-09-23 DIAGNOSIS — O99331 Smoking (tobacco) complicating pregnancy, first trimester: Secondary | ICD-10-CM | POA: Insufficient documentation

## 2018-09-23 DIAGNOSIS — Z6790 Unspecified blood type, Rh positive: Secondary | ICD-10-CM

## 2018-09-23 HISTORY — DX: Gestational (pregnancy-induced) hypertension without significant proteinuria, unspecified trimester: O13.9

## 2018-09-23 LAB — CBC
HCT: 37.3 % (ref 36.0–46.0)
Hemoglobin: 12 g/dL (ref 12.0–15.0)
MCH: 28 pg (ref 26.0–34.0)
MCHC: 32.2 g/dL (ref 30.0–36.0)
MCV: 86.9 fL (ref 80.0–100.0)
Platelets: 287 10*3/uL (ref 150–400)
RBC: 4.29 MIL/uL (ref 3.87–5.11)
RDW: 14.4 % (ref 11.5–15.5)
WBC: 8.3 10*3/uL (ref 4.0–10.5)
nRBC: 0 % (ref 0.0–0.2)

## 2018-09-23 LAB — URINALYSIS, ROUTINE W REFLEX MICROSCOPIC
Bilirubin Urine: NEGATIVE
Glucose, UA: NEGATIVE mg/dL
Hgb urine dipstick: NEGATIVE
Ketones, ur: NEGATIVE mg/dL
Leukocytes,Ua: NEGATIVE
Nitrite: NEGATIVE
Protein, ur: NEGATIVE mg/dL
Specific Gravity, Urine: 1.031 — ABNORMAL HIGH (ref 1.005–1.030)
pH: 6 (ref 5.0–8.0)

## 2018-09-23 LAB — WET PREP, GENITAL
Clue Cells Wet Prep HPF POC: NONE SEEN
Sperm: NONE SEEN
Trich, Wet Prep: NONE SEEN
Yeast Wet Prep HPF POC: NONE SEEN

## 2018-09-23 LAB — HCG, QUANTITATIVE, PREGNANCY: hCG, Beta Chain, Quant, S: 32788 m[IU]/mL — ABNORMAL HIGH (ref <5)

## 2018-09-23 NOTE — ED Notes (Signed)
Called report to [facility] transport

## 2018-09-23 NOTE — MAU Note (Signed)
Presents with c/o VB.  Reports was heavier last night, passed quarter sized clots, now spotting with wiping.  Also reports constant sharp LRQ pain.

## 2018-09-23 NOTE — Discharge Instructions (Signed)
[facility] Providers     Subchorionic Hematoma  A subchorionic hematoma is a gathering of blood between the outer wall of the embryo (chorion) and the inner wall of the womb (uterus). This condition can cause vaginal bleeding. If they cause little or no vaginal bleeding, early small hematomas usually shrink on their own and do not affect your baby or pregnancy. When bleeding starts later in pregnancy, or if the hematoma is larger or occurs in older pregnant women, the condition may be more serious. Larger hematomas may get bigger, which increases the chances of miscarriage. This condition also increases the risk of:  Premature separation of the placenta from the uterus.  Premature (preterm) labor.  Stillbirth. What are the causes? The exact cause of this condition is not known. It occurs when blood is trapped between the placenta and the uterine wall because the placenta has separated from the original site of implantation. What increases the risk? You are more likely to develop this condition if:  You were treated with fertility medicines.  You conceived through in vitro fertilization (IVF). What are the signs or symptoms? Symptoms of this condition include:  Vaginal spotting or bleeding.  Contractions of the uterus. These cause abdominal pain. Sometimes you may have no symptoms and the bleeding may only be seen when ultrasound images are taken (transvaginal ultrasound). How is this diagnosed? This condition is diagnosed based on a physical exam. This includes a pelvic exam. You may also have other tests, including:  Blood tests.  Urine tests.  Ultrasound of the abdomen. How is this treated? Treatment for this condition can vary. Treatment may include:  Watchful waiting. You will be monitored closely for any changes in bleeding. During this stage: ? The hematoma may be reabsorbed by the body. ? The hematoma may separate the fluid-filled space containing the  embryo (gestational sac) from the wall of the womb (endometrium).  Medicines.  Activity restriction. This may be needed until the bleeding stops. Follow these instructions at home:  Stay on bed rest if told to do so by your health care provider.  Do not lift anything that is heavier than 10 lbs. (4.5 kg) or as told by your health care provider.  Do not use any products that contain nicotine or tobacco, such as cigarettes and e-cigarettes. If you need help quitting, ask your health care provider.  Track and write down the number of pads you use each day and how soaked (saturated) they are.  Do not use tampons.  Keep all follow-up visits as told by your health care provider. This is important. Your health care provider may ask you to have follow-up blood tests or ultrasound tests or both. Contact a health care provider if:  You have any vaginal bleeding.  You have a fever. Get help right away if:  You have severe cramps in your stomach, back, abdomen, or pelvis.  You pass large clots or tissue. Save any tissue for your health care provider to look at.  You have more vaginal bleeding, and you faint or become lightheaded or weak. Summary  A subchorionic hematoma is a gathering of blood between the outer wall of the placenta and the uterus.  This condition can cause vaginal bleeding.  Sometimes you may have no symptoms and the bleeding may only be seen when ultrasound images are taken.  Treatment may include watchful waiting, medicines, or activity restriction. This information is not intended to replace advice given to you by your health care  provider. Make sure you discuss any questions you have with your health care provider. Document Released: 04/16/2006 Document Revised: 12/12/2016 Document Reviewed: 02/26/2016 Elsevier Patient Education  2020 [employer]  [facility]     Phone: [phone]  [facility] [place]  Phone:  [phone]  [facility] [place]  Phone: [phone]  [facility]                           Phone: [phone]  [facility]          Phone: [phone]  [facility]    Phone: [phone]   [facility] [place]    Phone: [phone]  [facility]    Phone: [phone]  [facility]    Phone: [phone]  [facility]    Phone: [phone]  [facility]  Phone: [phone]  [facility]               Phone: [phone]  [facility]   Phone: [phone]  [facility]    Phone: [phone]                                  Safe Medications in Pregnancy    Acne: Benzoyl Peroxide Salicylic Acid  Backache/Headache: Tylenol: 2 regular strength every 4 hours OR              2 Extra strength every 6 hours  Colds/Coughs/Allergies: Benadryl (alcohol free) 25 mg every 6 hours as needed Breath right strips Claritin Cepacol throat lozenges Chloraseptic throat spray Cold-Eeze- up to three times per day Cough drops, alcohol free Flonase (by prescription only) Guaifenesin Mucinex Robitussin DM (plain only, alcohol free) Saline nasal spray/drops Sudafed (pseudoephedrine) & Actifed ** use only after [redacted] weeks gestation and if you do not have high blood pressure Tylenol Vicks Vaporub Zinc lozenges Zyrtec   Constipation: Colace Ducolax suppositories Fleet enema Glycerin suppositories Metamucil Milk of magnesia Miralax Senokot Smooth move tea  Diarrhea: Kaopectate Imodium A-D  *NO pepto Bismol  Hemorrhoids: Anusol Anusol HC Preparation H Tucks  Indigestion: Tums Maalox Mylanta Zantac  Pepcid  Insomnia: Benadryl (alcohol free) 25mg  every 6 hours as  needed Tylenol PM Unisom, no Gelcaps  Leg Cramps: Tums MagGel  Nausea/Vomiting:  Bonine Dramamine Emetrol Ginger extract Sea bands Meclizine  Nausea medication to take during pregnancy:  Unisom (doxylamine succinate 25 mg tablets) Take one tablet daily at bedtime. If symptoms are not adequately controlled, the dose can be increased to a maximum recommended dose of two tablets daily (1/2 tablet in the morning, 1/2 tablet mid-afternoon and one at bedtime). Vitamin B6 100mg  tablets. Take one tablet twice a day (up to 200 mg per day).  Skin Rashes: Aveeno products Benadryl cream or 25mg  every 6 hours as needed Calamine Lotion 1% cortisone cream  Yeast infection: Gyne-lotrimin 7 Monistat 7   **If taking multiple medications, please check labels to avoid duplicating the same active ingredients **take medication as directed on the label ** Do not exceed 4000 mg of tylenol in 24 hours **Do not take medications that contain aspirin or ibuprofen

## 2018-09-23 NOTE — MAU Provider Note (Signed)
History     CSN: [id number]  Arrival date and time: 09/23/18 0932   First Provider Initiated Contact with Patient 09/23/18 1819      Chief Complaint  Patient presents with  . Vaginal Bleeding   Ms. Denise Gillespie is a 28 y.o. G3P2002 at [redacted]w[redacted]d who presents to MAU for vaginal bleeding which began last night. Pt reports the bleeding last night was "like a period," but is less currently. Pt reports she only sees bleeding now when she is wiping after using the restroom, intermittently.  Passing blood clots? Yes, quarter size clots last night, smaller this morning Blood soaking clothes? no Lightheaded/dizzy? no Significant pelvic pain or cramping? Yes, on right side, rates 8/10, constant Passed any tissue? no Hx ectopic pregnancy? no Hx of PID, GYN surgery? no  Current pregnancy problems? Hx of preeclampsia Blood Type? A Positive Allergies? NKDA Current medications? Buspar, trazadone, zoloft, PNVs Current PNC & next appt? Requests list of OB providers  Pt denies vaginal discharge/odor/itching. Pt denies N/V, abdominal pain, constipation, diarrhea, or urinary problems. Pt denies fever, chills, fatigue, sweating or changes in appetite. Pt denies SOB or chest pain. Pt denies dizziness, HA, light-headedness, weakness.   OB History    Gravida  3   Para  2   Term  2   Preterm      AB      Living  2     SAB      TAB      Ectopic      Multiple      Live Births  2           Past Medical History:  Diagnosis Date  . Abscess    multiple  . Anemia, iron deficiency   . Obesity   . Pregnancy induced hypertension     Past Surgical History:  Procedure Laterality Date  . CESAREAN SECTION    . INCISE AND DRAIN ABCESS      Family History  Problem Relation Age of Onset  . Obesity Mother   . Heart disease Mother   . Hypertension Mother   . Migraines Mother   . Eczema Brother     Social History   Tobacco Use  . Smoking status: Current Some Day  Smoker    Packs/day: 0.25    Types: Cigarettes  . Smokeless tobacco: Never Used  . Tobacco comment: "cutting back"  Substance Use Topics  . Alcohol use: Yes    Comment: Social  . Drug use: No    Allergies: No Known Allergies  Medications Prior to Admission  Medication Sig Dispense Refill Last Dose  . Prenatal Vit-Fe Fumarate-FA (MULTIVITAMIN-PRENATAL) 27-0.8 MG TABS tablet Take 1 tablet by mouth daily at 12 noon. 30 tablet 9 09/23/2018 at 0930  . busPIRone (BUSPAR) 7.5 MG tablet Take 1 tablet (7.5 mg total) by mouth 2 (two) times daily. 30 tablet 3   . doxycycline (VIBRAMYCIN) 100 MG capsule Take 1 capsule (100 mg total) by mouth daily. 30 capsule 2   . HYDROcodone-acetaminophen (NORCO) 5-325 MG tablet Take 1 tablet by mouth every 6 (six) hours as needed. 6 tablet 0   . mupirocin ointment (BACTROBAN) 2 % Place 1 application into the nose 2 (two) times daily. 22 g 0 More than a month at Unknown time  . sertraline (ZOLOFT) 100 MG tablet Take 0.5 tablets (50 mg total) by mouth daily. 30 tablet 3   . traZODone (DESYREL) 50 MG tablet Take 1 tablet (50 mg  total) by mouth at bedtime. 30 tablet 11     Review of Systems  Constitutional: Negative for chills, diaphoresis, fatigue and fever.  Respiratory: Negative for shortness of breath.   Cardiovascular: Negative for chest pain.  Gastrointestinal: Negative for abdominal pain, constipation, diarrhea, nausea and vomiting.  Genitourinary: Positive for pelvic pain and vaginal bleeding. Negative for dysuria, flank pain, frequency, urgency and vaginal discharge.  Neurological: Negative for dizziness, weakness, light-headedness and headaches.   Physical Exam   Blood pressure 128/75, pulse 83, temperature 98.8 F (37.1 C), temperature source Oral, resp. rate 18, last menstrual period 08/10/2018, SpO2 99 %.  Patient Vitals for the past 24 hrs:  BP Temp Temp src Pulse Resp SpO2  09/23/18 1807 128/75 98.8 F (37.1 C) Oral 83 18 99 %  09/23/18 1712  129/83 98.2 F (36.8 C) Oral 81 17 100 %   Physical Exam  Constitutional: She is oriented to person, place, and time. She appears well-developed and well-nourished. No distress.  HENT:  Head: Normocephalic and atraumatic.  Respiratory: Effort normal.  GI: Soft. She exhibits no distension and no mass. There is no abdominal tenderness. There is no rebound and no guarding.  Genitourinary: There is no rash, tenderness or lesion on the right labia. There is no rash, tenderness or lesion on the left labia. Uterus is not enlarged and not tender. Cervix exhibits discharge (minimal amount of mucus-like discharge with single strand of bright-red blood protruding from external os, no active bleeding noted). Cervix exhibits no motion tenderness and no friability.    No vaginal discharge, tenderness or bleeding.  No tenderness or bleeding in the vagina.    Genitourinary Comments: -cervix long and closed on exam   Neurological: She is alert and oriented to person, place, and time.  Skin: Skin is warm and dry. She is not diaphoretic.  Psychiatric: She has a normal mood and affect. Her behavior is normal. Judgment and thought content normal.   Results for orders placed or performed during the hospital encounter of 09/23/18 (from the past 24 hour(s))  CBC     Status: None   Collection Time: 09/23/18  5:47 PM  Result Value Ref Range   WBC 8.3 4.0 - 10.5 K/uL   RBC 4.29 3.87 - 5.11 MIL/uL   Hemoglobin 12.0 12.0 - 15.0 g/dL   HCT 64.1 58.3 - 09.4 %   MCV 86.9 80.0 - 100.0 fL   MCH 28.0 26.0 - 34.0 pg   MCHC 32.2 30.0 - 36.0 g/dL   RDW 07.6 80.8 - 81.1 %   Platelets 287 150 - 400 K/uL   nRBC 0.0 0.0 - 0.2 %  hCG, quantitative, pregnancy     Status: Abnormal   Collection Time: 09/23/18  5:47 PM  Result Value Ref Range   hCG, Beta Chain, Quant, S 32,788 (H) <5 mIU/mL  Wet prep, genital     Status: Abnormal   Collection Time: 09/23/18  6:35 PM   Specimen: Cervical/Vaginal swab  Result Value Ref Range    Yeast Wet Prep HPF POC NONE SEEN NONE SEEN   Trich, Wet Prep NONE SEEN NONE SEEN   Clue Cells Wet Prep HPF POC NONE SEEN NONE SEEN   WBC, Wet Prep HPF POC FEW (A) NONE SEEN   Sperm NONE SEEN   Urinalysis, Routine w reflex microscopic     Status: Abnormal   Collection Time: 09/23/18  6:53 PM  Result Value Ref Range   Color, Urine YELLOW YELLOW   APPearance  CLEAR CLEAR   Specific Gravity, Urine 1.031 (H) 1.005 - 1.030   pH 6.0 5.0 - 8.0   Glucose, UA NEGATIVE NEGATIVE mg/dL   Hgb urine dipstick NEGATIVE NEGATIVE   Bilirubin Urine NEGATIVE NEGATIVE   Ketones, ur NEGATIVE NEGATIVE mg/dL   Protein, ur NEGATIVE NEGATIVE mg/dL   Nitrite NEGATIVE NEGATIVE   Leukocytes,Ua NEGATIVE NEGATIVE   [place] Ob Less Than 14 Weeks With Ob Transvaginal  Result Date: 09/23/2018 CLINICAL DATA:  Vaginal bleeding EXAM: OBSTETRIC <14 WK US AND TRANSVAGINAL OB US TECHNIQUE: Both transabdominal and transvaginal ultrasound examinations were performed for complete evaluation of the gestation as well as the maternal uterus, adnexal regions, and pelvic cul-de-sac. Transvaginal technique was performed to assess early pregnancy. COMPARISON:  None. FINDINGS: Intrauterine gestational sac: Single Yolk sac:  Visualized Embryo:  Visualized Cardiac Activity: Visualized Heart Rate: 125 bpm MSD:   mm    w     d CRL:  5.3 mm   6 w   1 d                  US EDC: 05/18/2019 Subchorionic hemorrhage:  Large subchorionic hemorrhage Maternal uterus/adnexae: No adnexal mass.  Trace free fluid. IMPRESSION: Six week 1 day intrauterine pregnancy. Fetal heart rate 125 beats per minute. Large subchorionic hemorrhage. Electronically Signed   By: Charlett NoseKevin  Dover M.D.   On: 09/23/2018 19:51    MAU Course  Procedures  MDM -r/o ectopic -UA: SG 1.031, otherwise WNL -CBC: WNL -US: single IUP, +yolk sac, FHR 125, 5763w1d, large subchorionic hemorrhage, trace free fluid -hCG: 82,95632,788 -ABO: A Positive -WetPrep: few WBCs, otherwise WNL -GC/CT  collected -pt discharged to home in stable condition  Orders Placed This Encounter  Procedures  . Wet prep, genital    Standing Status:   Standing    Number of Occurrences:   1  . US OB LESS THAN 14 WEEKS WITH OB TRANSVAGINAL    Standing Status:   Standing    Number of Occurrences:   1    Order Specific Question:   Symptom/Reason for Exam    Answer:   Vaginal bleeding in pregnancy [[id number]]  . CBC    Standing Status:   Standing    Number of Occurrences:   1  . hCG, quantitative, pregnancy    Standing Status:   Standing    Number of Occurrences:   1  . Urinalysis, Routine w reflex microscopic    Standing Status:   Standing    Number of Occurrences:   1  . Discharge patient    Order Specific Question:   Discharge disposition    Answer:   01-Home or Self Care [1]    Order Specific Question:   Discharge patient date    Answer:   09/23/2018   No orders of the defined types were placed in this encounter.  Assessment and Plan   1. Subchorionic hemorrhage of placenta in first trimester, single or unspecified fetus   2. Vaginal bleeding in pregnancy   3. Pelvic cramping   4. Blood type, Rh positive    Allergies as of 09/23/2018   No Known Allergies     Medication List    STOP taking these medications   doxycycline 100 MG capsule Commonly known as: VIBRAMYCIN   HYDROcodone-acetaminophen 5-325 MG tablet Commonly known as: Norco     TAKE these medications   busPIRone 7.5 MG tablet Commonly known as: BUSPAR Take 1 tablet (7.5 mg total) by mouth 2 (  two) times daily.   multivitamin-prenatal 27-0.8 MG Tabs tablet Take 1 tablet by mouth daily at 12 noon.   mupirocin ointment 2 % Commonly known as: Bactroban Place 1 application into the nose 2 (two) times daily.   sertraline 100 MG tablet Commonly known as: ZOLOFT Take 0.5 tablets (50 mg total) by mouth daily.   traZODone 50 MG tablet Commonly known as: DESYREL Take 1 tablet (50 mg total) by mouth at bedtime.       -will call with culture results, if positive -discussed subchorionic hematoma meaning and relationship to miscarriage -discussed appropriate hydration in pregnancy -pt to call provider to discuss continuation of psychiatric medications in pregnancy -strict SAB/return MAU precautions given -pt discharged to home in stable condition  Joni Reiningicole E Nugent 09/23/2018, 8:13 PM

## 2018-09-25 LAB — GC/CHLAMYDIA PROBE AMP (~~LOC~~) NOT AT ARMC
Chlamydia: NEGATIVE
Neisseria Gonorrhea: NEGATIVE

## 2018-09-27 ENCOUNTER — Ambulatory Visit: Payer: 59

## 2018-10-19 ENCOUNTER — Other Ambulatory Visit: Payer: Self-pay | Admitting: Family Medicine

## 2018-10-19 ENCOUNTER — Other Ambulatory Visit: Payer: Self-pay

## 2018-10-19 ENCOUNTER — Ambulatory Visit (HOSPITAL_COMMUNITY)
Admission: RE | Admit: 2018-10-19 | Discharge: 2018-10-19 | Disposition: A | Discharge status: Home / Self Care | Payer: Medicaid Other | Source: Ambulatory Visit | Attending: Family Medicine | Admitting: Family Medicine

## 2018-10-19 DIAGNOSIS — O3680X0 Pregnancy with inconclusive fetal viability, not applicable or unspecified: Secondary | ICD-10-CM | POA: Insufficient documentation

## 2018-10-19 DIAGNOSIS — Z3A10 10 weeks gestation of pregnancy: Secondary | ICD-10-CM | POA: Insufficient documentation

## 2018-10-19 DIAGNOSIS — Z3A01 Less than 8 weeks gestation of pregnancy: Secondary | ICD-10-CM

## 2018-10-19 DIAGNOSIS — O208 Other hemorrhage in early pregnancy: Secondary | ICD-10-CM | POA: Diagnosis not present

## 2018-10-20 ENCOUNTER — Telehealth: Payer: Self-pay | Admitting: Family Medicine

## 2018-10-20 NOTE — Telephone Encounter (Signed)
Called patient with results. Patient does not yet have initial OB scheduled. Desires to begin care at [facility].   Nursing- please call patient and schedule initial Ob for 60 minutes.   Let me know if you have questions.  Dorris Singh, MD  Family Medicine Teaching Service

## 2018-10-21 NOTE — Telephone Encounter (Signed)
LMOVM informing pt to call back and schedule initial OB visit 60 min. Deseree Kennon Holter, CMA

## 2018-10-27 ENCOUNTER — Inpatient Hospital Stay (HOSPITAL_COMMUNITY)
Admission: EM | Admit: 2018-10-27 | Discharge: 2018-10-28 | Disposition: A | Discharge status: Home / Self Care | Payer: Medicaid Other | Attending: Obstetrics and Gynecology | Admitting: Obstetrics and Gynecology

## 2018-10-27 ENCOUNTER — Other Ambulatory Visit: Payer: Self-pay

## 2018-10-27 DIAGNOSIS — E669 Obesity, unspecified: Secondary | ICD-10-CM | POA: Insufficient documentation

## 2018-10-27 DIAGNOSIS — O99211 Obesity complicating pregnancy, first trimester: Secondary | ICD-10-CM | POA: Insufficient documentation

## 2018-10-27 DIAGNOSIS — Z6790 Unspecified blood type, Rh positive: Secondary | ICD-10-CM

## 2018-10-27 DIAGNOSIS — O209 Hemorrhage in early pregnancy, unspecified: Secondary | ICD-10-CM | POA: Insufficient documentation

## 2018-10-27 DIAGNOSIS — Z3A11 11 weeks gestation of pregnancy: Secondary | ICD-10-CM | POA: Insufficient documentation

## 2018-10-27 DIAGNOSIS — O99011 Anemia complicating pregnancy, first trimester: Secondary | ICD-10-CM | POA: Insufficient documentation

## 2018-10-27 DIAGNOSIS — O468X1 Other antepartum hemorrhage, first trimester: Secondary | ICD-10-CM

## 2018-10-27 DIAGNOSIS — Z79899 Other long term (current) drug therapy: Secondary | ICD-10-CM | POA: Insufficient documentation

## 2018-10-27 DIAGNOSIS — D509 Iron deficiency anemia, unspecified: Secondary | ICD-10-CM | POA: Insufficient documentation

## 2018-10-27 DIAGNOSIS — O418X10 Other specified disorders of amniotic fluid and membranes, first trimester, not applicable or unspecified: Secondary | ICD-10-CM

## 2018-10-27 DIAGNOSIS — Z87891 Personal history of nicotine dependence: Secondary | ICD-10-CM | POA: Insufficient documentation

## 2018-10-27 NOTE — MAU Note (Signed)
Pt reports to MAU from [place] stating around 2130 she had some heavy vaginal bleeding. Pt reports she has changed 2 pads. Pt states occasionally it is only when she wipes and other times it is heavier. Pt reports it felt like she peed on herself. Pt reports passing small clots pt reports they were dark red. Pt denies abdominal pain. No other symptoms noted.  266.3lbs

## 2018-10-27 NOTE — ED Triage Notes (Signed)
Pt reports vaginal bleeding and abd pain that started around 8pm. States she has changed 2 pads since then.

## 2018-10-28 ENCOUNTER — Encounter (HOSPITAL_COMMUNITY): Payer: Self-pay | Admitting: *Deleted

## 2018-10-28 DIAGNOSIS — E669 Obesity, unspecified: Secondary | ICD-10-CM | POA: Diagnosis not present

## 2018-10-28 DIAGNOSIS — O360910 Maternal care for other rhesus isoimmunization, first trimester, not applicable or unspecified: Secondary | ICD-10-CM

## 2018-10-28 DIAGNOSIS — O99011 Anemia complicating pregnancy, first trimester: Secondary | ICD-10-CM | POA: Diagnosis not present

## 2018-10-28 DIAGNOSIS — Z79899 Other long term (current) drug therapy: Secondary | ICD-10-CM | POA: Diagnosis not present

## 2018-10-28 DIAGNOSIS — O468X1 Other antepartum hemorrhage, first trimester: Secondary | ICD-10-CM

## 2018-10-28 DIAGNOSIS — Z3A11 11 weeks gestation of pregnancy: Secondary | ICD-10-CM

## 2018-10-28 DIAGNOSIS — O418X10 Other specified disorders of amniotic fluid and membranes, first trimester, not applicable or unspecified: Secondary | ICD-10-CM

## 2018-10-28 DIAGNOSIS — Z87891 Personal history of nicotine dependence: Secondary | ICD-10-CM | POA: Diagnosis not present

## 2018-10-28 DIAGNOSIS — D509 Iron deficiency anemia, unspecified: Secondary | ICD-10-CM | POA: Diagnosis not present

## 2018-10-28 DIAGNOSIS — O99211 Obesity complicating pregnancy, first trimester: Secondary | ICD-10-CM | POA: Diagnosis not present

## 2018-10-28 DIAGNOSIS — O209 Hemorrhage in early pregnancy, unspecified: Secondary | ICD-10-CM | POA: Diagnosis not present

## 2018-10-28 LAB — URINALYSIS, ROUTINE W REFLEX MICROSCOPIC
Bilirubin Urine: NEGATIVE
Glucose, UA: NEGATIVE mg/dL
Ketones, ur: NEGATIVE mg/dL
Leukocytes,Ua: NEGATIVE
Nitrite: NEGATIVE
Protein, ur: 30 mg/dL — AB
RBC / HPF: >50 RBC/hpf — ABNORMAL HIGH (ref 0–5)
Specific Gravity, Urine: 1.006 (ref 1.005–1.030)
pH: 7 (ref 5.0–8.0)

## 2018-10-28 NOTE — MAU Provider Note (Signed)
History     CSN: [id number]  Arrival date and time: 10/27/18 2226   First Provider Initiated Contact with Patient 10/28/18 0017      Chief Complaint  Patient presents with  . Vaginal Bleeding   Denise Gillespie is a 28 y.o. G3P2 at [redacted]w[redacted]d who presents to MAU with complaints of vaginal bleeding. She reports vaginal bleeding started occurring around 2130- she reports having to change 2 pads from 2130 to now. Describes the vaginal bleeding as dark red with small clots that was heavy initially then began to lighten and now it only occurs when she wipes. She denies recent IC or extraneous activity. She reports being diagnosed with a SCH on 9/10 and has been having occasional bleeding since but this was heavier. She denies abdominal pain or cramping. Plans to receive prenatal care at [facility] family practice- plans to call to schedule appointment today.     OB History    Gravida  3   Para  2   Term  2   Preterm      AB      Living  2     SAB      TAB      Ectopic      Multiple      Live Births  2           Past Medical History:  Diagnosis Date  . Abscess    multiple  . Anemia, iron deficiency   . Obesity   . Pregnancy induced hypertension     Past Surgical History:  Procedure Laterality Date  . CESAREAN SECTION    . INCISE AND DRAIN ABCESS      Family History  Problem Relation Age of Onset  . Obesity Mother   . Heart disease Mother   . Hypertension Mother   . Migraines Mother   . Eczema Brother     Social History   Tobacco Use  . Smoking status: Former Smoker    Packs/day: 0.25    Types: Cigarettes  . Smokeless tobacco: Never Used  . Tobacco comment: "cutting back"  Substance Use Topics  . Alcohol use: Yes    Comment: Social  . Drug use: No    Allergies: No Known Allergies  Medications Prior to Admission  Medication Sig Dispense Refill Last Dose  . acetaminophen (TYLENOL) 500 MG tablet Take 1,000 mg by mouth every 6 (six) hours as  needed.   10/27/2018 at Unknown time  . Prenatal Vit-Fe Fumarate-FA (MULTIVITAMIN-PRENATAL) 27-0.8 MG TABS tablet Take 1 tablet by mouth daily at 12 noon. 30 tablet 9 10/27/2018 at Unknown time  . busPIRone (BUSPAR) 7.5 MG tablet Take 1 tablet (7.5 mg total) by mouth 2 (two) times daily. 30 tablet 3 More than a month at Unknown time  . mupirocin ointment (BACTROBAN) 2 % Place 1 application into the nose 2 (two) times daily. 22 g 0 More than a month at Unknown time  . sertraline (ZOLOFT) 100 MG tablet Take 0.5 tablets (50 mg total) by mouth daily. 30 tablet 3 More than a month at Unknown time  . traZODone (DESYREL) 50 MG tablet Take 1 tablet (50 mg total) by mouth at bedtime. 30 tablet 11 More than a month at Unknown time    Review of Systems  Constitutional: Negative.   Respiratory: Negative.   Cardiovascular: Negative.   Gastrointestinal: Negative.   Genitourinary: Positive for vaginal bleeding. Negative for difficulty urinating, dysuria, frequency, hematuria, pelvic pain, urgency and vaginal  discharge.  Musculoskeletal: Negative.   Neurological: Negative.    Physical Exam   Blood pressure 121/75, pulse 82, temperature 99 F (37.2 C), temperature source Oral, resp. rate 18, weight 120.8 kg, last menstrual period 08/10/2018, SpO2 100 %.  Physical Exam  Nursing note and vitals reviewed. Constitutional: She is oriented to person, place, and time. She appears well-developed and well-nourished. No distress.  Cardiovascular: Normal rate and regular rhythm.  Respiratory: Effort normal and breath sounds normal. No respiratory distress. She has no wheezes.  GI: Soft. She exhibits no distension. There is no abdominal tenderness. There is no rebound and no guarding.  Genitourinary:    Vaginal bleeding present.  There is bleeding in the vagina.    Genitourinary Comments: Pelvic exam: Cervix pink, visually closed, without lesion, scant amount of dark red old blood present, vaginal walls and external  genitalia normal Bimanual exam: Cervix 0/long/high, firm, anterior, neg CMT, uterus nontender, nonenlarged, adnexa without tenderness, enlargement, or mass   Musculoskeletal: Normal range of motion.        General: No edema.  Neurological: She is alert and oriented to person, place, and time.  Psychiatric: She has a normal mood and affect. Her behavior is normal. Thought content normal.   Pt informed that the ultrasound is considered a limited OB ultrasound and is not intended to be a complete ultrasound exam.  Patient also informed that the ultrasound is not being completed with the intent of assessing for fetal or placental anomalies or any pelvic abnormalities.  Explained that the purpose of today's ultrasound is to assess for  viability.  Patient acknowledges the purpose of the exam and the limitations of the study.    FHR 170 by bedside US  MAU Course  Procedures  MDM Bedside US  Pelvic examination   Educated and discussed process of [facility] and what to expect. Encouraged to make prenatal appointment. Discussed reasons to return to MAU. Pt stable at time of discharge   Assessment and Plan   1. Subchorionic hemorrhage of placenta in first trimester, single or unspecified fetus   2. Vaginal bleeding in pregnancy, first trimester   3. [redacted] weeks gestation of pregnancy   4. Blood type, Rh positive    Discharge home Make initial prenatal appointment  Pelvic rest and hydration  [facility] precautions  Return to MAU as needed   [place] 10/28/2018, 1:05 AM

## 2018-11-16 ENCOUNTER — Ambulatory Visit (INDEPENDENT_AMBULATORY_CARE_PROVIDER_SITE_OTHER): Payer: Medicaid Other | Admitting: *Deleted

## 2018-11-16 ENCOUNTER — Other Ambulatory Visit: Payer: Self-pay

## 2018-11-16 DIAGNOSIS — O0990 Supervision of high risk pregnancy, unspecified, unspecified trimester: Secondary | ICD-10-CM | POA: Insufficient documentation

## 2018-11-16 DIAGNOSIS — O0991 Supervision of high risk pregnancy, unspecified, first trimester: Secondary | ICD-10-CM | POA: Insufficient documentation

## 2018-11-16 DIAGNOSIS — F329 Major depressive disorder, single episode, unspecified: Secondary | ICD-10-CM | POA: Insufficient documentation

## 2018-11-16 DIAGNOSIS — Z8759 Personal history of other complications of pregnancy, childbirth and the puerperium: Secondary | ICD-10-CM

## 2018-11-16 DIAGNOSIS — O99210 Obesity complicating pregnancy, unspecified trimester: Secondary | ICD-10-CM

## 2018-11-16 DIAGNOSIS — F3289 Other specified depressive episodes: Secondary | ICD-10-CM

## 2018-11-16 DIAGNOSIS — F411 Generalized anxiety disorder: Secondary | ICD-10-CM

## 2018-11-16 DIAGNOSIS — F32A Depression, unspecified: Secondary | ICD-10-CM | POA: Insufficient documentation

## 2018-11-16 NOTE — Progress Notes (Signed)
I connected with  Denise Gillespie on 11/16/18 at  1:30 PM EST by telephone and verified that I am speaking with the correct person using two identifiers.   I discussed the limitations, risks, security and privacy concerns of performing an evaluation and management service by telephone and the availability of in person appointments. I also discussed with the patient that there may be a patient responsible charge related to this service. The patient expressed understanding and agreed to proceed. Explained I am completing her New OB Intake today. We discussed that she has an ob appointment also scheduled with Family Medicine. She states she is going to get her prenatal care with [place] and will cancel that appointment. I explained she can't get prenatal care in 2 offices. We discussed Her EDD and that it is based on  sure LMP that is within days and agrees with [place]. I reviewed her allergies, meds, OB History, Medical /Surgical history, and appropriate screenings.  She does reports symptoms of covid that just started. I advised her to get covid testing at [place] and if negative proceed with ob visit as scheduled. If positive - call us for further instructions. I explained I will send her the [employer] app- app sent to her while on phone.  She states she has her own blood pressure cuff and knows how to use it.  Explained after her first ob appt; we will have her take her blood pressure weekly and enter into the app. Explained she will have some visits in office and some virtually. She already has Community education officer. Reviewed appointment date/ time with her , our location and to wear mask, no visitors. Explained she will have exam, ob bloodwork, hemoglobin a1C, cbg , genetic testing if desired, pap if needed. I scheduled an [place] at 19 weeks and gave her the appointment. She voices understanding.  Linda,RN 11/16/2018  1:27 PM

## 2018-11-16 NOTE — Patient Instructions (Signed)

## 2018-11-17 ENCOUNTER — Other Ambulatory Visit: Payer: Self-pay

## 2018-11-17 DIAGNOSIS — Z20828 Contact with and (suspected) exposure to other viral communicable diseases: Secondary | ICD-10-CM | POA: Diagnosis not present

## 2018-11-17 DIAGNOSIS — Z20822 Contact with and (suspected) exposure to covid-19: Secondary | ICD-10-CM

## 2018-11-18 LAB — NOVEL CORONAVIRUS, NAA: SARS-CoV-2, NAA: NOT DETECTED

## 2018-11-30 ENCOUNTER — Other Ambulatory Visit: Payer: Self-pay

## 2018-11-30 ENCOUNTER — Ambulatory Visit (INDEPENDENT_AMBULATORY_CARE_PROVIDER_SITE_OTHER): Payer: Medicaid Other | Admitting: Obstetrics & Gynecology

## 2018-11-30 ENCOUNTER — Other Ambulatory Visit: Payer: Self-pay | Admitting: Obstetrics & Gynecology

## 2018-11-30 ENCOUNTER — Ambulatory Visit: Payer: Medicaid Other | Admitting: Clinical

## 2018-11-30 ENCOUNTER — Other Ambulatory Visit (HOSPITAL_COMMUNITY)
Admission: RE | Admit: 2018-11-30 | Discharge: 2018-11-30 | Disposition: A | Discharge status: Home / Self Care | Payer: Medicaid Other | Source: Ambulatory Visit | Attending: Obstetrics & Gynecology | Admitting: Obstetrics & Gynecology

## 2018-11-30 VITALS — BP 119/79 | HR 82 | Wt 267.7 lb

## 2018-11-30 DIAGNOSIS — O0990 Supervision of high risk pregnancy, unspecified, unspecified trimester: Secondary | ICD-10-CM | POA: Diagnosis not present

## 2018-11-30 DIAGNOSIS — O0992 Supervision of high risk pregnancy, unspecified, second trimester: Secondary | ICD-10-CM

## 2018-11-30 DIAGNOSIS — Z8759 Personal history of other complications of pregnancy, childbirth and the puerperium: Secondary | ICD-10-CM | POA: Diagnosis not present

## 2018-11-30 DIAGNOSIS — R87612 Low grade squamous intraepithelial lesion on cytologic smear of cervix (LGSIL): Secondary | ICD-10-CM

## 2018-11-30 DIAGNOSIS — O09512 Supervision of elderly primigravida, second trimester: Secondary | ICD-10-CM | POA: Diagnosis not present

## 2018-11-30 DIAGNOSIS — O99210 Obesity complicating pregnancy, unspecified trimester: Secondary | ICD-10-CM

## 2018-11-30 DIAGNOSIS — Z98891 History of uterine scar from previous surgery: Secondary | ICD-10-CM | POA: Insufficient documentation

## 2018-11-30 DIAGNOSIS — O99212 Obesity complicating pregnancy, second trimester: Secondary | ICD-10-CM

## 2018-11-30 DIAGNOSIS — O34219 Maternal care for unspecified type scar from previous cesarean delivery: Secondary | ICD-10-CM

## 2018-11-30 DIAGNOSIS — Z3A16 16 weeks gestation of pregnancy: Secondary | ICD-10-CM

## 2018-11-30 DIAGNOSIS — Z8659 Personal history of other mental and behavioral disorders: Secondary | ICD-10-CM

## 2018-11-30 NOTE — Patient Instructions (Signed)

## 2018-11-30 NOTE — BH Specialist Note (Signed)
Integrated Behavioral Health Initial Visit  MRN: [id number] Name: Denise Gillespie  Number of [place] Clinician visits:: 1/6 Session Start time: 2:14  Session End time: 2:20 Total time: 15  Type of Service: [place] Interpretor:No. Interpretor Name and Language: n/a   Warm Hand Off Completed.       SUBJECTIVE: Denise Gillespie is a 28 y.o. female accompanied by n/a Patient was referred by Emeterio Reeve, MD for hx depression. Patient reports the following symptoms/concerns: Pt states she has a history of depression, is not currently on any medication (previously Zoloft, Trazadone; Buspar). Pt's primary concern is mild sleep difficulty.  Duration of problem: Current pregnancy; Severity of problem: mild  OBJECTIVE: Mood: Anxious and Affect: Appropriate Risk of harm to self or others: No plan to harm self or others  LIFE CONTEXT: Family and Social: - School/Work: - Self-Care: - Life Changes: Current pregnancy  GOALS ADDRESSED: Patient will: 1. Maintain reduction of symptoms of: anxiety and depression 2. Increase knowledge and/or ability of: healthy habits  3. Demonstrate ability to: Increase healthy adjustment to current life circumstances  INTERVENTIONS: Interventions utilized: Psychoeducation and/or Health Education  Standardized Assessments completed: GAD-7 and PHQ 9  ASSESSMENT: Patient currently experiencing History of depression.   Patient may benefit from psychoeducation and brief therapeutic interventions regarding maintaining reduction of symptoms of depression and anxiety .  PLAN: 1. Follow up with behavioral health clinician on : One month, earlier if needed 2. Behavioral recommendations:  -Continue taking prenatal vitamins, as recommended by medical provider -Consider sleep as priority (consider using sleep sound app); continue if helpful, and will discuss further at follow up  visit 3. Referral(s): [place] (In Clinic) 4. "From scale of 1-10, how likely are you to follow plan?": -  Garlan Fair, LCSW   Depression screen [facility] 2/9 11/30/2018 11/30/2018 11/16/2018 01/07/2018 09/09/2017  Decreased Interest 1 1 3  0 3  Down, Depressed, Hopeless 1 2 1  0 2  PHQ - 2 Score 2 3 4  0 5  Altered sleeping 2 2 3  - 1  Tired, decreased energy 1 1 1  - 3  Change in appetite 0 0 1 - 2  Feeling bad or failure about yourself  0 0 0 - 2  Trouble concentrating 0 0 0 - 3  Moving slowly or fidgety/restless 0 1 0 - 2  Suicidal thoughts 0 0 0 - 0  PHQ-9 Score 5 7 9  - 18  Difficult doing work/chores - Somewhat difficult - - Very difficult   GAD 7 : Generalized Anxiety Score 11/30/2018 11/16/2018 12/31/2017 09/09/2017  Nervous, Anxious, on Edge 1 1 3 3   Control/stop worrying 0 1 3 3   Worry too much - different things 0 1 2 3   Trouble relaxing 0 1 3 3   Restless 1 1 1 3   Easily annoyed or irritable 1 3 3 3   Afraid - awful might happen 0 0 1 3  Total GAD 7 Score 3 8 16 21   Anxiety Difficulty - - Very difficult Extremely difficult   '

## 2018-11-30 NOTE — Patient Instructions (Signed)

## 2018-11-30 NOTE — Progress Notes (Signed)
error 

## 2018-11-30 NOTE — Progress Notes (Signed)
Subjective:    Denise Gillespie is a G3P2002 [redacted]w[redacted]d being seen today for her first obstetrical visit.  Her obstetrical history is significant for obesity and prevoius cesarean section with VBAC. Patient does intend to breast feed. Pregnancy history fully reviewed.  Patient reports no complaints.  Vitals:   11/30/18 1344  BP: 119/79  Pulse: 82  Weight: 267 lb 11.2 oz (121.4 kg)    HISTORY: OB History  Gravida Para Term Preterm AB Living  3 2 2     2   SAB TAB Ectopic Multiple Live Births          2    # Outcome Date GA Lbr Len/2nd Weight Sex Delivery Anes PTL Lv  3 Current           2 Term 10/15/08 [redacted]w[redacted]d  7 lb 14 oz (3.572 kg)  VBAC EPI  LIV     Birth Comments: Hypertension;   1 Term 05/11/07 [redacted]w[redacted]d  7 lb 6 oz (3.345 kg) F CS-Unspec EPI       Birth Comments: c/s due to breech, preeclampsia   Past Medical History:  Diagnosis Date  . Abscess    multiple  . Anemia, iron deficiency   . Depression   . Hidradenitis   . Obesity   . Pregnancy induced hypertension    Past Surgical History:  Procedure Laterality Date  . CESAREAN SECTION    . INCISE AND DRAIN ABCESS     Family History  Problem Relation Age of Onset  . Hypertension Mother   . Migraines Mother   . Eczema Brother      Exam    Uterus:     Pelvic Exam:    Perineum: No Hemorrhoids   Vulva: normal   Vagina:  normal mucosa   pH:     Cervix: no lesions   Adnexa: normal adnexa   Bony Pelvis: average  System: Breast:  normal appearance, no masses or tenderness   Skin: normal coloration and turgor, no rashes    Neurologic: oriented, normal mood   Extremities: normal strength, tone, and muscle mass   HEENT PERRLA, sclera clear, anicteric, neck supple with midline trachea and thyroid without masses   Mouth/Teeth     Neck supple   Cardiovascular: regular rate and rhythm, no murmurs or gallops   Respiratory:  appears well, vitals normal, no respiratory distress, acyanotic, normal RR, neck free of mass or  lymphadenopathy, chest clear, no wheezing, crepitations, rhonchi, normal symmetric air entry   Abdomen: soft, non-tender; bowel sounds normal; no masses,  no organomegaly   Urinary: urethral meatus normal      Assessment:    Pregnancy: [redacted]w[redacted]d Patient Active Problem List   Diagnosis Date Noted  . Previous cesarean delivery affecting pregnancy 11/30/2018  . Supervision of high risk pregnancy, antepartum 11/16/2018  . History of pregnancy induced hypertension 11/16/2018  . Obesity in pregnancy   . Depression   . Hydradenitis 05/25/2017  . Tobacco use disorder 05/25/2017  . Current severe episode of major depressive disorder without psychotic features (HCC) 05/25/2017  . Generalized anxiety disorder 05/25/2017  . Sore throat 03/04/2010  . OBESITY, CLASS III 03/21/2009        Plan:     Initial labs drawn. Prenatal vitamins. Problem list reviewed and updated. Genetic Screening discussed Panorama and AFP.  Ultrasound discussed; fetal survey: requested.  Follow up in 4 weeks. 50% of 30 min visit spent on counseling and coordination of care.  Plans to have TOLAC  Emeterio Reeve 11/30/2018

## 2018-12-01 ENCOUNTER — Encounter: Payer: Medicaid Other | Admitting: Family Medicine

## 2018-12-01 ENCOUNTER — Ambulatory Visit: Payer: Medicaid Other | Admitting: Family Medicine

## 2018-12-01 ENCOUNTER — Telehealth: Payer: Self-pay | Admitting: Family Medicine

## 2018-12-01 DIAGNOSIS — Z3A01 Less than 8 weeks gestation of pregnancy: Secondary | ICD-10-CM

## 2018-12-01 LAB — CERVICOVAGINAL ANCILLARY ONLY
Bacterial Vaginitis (gardnerella): POSITIVE — AB
Candida Glabrata: NEGATIVE
Candida Vaginitis: NEGATIVE
Chlamydia: NEGATIVE
Comment: NEGATIVE
Comment: NEGATIVE
Comment: NEGATIVE
Comment: NEGATIVE
Comment: NEGATIVE
Comment: NORMAL
Neisseria Gonorrhea: NEGATIVE
Trichomonas: NEGATIVE

## 2018-12-01 MED ORDER — PRENATAL 27-0.8 MG PO TABS: 1.0000 | ORAL_TABLET | Freq: Every day | ORAL | 9 refills | Status: DC

## 2018-12-01 NOTE — Addendum Note (Signed)
Addended by: Annabell Howells on: 12/01/2018 04:58 PM   Modules accepted: Orders

## 2018-12-01 NOTE — Telephone Encounter (Signed)
Patient called in stating that she attempted to go to the pharmacy to pick up her prenatal vitamins and was not able to. Patient stated that the pharmacy stated they have not received the prescriptions. Please reach back out to the patient about this.

## 2018-12-01 NOTE — Telephone Encounter (Signed)
Called pt who states [facility] did not receive a rx for prenatal vitamins. Pt states she was told at visit with Roselie Awkward, MD yesterday that a new rx would be sent to this pharmacy as she has recently changed pharmacies.   Prenatal vitamin rx sent to [facility] and pt notified.

## 2018-12-02 LAB — OBSTETRIC PANEL, INCLUDING HIV
Antibody Screen: NEGATIVE
Basophils Absolute: 0 10*3/uL (ref 0.0–0.2)
Basos: 0 %
EOS (ABSOLUTE): 0.1 10*3/uL (ref 0.0–0.4)
Eos: 2 %
HIV Screen 4th Generation wRfx: NONREACTIVE
Hematocrit: 31.6 % — ABNORMAL LOW (ref 34.0–46.6)
Hemoglobin: 10.7 g/dL — ABNORMAL LOW (ref 11.1–15.9)
Hepatitis B Surface Ag: NEGATIVE
Immature Grans (Abs): 0.1 10*3/uL (ref 0.0–0.1)
Immature Granulocytes: 1 %
Lymphocytes Absolute: 2.5 10*3/uL (ref 0.7–3.1)
Lymphs: 26 %
MCH: 28.2 pg (ref 26.6–33.0)
MCHC: 33.9 g/dL (ref 31.5–35.7)
MCV: 83 fL (ref 79–97)
Monocytes Absolute: 0.4 10*3/uL (ref 0.1–0.9)
Monocytes: 4 %
Neutrophils Absolute: 6.4 10*3/uL (ref 1.4–7.0)
Neutrophils: 67 %
Platelets: 282 10*3/uL (ref 150–450)
RBC: 3.79 x10E6/uL (ref 3.77–5.28)
RDW: 14.3 % (ref 11.7–15.4)
RPR Ser Ql: NONREACTIVE
Rh Factor: POSITIVE
Rubella Antibodies, IGG: 1.47 index (ref >0.99)
WBC: 9.5 10*3/uL (ref 3.4–10.8)

## 2018-12-02 LAB — CYTOLOGY - PAP
Comment: NEGATIVE
High risk HPV: NEGATIVE

## 2018-12-02 LAB — AFP, SERUM, OPEN SPINA BIFIDA
AFP MoM: 1.19
AFP Value: 31 ng/mL
Gest. Age on Collection Date: 16 weeks
Maternal Age At EDD: 30 yr
OSBR Risk 1 IN: 10000
Test Results:: NEGATIVE
Weight: 267 [lb_av]

## 2018-12-02 LAB — HEMOGLOBIN A1C
Est. average glucose Bld gHb Est-mCnc: 117 mg/dL
Hgb A1c MFr Bld: 5.7 % — ABNORMAL HIGH (ref 4.8–5.6)

## 2018-12-02 LAB — URINE CULTURE, OB REFLEX

## 2018-12-02 LAB — CULTURE, OB URINE

## 2018-12-03 ENCOUNTER — Other Ambulatory Visit: Payer: Self-pay | Admitting: Obstetrics & Gynecology

## 2018-12-03 DIAGNOSIS — R87612 Low grade squamous intraepithelial lesion on cytologic smear of cervix (LGSIL): Secondary | ICD-10-CM

## 2018-12-03 MED ORDER — METRONIDAZOLE 500 MG PO TABS: 500.0000 mg | ORAL_TABLET | Freq: Two times a day (BID) | ORAL | 0 refills | Status: AC

## 2018-12-08 ENCOUNTER — Telehealth: Payer: Self-pay | Admitting: Obstetrics & Gynecology

## 2018-12-08 ENCOUNTER — Other Ambulatory Visit: Payer: Self-pay | Admitting: Obstetrics & Gynecology

## 2018-12-08 ENCOUNTER — Encounter: Payer: Self-pay | Admitting: General Practice

## 2018-12-08 DIAGNOSIS — O0990 Supervision of high risk pregnancy, unspecified, unspecified trimester: Secondary | ICD-10-CM

## 2018-12-08 MED ORDER — NITROFURANTOIN MONOHYD MACRO 100 MG PO CAPS: 100.0000 mg | ORAL_CAPSULE | Freq: Two times a day (BID) | ORAL | 1 refills | Status: DC

## 2018-12-08 NOTE — Telephone Encounter (Signed)
The patient stated she was unaware of the need for a colpo and no talked to her about it. She stated she would like a call to explain the procedure and why it is needed.   The patient was scheduled due to an in-basket message from Winnie.

## 2018-12-08 NOTE — Telephone Encounter (Signed)
I called Shaquitia back and apologized she got the call for the appointment before she got the results. I explained usually we call with results and then registrar calls with appointment.  I explained she had abnormal pap showing LSIL and recommendation is for Colposcopy  and that a biopsy is done to make sure she doesn't need any other treatment. I answered her questions. She voices understanding. Buna Cuppett,RN

## 2018-12-13 ENCOUNTER — Encounter: Payer: Self-pay | Admitting: *Deleted

## 2018-12-15 ENCOUNTER — Other Ambulatory Visit (HOSPITAL_COMMUNITY): Payer: Self-pay | Admitting: Obstetrics & Gynecology

## 2018-12-16 ENCOUNTER — Encounter: Payer: Self-pay | Admitting: Obstetrics and Gynecology

## 2018-12-20 ENCOUNTER — Encounter: Payer: Self-pay | Admitting: Obstetrics & Gynecology

## 2018-12-20 ENCOUNTER — Encounter: Payer: Self-pay | Admitting: Obstetrics and Gynecology

## 2018-12-21 ENCOUNTER — Other Ambulatory Visit (HOSPITAL_COMMUNITY): Payer: Self-pay | Admitting: *Deleted

## 2018-12-21 ENCOUNTER — Ambulatory Visit (HOSPITAL_COMMUNITY)
Admission: RE | Admit: 2018-12-21 | Discharge: 2018-12-21 | Disposition: A | Discharge status: Home / Self Care | Payer: Medicaid Other | Source: Ambulatory Visit | Attending: Obstetrics and Gynecology | Admitting: Obstetrics and Gynecology

## 2018-12-21 ENCOUNTER — Telehealth (INDEPENDENT_AMBULATORY_CARE_PROVIDER_SITE_OTHER): Payer: Medicaid Other | Admitting: Lactation Services

## 2018-12-21 ENCOUNTER — Other Ambulatory Visit: Payer: Self-pay

## 2018-12-21 DIAGNOSIS — O09292 Supervision of pregnancy with other poor reproductive or obstetric history, second trimester: Secondary | ICD-10-CM

## 2018-12-21 DIAGNOSIS — Z148 Genetic carrier of other disease: Secondary | ICD-10-CM

## 2018-12-21 DIAGNOSIS — Z3A19 19 weeks gestation of pregnancy: Secondary | ICD-10-CM | POA: Diagnosis not present

## 2018-12-21 DIAGNOSIS — O0990 Supervision of high risk pregnancy, unspecified, unspecified trimester: Secondary | ICD-10-CM | POA: Diagnosis not present

## 2018-12-21 DIAGNOSIS — O99342 Other mental disorders complicating pregnancy, second trimester: Secondary | ICD-10-CM | POA: Diagnosis not present

## 2018-12-21 DIAGNOSIS — O99212 Obesity complicating pregnancy, second trimester: Secondary | ICD-10-CM

## 2018-12-21 DIAGNOSIS — O99210 Obesity complicating pregnancy, unspecified trimester: Secondary | ICD-10-CM

## 2018-12-21 DIAGNOSIS — Z8759 Personal history of other complications of pregnancy, childbirth and the puerperium: Secondary | ICD-10-CM | POA: Insufficient documentation

## 2018-12-21 DIAGNOSIS — O34219 Maternal care for unspecified type scar from previous cesarean delivery: Secondary | ICD-10-CM

## 2018-12-21 DIAGNOSIS — O0992 Supervision of high risk pregnancy, unspecified, second trimester: Secondary | ICD-10-CM

## 2018-12-21 DIAGNOSIS — Z362 Encounter for other antenatal screening follow-up: Secondary | ICD-10-CM

## 2018-12-21 NOTE — Telephone Encounter (Signed)
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 Discussed this is a common blood disorder and does not mean there is anything wrong with the baby.   Recommended mom call Johnsie Cancel at [phone] to set up an appointment with Genetic Counseling to discuss further and to discuss if FOB needs to be tested also.   Pt voiced understanding and has not questions at this time.

## 2018-12-22 NOTE — BH Specialist Note (Signed)
Pt did not arrive to video visit and did not answer the phone ; Left HIPPA-compliant message to call back Roselyn Reef from [facility] at [phone].  ; left MyChart message for patient.    [name] via Telemedicine Video Visit  12/22/2018 Denise Gillespie [id number]  Garlan Fair

## 2018-12-27 ENCOUNTER — Encounter: Payer: Self-pay | Admitting: General Practice

## 2018-12-28 ENCOUNTER — Telehealth (INDEPENDENT_AMBULATORY_CARE_PROVIDER_SITE_OTHER): Payer: Medicaid Other | Admitting: Obstetrics and Gynecology

## 2018-12-28 ENCOUNTER — Encounter: Payer: Self-pay | Admitting: Obstetrics and Gynecology

## 2018-12-28 DIAGNOSIS — Z6841 Body Mass Index (BMI) 40.0 and over, adult: Secondary | ICD-10-CM | POA: Insufficient documentation

## 2018-12-28 DIAGNOSIS — Z3A20 20 weeks gestation of pregnancy: Secondary | ICD-10-CM | POA: Diagnosis not present

## 2018-12-28 DIAGNOSIS — O99212 Obesity complicating pregnancy, second trimester: Secondary | ICD-10-CM | POA: Diagnosis not present

## 2018-12-28 DIAGNOSIS — Z98891 History of uterine scar from previous surgery: Secondary | ICD-10-CM

## 2018-12-28 DIAGNOSIS — O0992 Supervision of high risk pregnancy, unspecified, second trimester: Secondary | ICD-10-CM

## 2018-12-28 DIAGNOSIS — R87612 Low grade squamous intraepithelial lesion on cytologic smear of cervix (LGSIL): Secondary | ICD-10-CM | POA: Diagnosis not present

## 2018-12-28 DIAGNOSIS — O99112 Other diseases of the blood and blood-forming organs and certain disorders involving the immune mechanism complicating pregnancy, second trimester: Secondary | ICD-10-CM | POA: Diagnosis not present

## 2018-12-28 DIAGNOSIS — D563 Thalassemia minor: Secondary | ICD-10-CM

## 2018-12-28 DIAGNOSIS — O0990 Supervision of high risk pregnancy, unspecified, unspecified trimester: Secondary | ICD-10-CM

## 2018-12-28 MED ORDER — BLOOD PRESSURE KIT DEVI: 1.0000 | Freq: Once | 0 refills | Status: AC

## 2018-12-28 NOTE — Progress Notes (Signed)
I connected with  Denise Gillespie on 12/28/18 at 11:15 AM EST by telephone and verified that I am speaking with the correct person using two identifiers.   I discussed the limitations, risks, security and privacy concerns of performing an evaluation and management service by telephone and the availability of in person appointments. I also discussed with the patient that there may be a patient responsible charge related to this service. The patient expressed understanding and agreed to proceed.  Derinda Late, RN 12/28/2018  10:54 AM

## 2018-12-29 NOTE — Progress Notes (Signed)
   TELEHEALTH VIRTUAL OBSTETRICS VISIT ENCOUNTER NOTE  Clinic: [facility]  I connected with Denise Gillespie on 12/28/18 at 11:15 AM EST by telephone at home and verified that I am speaking with the correct person using two identifiers.   I discussed the limitations, risks, security and privacy concerns of performing an evaluation and management service by telephone and the availability of in person appointments. I also discussed with the patient that there may be a patient responsible charge related to this service. The patient expressed understanding and agreed to proceed.  Subjective:  Denise Gillespie is a 28 y.o. G3P2002 at 69w0dbeing followed for ongoing prenatal care.  She is currently monitored for the following issues for this high-risk pregnancy and has OBESITY, CLASS III; Hydradenitis; Tobacco use disorder; Current severe episode of major depressive disorder without psychotic features ([place]; Generalized anxiety disorder; Supervision of high risk pregnancy, antepartum; History of severe pre-eclampsia; Obesity in pregnancy; Depression; History of VBAC; LGSIL on Pap smear of cervix; Alpha thalassemia trait; and BMI 45.0-49.9, adult ([facility] on their problem list.  Patient reports no complaints. Reports fetal movement. Denies any contractions, bleeding or leaking of fluid.   The following portions of the patient's history were reviewed and updated as appropriate: allergies, current medications, past family history, past medical history, past social history, past surgical history and problem list.   Objective:  There were no vitals filed for this visit.  Babyscripts Data Reviewed: not applicable  General:  Alert, oriented and cooperative.   Mental Status: Normal mood and affect perceived. Normal judgment and thought content.  Rest of physical exam deferred due to type of encounter  Assessment and Plan:  Pregnancy: G3P2002 at 210w0d. Supervision of high  risk pregnancy, antepartum Anatomy scheduled for 1/5 - Blood Pressure Monitoring (BLOOD PRESSURE KIT) DEVI; 1 Device by Does not apply route once for 1 dose. High risk icd 10 z34.90; large cuff  Dispense: 1 each; Refill: 0  2. OBESITY, CLASS III  3. BMI 45.0-49.9, adult ([place] Park[place] to follow  4. Alpha thalassemia trait  5. LGSIL on Pap smear of cervix Pt set up for colpo nv  6. History of VBAC D/w her more next visits  7. Psych Sees BH later this week  Preterm labor symptoms and general obstetric precautions including but not limited to vaginal bleeding, contractions, leaking of fluid and fetal movement were reviewed in detail with the patient.  I discussed the assessment and treatment plan with the patient. The patient was provided an opportunity to ask questions and all were answered. The patient agreed with the plan and demonstrated an understanding of the instructions. The patient was advised to call back or seek an in-person office evaluation/[facility] or concerning symptoms. Please refer to After Visit Summary for other counseling recommendations.   I provided 7 minutes of non-face-to-face time during this encounter. The visit was conducted via MyChart-medicine  Return in about 3 weeks (around 01/18/2019).  Future Appointments  Date Time Provider DeAlgona12/18/2020  8:15 AM WOVirgilOFourche1/04/2019 10:55 AM ArWoodroe ModeMD WOC-WOCA WOC  01/18/2019  9:15 AM WH-MFC US[place] WH-MFCUS MFC-US  01/18/2019  9:20 AM WH-MFC NURSE WH-MFC MFC-US    ChAletha HalimMD [facility]

## 2018-12-31 ENCOUNTER — Ambulatory Visit: Payer: Medicaid Other | Admitting: Clinical

## 2018-12-31 ENCOUNTER — Other Ambulatory Visit: Payer: Self-pay

## 2018-12-31 DIAGNOSIS — Z91199 Patient's noncompliance with other medical treatment and regimen due to unspecified reason: Secondary | ICD-10-CM

## 2018-12-31 DIAGNOSIS — Z5329 Procedure and treatment not carried out because of patient's decision for other reasons: Secondary | ICD-10-CM

## 2019-01-14 NOTE — L&D Delivery Note (Addendum)
Patient is 29 y.o. T4H9622 [redacted]w[redacted]d admitted for IOL for severe pre E and A2GDM. S/p IOL with foley bulb, followed by Pitocin. AROM at 1630.  Prenatal course also complicated by severe pre eclampsia, gestational diabetes, TOLAC.  Delivery Note At 10:25 PM a viable female was delivered via VBAC, Spontaneous (Presentation: Right Occiput Anterior).  APGAR: 8, 9; weight  .   Placenta status: Spontaneous, Intact.  Cord: 3 vessels with the following complications: avulsion and manual removal  Head delivered ROA. Tight nuchal cord present. Unable to reduce, so baby was delivered with summersault maneuver and cord reduced immediately after delivery. Infant with spontaneous cry, placed on mother's abdomen, dried and bulb suctioned. Cord clamped x 2 after 1-minute delay, and cut by family member. Cord blood drawn. Placenta not detaching after 20 minutes of traction.  Cord eventually avulsed and manual removal of intact placenta was performed without difficulty.   Fundus firm with massage and Pitocin. Perineum inspected and found to have no laceration.   Anesthesia: Epidural Episiotomy: None Lacerations: None Suture Repair: na Est. Blood Loss (mL): 300  Mom to postpartum.  Baby likely couplet care after nursery provider evaluates respiratory status. Sandre Kitty 04/22/2019, 10:58 PM  OB FELLOW DELIVERY ATTESTATION  I was gloved and present for the delivery in its entirety, and I agree with the above resident's note.    Jerilynn Birkenhead, MD [facility] Family Medicine Fellow, [facility] for [employer], [facility]

## 2019-01-17 ENCOUNTER — Ambulatory Visit: Payer: Medicaid Other | Admitting: Obstetrics & Gynecology

## 2019-01-18 ENCOUNTER — Encounter (HOSPITAL_COMMUNITY): Payer: Self-pay

## 2019-01-18 ENCOUNTER — Ambulatory Visit (HOSPITAL_COMMUNITY)
Admission: RE | Admit: 2019-01-18 | Discharge: 2019-01-18 | Disposition: A | Discharge status: Home / Self Care | Payer: Medicaid Other | Source: Ambulatory Visit | Attending: Obstetrics and Gynecology | Admitting: Obstetrics and Gynecology

## 2019-01-18 ENCOUNTER — Other Ambulatory Visit: Payer: Self-pay

## 2019-01-18 ENCOUNTER — Other Ambulatory Visit (HOSPITAL_COMMUNITY): Payer: Self-pay | Admitting: *Deleted

## 2019-01-18 ENCOUNTER — Ambulatory Visit (HOSPITAL_COMMUNITY): Payer: Medicaid Other | Admitting: *Deleted

## 2019-01-18 DIAGNOSIS — Z8759 Personal history of other complications of pregnancy, childbirth and the puerperium: Secondary | ICD-10-CM

## 2019-01-18 DIAGNOSIS — O99213 Obesity complicating pregnancy, third trimester: Secondary | ICD-10-CM

## 2019-01-18 DIAGNOSIS — O99342 Other mental disorders complicating pregnancy, second trimester: Secondary | ICD-10-CM

## 2019-01-18 DIAGNOSIS — O0990 Supervision of high risk pregnancy, unspecified, unspecified trimester: Secondary | ICD-10-CM

## 2019-01-18 DIAGNOSIS — Z3A23 23 weeks gestation of pregnancy: Secondary | ICD-10-CM | POA: Diagnosis not present

## 2019-01-18 DIAGNOSIS — O09292 Supervision of pregnancy with other poor reproductive or obstetric history, second trimester: Secondary | ICD-10-CM | POA: Diagnosis not present

## 2019-01-18 DIAGNOSIS — O34219 Maternal care for unspecified type scar from previous cesarean delivery: Secondary | ICD-10-CM

## 2019-01-18 DIAGNOSIS — O99210 Obesity complicating pregnancy, unspecified trimester: Secondary | ICD-10-CM | POA: Diagnosis not present

## 2019-01-18 DIAGNOSIS — Z362 Encounter for other antenatal screening follow-up: Secondary | ICD-10-CM | POA: Insufficient documentation

## 2019-01-18 DIAGNOSIS — O99212 Obesity complicating pregnancy, second trimester: Secondary | ICD-10-CM

## 2019-02-16 ENCOUNTER — Encounter (HOSPITAL_COMMUNITY): Payer: Self-pay | Admitting: Obstetrics and Gynecology

## 2019-02-16 ENCOUNTER — Inpatient Hospital Stay (HOSPITAL_COMMUNITY)
Admission: AD | Admit: 2019-02-16 | Discharge: 2019-02-16 | Disposition: A | Discharge status: Home / Self Care | Payer: Medicaid Other | Attending: Obstetrics and Gynecology | Admitting: Obstetrics and Gynecology

## 2019-02-16 ENCOUNTER — Other Ambulatory Visit: Payer: Self-pay

## 2019-02-16 ENCOUNTER — Inpatient Hospital Stay (HOSPITAL_COMMUNITY): Payer: Medicaid Other

## 2019-02-16 DIAGNOSIS — O99612 Diseases of the digestive system complicating pregnancy, second trimester: Secondary | ICD-10-CM | POA: Diagnosis not present

## 2019-02-16 DIAGNOSIS — K219 Gastro-esophageal reflux disease without esophagitis: Secondary | ICD-10-CM | POA: Diagnosis not present

## 2019-02-16 DIAGNOSIS — O0990 Supervision of high risk pregnancy, unspecified, unspecified trimester: Secondary | ICD-10-CM

## 2019-02-16 DIAGNOSIS — Z87891 Personal history of nicotine dependence: Secondary | ICD-10-CM | POA: Insufficient documentation

## 2019-02-16 DIAGNOSIS — Z8759 Personal history of other complications of pregnancy, childbirth and the puerperium: Secondary | ICD-10-CM

## 2019-02-16 DIAGNOSIS — R1011 Right upper quadrant pain: Secondary | ICD-10-CM

## 2019-02-16 DIAGNOSIS — Z3A27 27 weeks gestation of pregnancy: Secondary | ICD-10-CM | POA: Diagnosis not present

## 2019-02-16 DIAGNOSIS — R109 Unspecified abdominal pain: Secondary | ICD-10-CM | POA: Diagnosis present

## 2019-02-16 DIAGNOSIS — O0992 Supervision of high risk pregnancy, unspecified, second trimester: Secondary | ICD-10-CM

## 2019-02-16 DIAGNOSIS — O99210 Obesity complicating pregnancy, unspecified trimester: Secondary | ICD-10-CM

## 2019-02-16 LAB — URINALYSIS, ROUTINE W REFLEX MICROSCOPIC
Bacteria, UA: NONE SEEN
Bilirubin Urine: NEGATIVE
Glucose, UA: NEGATIVE mg/dL
Hgb urine dipstick: NEGATIVE
Ketones, ur: NEGATIVE mg/dL
Leukocytes,Ua: NEGATIVE
Nitrite: NEGATIVE
Protein, ur: 30 mg/dL — AB
Specific Gravity, Urine: 1.03 (ref 1.005–1.030)
pH: 6 (ref 5.0–8.0)

## 2019-02-16 LAB — COMPREHENSIVE METABOLIC PANEL
ALT: 19 U/L (ref 0–44)
AST: 22 U/L (ref 15–41)
Albumin: 2.8 g/dL — ABNORMAL LOW (ref 3.5–5.0)
Alkaline Phosphatase: 40 U/L (ref 38–126)
Anion gap: 9 (ref 5–15)
BUN: 5 mg/dL — ABNORMAL LOW (ref 6–20)
CO2: 23 mmol/L (ref 22–32)
Calcium: 8.5 mg/dL — ABNORMAL LOW (ref 8.9–10.3)
Chloride: 103 mmol/L (ref 98–111)
Creatinine, Ser: 0.48 mg/dL (ref 0.44–1.00)
GFR calc Af Amer: >60 mL/min (ref >60)
GFR calc non Af Amer: >60 mL/min (ref >60)
Glucose, Bld: 78 mg/dL (ref 70–99)
Potassium: 3.5 mmol/L (ref 3.5–5.1)
Sodium: 135 mmol/L (ref 135–145)
Total Bilirubin: 0.6 mg/dL (ref 0.3–1.2)
Total Protein: 6 g/dL — ABNORMAL LOW (ref 6.5–8.1)

## 2019-02-16 LAB — CBC WITH DIFFERENTIAL/PLATELET
Abs Immature Granulocytes: 0.05 10*3/uL (ref 0.00–0.07)
Basophils Absolute: 0 10*3/uL (ref 0.0–0.1)
Basophils Relative: 0 %
Eosinophils Absolute: 0.1 10*3/uL (ref 0.0–0.5)
Eosinophils Relative: 1 %
HCT: 31.1 % — ABNORMAL LOW (ref 36.0–46.0)
Hemoglobin: 9.9 g/dL — ABNORMAL LOW (ref 12.0–15.0)
Immature Granulocytes: 1 %
Lymphocytes Relative: 26 %
Lymphs Abs: 2.1 10*3/uL (ref 0.7–4.0)
MCH: 27.7 pg (ref 26.0–34.0)
MCHC: 31.8 g/dL (ref 30.0–36.0)
MCV: 87.1 fL (ref 80.0–100.0)
Monocytes Absolute: 0.4 10*3/uL (ref 0.1–1.0)
Monocytes Relative: 5 %
Neutro Abs: 5.5 10*3/uL (ref 1.7–7.7)
Neutrophils Relative %: 67 %
Platelets: 267 10*3/uL (ref 150–400)
RBC: 3.57 MIL/uL — ABNORMAL LOW (ref 3.87–5.11)
RDW: 13.3 % (ref 11.5–15.5)
WBC: 8.2 10*3/uL (ref 4.0–10.5)
nRBC: 0 % (ref 0.0–0.2)

## 2019-02-16 LAB — LIPASE, BLOOD: Lipase: 21 U/L (ref 11–51)

## 2019-02-16 MED ORDER — LIDOCAINE VISCOUS HCL 2 % MT SOLN
15.0000 mL | Freq: Once | OROMUCOSAL | Status: AC
  Administered 2019-02-16: 19:00:00 15 mL via ORAL
  Filled 2019-02-16: qty 15

## 2019-02-16 MED ORDER — ALUM & MAG HYDROXIDE-SIMETH 200-200-20 MG/5ML PO SUSP
30.0000 mL | Freq: Once | ORAL | Status: AC
  Administered 2019-02-16: 30 mL via ORAL
  Filled 2019-02-16: qty 30

## 2019-02-16 MED ORDER — PANTOPRAZOLE SODIUM 20 MG PO TBEC: 20.0000 mg | DELAYED_RELEASE_TABLET | Freq: Every day | ORAL | 1 refills | Status: DC

## 2019-02-16 NOTE — Discharge Instructions (Signed)
Abdominal Pain During Pregnancy  Abdominal pain is common during pregnancy, and has many possible causes. Some causes are more serious than others, and sometimes the cause is not known. Abdominal pain can be a sign that labor is starting. It can also be caused by normal growth and stretching of muscles and ligaments during pregnancy. Always tell your health care provider if you have any abdominal pain. Follow these instructions at home:  Do not have sex or put anything in your vagina until your pain goes away completely.  Get plenty of rest until your pain improves.  Drink enough fluid to keep your urine pale yellow.  Take over-the-counter and prescription medicines only as told by your health care provider.  Keep all follow-up visits as told by your health care provider. This is important. Contact a health care provider if:  Your pain continues or gets worse after resting.  You have lower abdominal pain that: ? Comes and goes at regular intervals. ? Spreads to your back. ? Is similar to menstrual cramps.  You have pain or burning when you urinate. Get help right away if:  You have a fever or chills.  You have vaginal bleeding.  You are leaking fluid from your vagina.  You are passing tissue from your vagina.  You have vomiting or diarrhea that lasts for more than 24 hours.  Your baby is moving less than usual.  You feel very weak or faint.  You have shortness of breath.  You develop severe pain in your upper abdomen. Summary  Abdominal pain is common during pregnancy, and has many possible causes.  If you experience abdominal pain during pregnancy, tell your health care provider right away.  Follow your health care provider's home care instructions and keep all follow-up visits as directed. This information is not intended to replace advice given to you by your health care provider. Make sure you discuss any questions you have with your health care  provider. Document Revised: 04/19/2018 Document Reviewed: 04/03/2016 Elsevier Patient Education  2020 Elsevier Inc.  

## 2019-02-16 NOTE — MAU Note (Signed)
Patient signed paper AVS.  

## 2019-02-16 NOTE — MAU Provider Note (Addendum)
History     CSN: [id number]  Arrival date and time: 02/16/19 1645   None     Chief Complaint  Patient presents with  . Abdominal Pain   HPI   Denise Gillespie is a 29 y.o. female G11P2002 @ [redacted]w[redacted]d here with upper abdominal pain. The pain is worse in the center and toward the RUQ. . The pain does not radiate anywhere. This pain has been present for 2 weeks. The pain is constant.  Hast tried warm compress and tylenol which does not help.  Caffeine intake; she drinks cheer wine and Star bucks sometimes 2x per day. Says she drinks 4, 20 ounce cheer wines in a day.  No history of GERD.  Recently she is craving spicy foods since she became pregnant.   OB History    Gravida  3   Para  2   Term  2   Preterm      AB      Living  2     SAB      TAB      Ectopic      Multiple      Live Births  2           Past Medical History:  Diagnosis Date  . Abscess    multiple  . Anemia, iron deficiency   . Depression   . Hidradenitis   . Obesity   . Pregnancy induced hypertension     Past Surgical History:  Procedure Laterality Date  . CESAREAN SECTION    . INCISE AND DRAIN ABCESS      Family History  Problem Relation Age of Onset  . Hypertension Mother   . Migraines Mother   . Eczema Brother     Social History   Tobacco Use  . Smoking status: Former Smoker    Packs/day: 0.25    Types: Cigarettes    Quit date: 09/18/2018    Years since quitting: 0.4  . Smokeless tobacco: Never Used  . Tobacco comment: quit when found out pregnant   Substance Use Topics  . Alcohol use: Not Currently    Comment: Social  . Drug use: No    Allergies: No Known Allergies  Medications Prior to Admission  Medication Sig Dispense Refill Last Dose  . Prenatal Vit-Fe Fumarate-FA (MULTIVITAMIN-PRENATAL) 27-0.8 MG TABS tablet Take 1 tablet by mouth daily at 12 noon. 30 tablet 9 02/16/2019 at Unknown time  . nitrofurantoin, macrocrystal-monohydrate, (MACROBID) 100 MG capsule  Take 100 mg by mouth 2 (two) times daily.      Results for orders placed or performed during the hospital encounter of 02/16/19 (from the past 48 hour(s))  Urinalysis, Routine w reflex microscopic     Status: Abnormal   Collection Time: 02/16/19  5:30 PM  Result Value Ref Range   Color, Urine AMBER (A) YELLOW    Comment: BIOCHEMICALS MAY BE AFFECTED BY COLOR   APPearance HAZY (A) CLEAR   Specific Gravity, Urine 1.030 1.005 - 1.030   pH 6.0 5.0 - 8.0   Glucose, UA NEGATIVE NEGATIVE mg/dL   Hgb urine dipstick NEGATIVE NEGATIVE   Bilirubin Urine NEGATIVE NEGATIVE   Ketones, ur NEGATIVE NEGATIVE mg/dL   Protein, ur 30 (A) NEGATIVE mg/dL   Nitrite NEGATIVE NEGATIVE   Leukocytes,Ua NEGATIVE NEGATIVE   RBC / HPF 0-5 0 - 5 RBC/hpf   WBC, UA 0-5 0 - 5 WBC/hpf   Bacteria, UA NONE SEEN NONE SEEN   Squamous Epithelial /  LPF 0-5 0 - 5   Mucus PRESENT     Comment: Performed at [facility], [place] [address] [place], [place] [address]  CBC with Differential/Platelet     Status: Abnormal   Collection Time: 02/16/19  6:34 PM  Result Value Ref Range   WBC 8.2 4.0 - 10.5 K/uL   RBC 3.57 (L) 3.87 - 5.11 MIL/uL   Hemoglobin 9.9 (L) 12.0 - 15.0 g/dL   HCT 31.1 (L) 36.0 - 46.0 %   MCV 87.1 80.0 - 100.0 fL   MCH 27.7 26.0 - 34.0 pg   MCHC 31.8 30.0 - 36.0 g/dL   RDW 13.3 11.5 - 15.5 %   Platelets 267 150 - 400 K/uL   nRBC 0.0 0.0 - 0.2 %   Neutrophils Relative % 67 %   Neutro Abs 5.5 1.7 - 7.7 K/uL   Lymphocytes Relative 26 %   Lymphs Abs 2.1 0.7 - 4.0 K/uL   Monocytes Relative 5 %   Monocytes Absolute 0.4 0.1 - 1.0 K/uL   Eosinophils Relative 1 %   Eosinophils Absolute 0.1 0.0 - 0.5 K/uL   Basophils Relative 0 %   Basophils Absolute 0.0 0.0 - 0.1 K/uL   Immature Granulocytes 1 %   Abs Immature Granulocytes 0.05 0.00 - 0.07 K/uL    Comment: Performed at [facility], [address], [place], [place] [address]   US Abdomen Limited RUQ  Result Date: 02/16/2019 CLINICAL DATA:   Right upper quadrant pain EXAM: ULTRASOUND ABDOMEN LIMITED RIGHT UPPER QUADRANT COMPARISON:  None. FINDINGS: Gallbladder: No gallstones or wall thickening visualized. No sonographic Murphy sign noted by sonographer. Common bile duct: Diameter: 3 mm Liver: No focal lesion identified. Within normal limits in parenchymal echogenicity. Portal vein is patent on color Doppler imaging with normal direction of blood flow towards the liver. Other: None. IMPRESSION: Normal ultrasound of the right upper quadrant. Electronically Signed   By: Macy Mis M.D.   On: 02/16/2019 19:57   Review of Systems  Constitutional: Negative for fever.  Gastrointestinal: Positive for abdominal pain. Negative for constipation, nausea and vomiting.  Genitourinary: Negative for vaginal bleeding.   Physical Exam   Blood pressure 118/63, pulse 92, temperature 99.1 F (37.3 C), temperature source Oral, resp. rate 20, height 5' (1.524 m), weight 122.5 kg, last menstrual period 08/10/2018, SpO2 100 %.  Physical Exam  Constitutional: She is oriented to person, place, and time. She appears well-developed and well-nourished. No distress.  GI: Soft. Normal appearance. There is abdominal tenderness in the right upper quadrant and epigastric area. There is negative Murphy's sign.  Musculoskeletal:        General: Normal range of motion.  Neurological: She is alert and oriented to person, place, and time.  Skin: Skin is warm. She is not diaphoretic.  Psychiatric: Her behavior is normal.   Fetal Tracing: Baseline: 135 bpm Variability: Moderate  Accelerations: 15x15 Decelerations: None Toco: None  MAU Course  Procedures  None  MDM  GI cocktail given Lipase, CBC, CMP RUQ [place]: Normal Report given to Hansel Feinstein CNM who resumes care of the patient.  Wait for lab results  Rasch, Artist Pais, NP 02/16/2019 8:08 PM  Assumed care Lab results are back now RUQ [place] is normal   Results for orders placed or performed during  the hospital encounter of 02/16/19 (from the past 24 hour(s))  Urinalysis, Routine w reflex microscopic     Status: Abnormal   Collection Time: 02/16/19  5:30 PM  Result Value Ref Range   Color, Urine AMBER (A) YELLOW   APPearance HAZY (A) CLEAR   Specific Gravity, Urine 1.030 1.005 - 1.030   pH 6.0 5.0 - 8.0   Glucose, UA NEGATIVE NEGATIVE mg/dL   Hgb urine dipstick NEGATIVE NEGATIVE   Bilirubin Urine NEGATIVE NEGATIVE   Ketones, ur NEGATIVE NEGATIVE mg/dL   Protein, ur 30 (A) NEGATIVE mg/dL   Nitrite NEGATIVE NEGATIVE   Leukocytes,Ua NEGATIVE NEGATIVE   RBC / HPF 0-5 0 - 5 RBC/hpf   WBC, UA 0-5 0 - 5 WBC/hpf   Bacteria, UA NONE SEEN NONE SEEN   Squamous Epithelial / LPF 0-5 0 - 5   Mucus PRESENT   CBC with Differential/Platelet     Status: Abnormal   Collection Time: 02/16/19  6:34 PM  Result Value Ref Range   WBC 8.2 4.0 - 10.5 K/uL   RBC 3.57 (L) 3.87 - 5.11 MIL/uL   Hemoglobin 9.9 (L) 12.0 - 15.0 g/dL   HCT 16.1 (L) 09.6 - 04.5 %   MCV 87.1 80.0 - 100.0 fL   MCH 27.7 26.0 - 34.0 pg   MCHC 31.8 30.0 - 36.0 g/dL   RDW 40.9 81.1 - 91.4 %   Platelets 267 150 - 400 K/uL   nRBC 0.0 0.0 - 0.2 %   Neutrophils Relative % 67 %   Neutro Abs 5.5 1.7 - 7.7 K/uL   Lymphocytes Relative 26 %   Lymphs Abs 2.1 0.7 - 4.0 K/uL   Monocytes Relative 5 %   Monocytes Absolute 0.4 0.1 - 1.0 K/uL   Eosinophils Relative 1 %   Eosinophils Absolute 0.1 0.0 - 0.5 K/uL   Basophils Relative 0 %   Basophils Absolute 0.0 0.0 - 0.1 K/uL   Immature Granulocytes 1 %   Abs Immature Granulocytes 0.05 0.00 - 0.07 K/uL  Comprehensive metabolic panel     Status: Abnormal   Collection Time: 02/16/19  6:34 PM  Result Value Ref Range   Sodium 135 135 - 145 mmol/L   Potassium 3.5 3.5 - 5.1 mmol/L   Chloride 103 98 - 111 mmol/L   CO2 23 22 - 32 mmol/L   Glucose, Bld 78 70 - 99 mg/dL   BUN 5 (L) 6 - 20 mg/dL   Creatinine, Ser 7.82 0.44 - 1.00 mg/dL   Calcium 8.5 (L) 8.9 - 10.3 mg/dL   Total Protein 6.0  (L) 6.5 - 8.1 g/dL   Albumin 2.8 (L) 3.5 - 5.0 g/dL   AST 22 15 - 41 U/L   ALT 19 0 - 44 U/L   Alkaline Phosphatase 40 38 - 126 U/L   Total Bilirubin 0.6 0.3 - 1.2 mg/dL   GFR calc non Af Amer >60 >60 mL/min   GFR calc Af Amer >60 >60 mL/min   Anion gap 9 5 - 15  Lipase, blood     Status: None   Collection Time: 02/16/19  6:34 PM  Result Value Ref Range   Lipase 21 11 - 51 U/L   Reviewed normal lab results Discussed acid reflux during pregnancy Recommend increase water and fiber intake  Assessment and Plan  SIngle IUP at [redacted]w[redacted]d Upper abdominal pain Normal labs, likely GERD  Discharge home Rx Protonix for acid reflux  Increase intake of water and fiber PTL precautions Encouraged to return here or to other Urgent Care/ED if she develops worsening of symptoms, increase in pain, fever, or other concerning symptoms.    Mayford Knife,  Elby Showers, CNM

## 2019-02-16 NOTE — MAU Note (Signed)
Presents with c/o RUQ and lower abdominal pain that began approximately 2 weeks ago.  States RUQ pain is constant, lower abdominal pain is intermittent.  Reports unrelieved with Tylenol.  Denies VB or LOF.  Endorses +FM.

## 2019-02-18 ENCOUNTER — Ambulatory Visit: Payer: Medicaid Other | Admitting: Obstetrics & Gynecology

## 2019-02-18 ENCOUNTER — Telehealth: Payer: Self-pay | Admitting: Obstetrics and Gynecology

## 2019-02-18 NOTE — Telephone Encounter (Signed)
Received a call from the patient stating she was at work and would not be able to come to her appointment. I offered her next Friday on the 12th, and she stated she already has an appointment on the day. I informed her it was her ultrasound appointment, and I needed to get her OB appointment scheduled. She said she was going to reschedule her [place] appointment, then call us back to schedule her OB appointment. I transferred her to MFM, and canceled her appointment with [place] for today.

## 2019-02-21 ENCOUNTER — Telehealth: Payer: Self-pay | Admitting: General Practice

## 2019-02-21 ENCOUNTER — Ambulatory Visit (HOSPITAL_COMMUNITY): Payer: Medicaid Other

## 2019-02-21 NOTE — Telephone Encounter (Signed)
Patient called into front office reporting continued RUQ/epigastric pain. Patient was recently seen in MAU on 2/3 and given Rx for protonix. Patient states she is taking the medication daily but it isn't helping. She also reports worsening pain at night time. Discussed with Dr Debroah Loop who states patient needs office visit to be seen/evaluated. Discussed with patient and recommended she continue protonix and avoid acid reflux aggravating foods like fried, greasy, or spicy foods. Discussed someone will call her with an appt. Patient verbalized understanding & had no questions.

## 2019-02-22 ENCOUNTER — Ambulatory Visit (HOSPITAL_COMMUNITY): Admission: RE | Admit: 2019-02-22 | Payer: Medicaid Other | Source: Ambulatory Visit

## 2019-02-22 ENCOUNTER — Ambulatory Visit (HOSPITAL_COMMUNITY): Payer: Medicaid Other

## 2019-02-24 ENCOUNTER — Ambulatory Visit (HOSPITAL_COMMUNITY): Payer: Medicaid Other | Admitting: *Deleted

## 2019-02-24 ENCOUNTER — Encounter (HOSPITAL_COMMUNITY): Payer: Self-pay

## 2019-02-24 ENCOUNTER — Other Ambulatory Visit: Payer: Self-pay

## 2019-02-24 ENCOUNTER — Ambulatory Visit (HOSPITAL_COMMUNITY)
Admission: RE | Admit: 2019-02-24 | Discharge: 2019-02-24 | Disposition: A | Discharge status: Home / Self Care | Payer: Medicaid Other | Source: Ambulatory Visit | Attending: Obstetrics | Admitting: Obstetrics

## 2019-02-24 ENCOUNTER — Other Ambulatory Visit (HOSPITAL_COMMUNITY): Payer: Self-pay | Admitting: *Deleted

## 2019-02-24 DIAGNOSIS — O09293 Supervision of pregnancy with other poor reproductive or obstetric history, third trimester: Secondary | ICD-10-CM | POA: Diagnosis not present

## 2019-02-24 DIAGNOSIS — O99213 Obesity complicating pregnancy, third trimester: Secondary | ICD-10-CM

## 2019-02-24 DIAGNOSIS — Z3A28 28 weeks gestation of pregnancy: Secondary | ICD-10-CM | POA: Diagnosis not present

## 2019-02-24 DIAGNOSIS — O0990 Supervision of high risk pregnancy, unspecified, unspecified trimester: Secondary | ICD-10-CM

## 2019-02-24 DIAGNOSIS — O99210 Obesity complicating pregnancy, unspecified trimester: Secondary | ICD-10-CM

## 2019-02-24 DIAGNOSIS — O99343 Other mental disorders complicating pregnancy, third trimester: Secondary | ICD-10-CM

## 2019-02-24 DIAGNOSIS — Z362 Encounter for other antenatal screening follow-up: Secondary | ICD-10-CM | POA: Diagnosis not present

## 2019-02-24 DIAGNOSIS — O34219 Maternal care for unspecified type scar from previous cesarean delivery: Secondary | ICD-10-CM | POA: Diagnosis not present

## 2019-02-24 DIAGNOSIS — Z8759 Personal history of other complications of pregnancy, childbirth and the puerperium: Secondary | ICD-10-CM | POA: Insufficient documentation

## 2019-02-25 ENCOUNTER — Ambulatory Visit (HOSPITAL_COMMUNITY): Payer: Medicaid Other

## 2019-02-28 ENCOUNTER — Encounter: Payer: Medicaid Other | Admitting: Family Medicine

## 2019-03-02 ENCOUNTER — Encounter: Payer: Self-pay | Admitting: Obstetrics and Gynecology

## 2019-03-02 ENCOUNTER — Other Ambulatory Visit: Payer: Self-pay | Admitting: *Deleted

## 2019-03-02 ENCOUNTER — Other Ambulatory Visit: Payer: Medicaid Other

## 2019-03-02 ENCOUNTER — Encounter: Payer: Medicaid Other | Admitting: Obstetrics & Gynecology

## 2019-03-02 DIAGNOSIS — O0990 Supervision of high risk pregnancy, unspecified, unspecified trimester: Secondary | ICD-10-CM

## 2019-03-02 NOTE — Progress Notes (Deleted)
   Patient did not show up today for her scheduled appointment.   Roy Tokarz, MD, FACOG Obstetrician & Gynecologist, [facility], [facility]  

## 2019-03-15 ENCOUNTER — Telehealth: Payer: Self-pay | Admitting: Obstetrics & Gynecology

## 2019-03-15 NOTE — Telephone Encounter (Signed)
Attempted to contact patient to get her scheduled for an ob appointment since she has missed her last couple of appointments. No answer, left voicemail for patient to give the office a call back to be scheduled.

## 2019-03-23 ENCOUNTER — Other Ambulatory Visit: Payer: Self-pay

## 2019-03-23 ENCOUNTER — Telehealth: Payer: Self-pay | Admitting: *Deleted

## 2019-03-23 ENCOUNTER — Inpatient Hospital Stay (HOSPITAL_COMMUNITY)
Admission: AD | Admit: 2019-03-23 | Discharge: 2019-03-23 | Disposition: A | Discharge status: Home / Self Care | Payer: Medicaid Other | Attending: Obstetrics and Gynecology | Admitting: Obstetrics and Gynecology

## 2019-03-23 ENCOUNTER — Encounter (HOSPITAL_COMMUNITY): Payer: Self-pay | Admitting: Obstetrics and Gynecology

## 2019-03-23 DIAGNOSIS — R102 Pelvic and perineal pain: Secondary | ICD-10-CM | POA: Diagnosis not present

## 2019-03-23 DIAGNOSIS — O26893 Other specified pregnancy related conditions, third trimester: Secondary | ICD-10-CM

## 2019-03-23 DIAGNOSIS — Z3A32 32 weeks gestation of pregnancy: Secondary | ICD-10-CM | POA: Insufficient documentation

## 2019-03-23 DIAGNOSIS — Z87891 Personal history of nicotine dependence: Secondary | ICD-10-CM | POA: Insufficient documentation

## 2019-03-23 DIAGNOSIS — N949 Unspecified condition associated with female genital organs and menstrual cycle: Secondary | ICD-10-CM

## 2019-03-23 DIAGNOSIS — O0990 Supervision of high risk pregnancy, unspecified, unspecified trimester: Secondary | ICD-10-CM

## 2019-03-23 LAB — URINALYSIS, ROUTINE W REFLEX MICROSCOPIC
Bilirubin Urine: NEGATIVE
Glucose, UA: NEGATIVE mg/dL
Hgb urine dipstick: NEGATIVE
Ketones, ur: NEGATIVE mg/dL
Leukocytes,Ua: NEGATIVE
Nitrite: NEGATIVE
Protein, ur: NEGATIVE mg/dL
Specific Gravity, Urine: 1.004 — ABNORMAL LOW (ref 1.005–1.030)
pH: 7 (ref 5.0–8.0)

## 2019-03-23 LAB — WET PREP, GENITAL
Clue Cells Wet Prep HPF POC: NONE SEEN
Sperm: NONE SEEN
Trich, Wet Prep: NONE SEEN
Yeast Wet Prep HPF POC: NONE SEEN

## 2019-03-23 MED ORDER — ACETAMINOPHEN 500 MG PO TABS
1000.0000 mg | ORAL_TABLET | Freq: Once | ORAL | Status: AC
  Administered 2019-03-23: 1000 mg via ORAL
  Filled 2019-03-23: qty 2

## 2019-03-23 MED ORDER — CYCLOBENZAPRINE HCL 10 MG PO TABS: 10.0000 mg | ORAL_TABLET | Freq: Every evening | ORAL | 0 refills | Status: DC | PRN

## 2019-03-23 NOTE — MAU Note (Signed)
Denise Gillespie is a 29 y.o. at [redacted]w[redacted]d here in MAU reporting: started feeling a lot of pressure while she was at work yesterday. Also felt a shooting pain up her back. No vaginal bleeding or LOF. +FM  Onset of complaint: yesterday  Pain score: back 8/10, pelvis 10/10  Vitals:   03/23/19 1411 03/23/19 1414  BP: 115/71   Pulse: (!) 130 99  Resp: 18   Temp: 99.6 F (37.6 C)   SpO2: 99%      Lab orders placed from triage: UA

## 2019-03-23 NOTE — MAU Provider Note (Signed)
History     CSN: [id number]  Arrival date and time: 03/23/19 1329   First Provider Initiated Contact with Patient 03/23/19 1424      Chief Complaint  Patient presents with  . Back Pain  . Pelvic Pain   HPI   Denise Gillespie is a 29 y.o. female G11P2002 @ [redacted]w[redacted]d here in MAU with pelvic pain and pressure. "it feels like a bowling ball is coming out". The pain worsens when she is standing or walking. The pain is worse in both sides of her lower abdomen. She has not tried anything for the pain other than laying down to rest. States the pain made her toss and turn throughout the night. She has not tried anything for the pain.   OB History    Gravida  3   Para  2   Term  2   Preterm      AB      Living  2     SAB      TAB      Ectopic      Multiple      Live Births  2           Past Medical History:  Diagnosis Date  . Abscess    multiple  . Anemia, iron deficiency   . Depression   . Hidradenitis   . Obesity   . Pregnancy induced hypertension     Past Surgical History:  Procedure Laterality Date  . CESAREAN SECTION    . INCISE AND DRAIN ABCESS      Family History  Problem Relation Age of Onset  . Hypertension Mother   . Migraines Mother   . Eczema Brother     Social History   Tobacco Use  . Smoking status: Former Smoker    Packs/day: 0.25    Types: Cigarettes    Quit date: 09/18/2018    Years since quitting: 0.5  . Smokeless tobacco: Never Used  . Tobacco comment: quit when found out pregnant   Substance Use Topics  . Alcohol use: Not Currently    Comment: Social  . Drug use: No    Allergies: No Known Allergies  Medications Prior to Admission  Medication Sig Dispense Refill Last Dose  . nitrofurantoin, macrocrystal-monohydrate, (MACROBID) 100 MG capsule Take 100 mg by mouth 2 (two) times daily.     . pantoprazole (PROTONIX) 20 MG tablet Take 1 tablet (20 mg total) by mouth daily. 30 tablet 1   . Prenatal Vit-Fe Fumarate-FA  (MULTIVITAMIN-PRENATAL) 27-0.8 MG TABS tablet Take 1 tablet by mouth daily at 12 noon. 30 tablet 9    Results for orders placed or performed during the hospital encounter of 03/23/19 (from the past 48 hour(s))  Urinalysis, Routine w reflex microscopic     Status: Abnormal   Collection Time: 03/23/19  2:31 PM  Result Value Ref Range   Color, Urine YELLOW YELLOW   APPearance HAZY (A) CLEAR   Specific Gravity, Urine 1.004 (L) 1.005 - 1.030   pH 7.0 5.0 - 8.0   Glucose, UA NEGATIVE NEGATIVE mg/dL   Hgb urine dipstick NEGATIVE NEGATIVE   Bilirubin Urine NEGATIVE NEGATIVE   Ketones, ur NEGATIVE NEGATIVE mg/dL   Protein, ur NEGATIVE NEGATIVE mg/dL   Nitrite NEGATIVE NEGATIVE   Leukocytes,Ua NEGATIVE NEGATIVE    Comment: Performed at [facility], [address], [place], [place] [address]  Wet prep, genital     Status: Abnormal   Collection Time:  03/23/19  3:19 PM   Specimen: Vaginal  Result Value Ref Range   Yeast Wet Prep HPF POC NONE SEEN NONE SEEN   Trich, Wet Prep NONE SEEN NONE SEEN   Clue Cells Wet Prep HPF POC NONE SEEN NONE SEEN   WBC, Wet Prep HPF POC FEW (A) NONE SEEN   Sperm NONE SEEN     Comment: Performed at [facility], [place] [address] [place],  [address]   Review of Systems  Constitutional: Negative for fever.  Gastrointestinal: Positive for abdominal pain. Negative for nausea and vomiting.  Genitourinary: Positive for pelvic pain. Negative for dysuria.   Physical Exam   Blood pressure 115/71, pulse 99, temperature 99.6 F (37.6 C), temperature source Oral, resp. rate 18, last menstrual period 08/10/2018, SpO2 99 %.  Physical Exam  Constitutional: She is oriented to person, place, and time. She appears well-developed and well-nourished.  HENT:  Head: Normocephalic.  GI: Soft. Normal appearance. There is generalized abdominal tenderness. There is no rigidity, no rebound and no guarding.  Genitourinary:    Genitourinary Comments: Cervix: closed,  thick, posterior.    Musculoskeletal:        General: Normal range of motion.  Neurological: She is alert and oriented to person, place, and time.  Skin: Skin is warm.  Psychiatric: Her behavior is normal.   Fetal Tracing: Baseline: 140 bpm Variability: Moderate  Accelerations: 15x15 Decelerations: None Toco: Occasional with UI  MAU Course  Procedures  None  MDM  Oral hydration encouraged.  Tylenol 1 gram given PO Cervix is closed. Likely round ligament pain; discussed wearing a pregnancy support belt.   Assessment and Plan   A:  1. Pelvic pain affecting pregnancy in third trimester, antepartum   2. [redacted] weeks gestation of pregnancy   3. Round ligament pain     P:  Discharge home with strict return precautions Preterm labor precautions Return to MAU if symptoms worsen Rx: Flexeril for bedtime Wear pregnancy support belt while up and moving   Arshad Oberholzer, Denise Malta I, NP 03/24/2019 8:41 AM

## 2019-03-23 NOTE — Telephone Encounter (Signed)
Denise Gillespie called and reports having constant pelvic/ lower abdominal pain =10  that started yesterday and had to leave work. She denies any vaginal bleeding . She confirms good fetal movement. Denies contractions.  Patient in tears. Discussed with provider and advised Denise Gillespie to go to [facility] for evaluation asap. She voices understanding and states her Mother will take her now.  Denise Houchin,RN

## 2019-03-23 NOTE — Discharge Instructions (Signed)
Activity Restriction During Pregnancy Your health care provider may recommend specific activity restrictions during pregnancy for a variety of reasons. Activity restriction may require that you limit activities that require great effort, such as exercise, lifting, or sex. The type of activity restriction will vary for each person, depending on your risk or the problems you are having. Activity restriction may be recommended for a period of time until your baby is delivered. Why are activity restrictions recommended? Activity restriction may be recommended if:  Your placenta is partially or completely covering the opening of your cervix (placenta previa).  There is bleeding between the wall of the uterus and the amniotic sac in the first trimester of pregnancy (subchorionic hemorrhage).  You went into labor too early (preterm labor).  You have a history of miscarriage.  You have a condition that causes high blood pressure during pregnancy (preeclampsia or eclampsia).  You are pregnant with more than one baby.  Your baby is not growing well. What are the risks? The risks depend on your specific restriction. Strict bed rest has the most physical and emotional risks and is no longer routinely recommended. Risks of strict bed rest include:  Loss of muscle conditioning from not moving.  Blood clots.  Social isolation.  Depression.  Loss of income. Talk with your health care team about activity restriction to decide if it is best for you and your baby. Even if you are having problems during your pregnancy, you may be able to continue with normal levels of activity with careful monitoring by your health care team. Follow these instructions at home: If needed, based on your overall health and the health of your baby, your health care provider will decide which type of activity restriction is right for you. Activity restrictions may include:  Not lifting anything heavier than 10 pounds (4.5  kg).  Avoiding activities that take a lot of physical effort.  No lifting or straining.  Resting in a sitting position or lying down for periods of time during the day. Pelvic rest may be recommended along with activity restrictions. If pelvic rest is recommended, then:  Do not have sex, an orgasm, or use sexual stimulation.  Do not use tampons. Do not douche. Do not put anything into your vagina.  Do not lift anything that is heavier than 10 lb (4.5 kg).  Avoid activities that require a lot of effort.  Avoid any activity in which your pelvic muscles could become strained, such as squatting. Questions to ask your health care provider  Why is my activity being limited?  How will activity restrictions affect my body?  Why is rest helpful for me and my baby?  What activities can I do?  When can I return to normal activities? When should I seek immediate medical care? Seek immediate medical care if you have:  Vaginal bleeding.  Vaginal discharge.  Cramping pain in your lower abdomen.  Regular contractions.  A low, dull backache. Summary  Your health care provider may recommend specific activity restrictions during pregnancy for a variety of reasons.  Activity restriction may require that you limit activities such as exercise, lifting, sex, or any other activity that requires great effort.  Discuss the risks and benefits of activity restriction with your health care team to decide if it is best for you and your baby.  Contact your health care provider right away if you think you are having contractions, or if you notice vaginal bleeding, discharge, or cramping. This information is not   intended to replace advice given to you by your health care provider. Make sure you discuss any questions you have with your health care provider. Document Revised: 09/22/2018 Document Reviewed: 04/21/2017 Elsevier Patient Education  2020 [employer]   Round Ligament Pain During  Pregnancy   Round ligament pain is a sharp pain or jabbing feeling often felt in the lower belly or groin area on one or both sides. It is one of the most common complaints during pregnancy and is considered a normal part of pregnancy. It is most often felt during the second trimester.   Here is what you need to know about round ligament pain, including some tips to help you feel better.   Causes of Round Ligament Pain:    Several thick ligaments surround and support your womb (uterus) as it grows during pregnancy. One of them is called the round ligament.   The round ligament connects the front part of the womb to your groin, the area where your legs attach to your pelvis. The round ligament normally tightens and relaxes slowly.   As your baby and womb grow, the round ligament stretches. That makes it more likely to become strained.   Sudden movements can cause the ligament to tighten quickly, like a rubber band snapping. This causes a sudden and quick jabbing feeling.   Symptoms of Round Ligament Pain   Round ligament pain can be concerning and uncomfortable. But it is considered normal as your body changes during pregnancy.   The symptoms of round ligament pain include a sharp, sudden spasm in the belly. It usually affects the right side, but it may happen on both sides. The pain only lasts a few seconds.   Exercise may cause the pain, as will rapid movements such as:   sneezing  coughing  laughing  rolling over in bed  standing up too quickly   Treatment of Round Ligament Pain   Here are some tips that may help reduce your discomfort:   Pain relief. Take over-the-counter acetaminophen for pain, if necessary. Ask your doctor if this is OK.   Exercise. Get plenty of exercise to keep your stomach (core) muscles strong. Doing stretching exercises or prenatal yoga can be helpful. Ask your doctor which exercises are safe for you and your baby.   A helpful exercise involves putting  your hands and knees on the floor, lowering your head, and pushing your backside into the air.   Avoid sudden movements. Change positions slowly (such as standing up or sitting down) to avoid sudden movements that may cause stretching and pain.   Flex your hips. Bend and flex your hips before you cough, sneeze, or laugh to avoid pulling on the ligaments.   Apply warmth. A heating pad or warm bath may be helpful. Ask your doctor if this is OK. Extreme heat can be dangerous to the baby.   You should try to modify your daily activity level and avoid positions that may worsen the condition.   When to Call the Doctor/Midwife   Always tell your doctor or midwife about any type of pain you have during pregnancy. Round ligament pain is quick and doesn't last long.   Call your health care provider immediately if you have:   severe pain  fever  chills  pain on urination  difficulty walking   Belly pain during pregnancy can be due to many different causes. It is important for your doctor to rule out more serious conditions, including pregnancy complications  such as placenta abruption or non-pregnancy illnesses such as:   inguinal hernia  appendicitis  stomach, liver, and kidney problems  Preterm labor pains may sometimes be mistaken for round ligament pain.

## 2019-03-24 ENCOUNTER — Ambulatory Visit (INDEPENDENT_AMBULATORY_CARE_PROVIDER_SITE_OTHER): Payer: Medicaid Other | Admitting: Obstetrics and Gynecology

## 2019-03-24 ENCOUNTER — Other Ambulatory Visit: Payer: Medicaid Other

## 2019-03-24 VITALS — BP 137/82 | HR 101 | Wt 268.9 lb

## 2019-03-24 DIAGNOSIS — Z3A32 32 weeks gestation of pregnancy: Secondary | ICD-10-CM

## 2019-03-24 DIAGNOSIS — F329 Major depressive disorder, single episode, unspecified: Secondary | ICD-10-CM | POA: Diagnosis not present

## 2019-03-24 DIAGNOSIS — Z6841 Body Mass Index (BMI) 40.0 and over, adult: Secondary | ICD-10-CM

## 2019-03-24 DIAGNOSIS — Z23 Encounter for immunization: Secondary | ICD-10-CM | POA: Diagnosis not present

## 2019-03-24 DIAGNOSIS — O99340 Other mental disorders complicating pregnancy, unspecified trimester: Secondary | ICD-10-CM

## 2019-03-24 DIAGNOSIS — Z98891 History of uterine scar from previous surgery: Secondary | ICD-10-CM | POA: Diagnosis not present

## 2019-03-24 DIAGNOSIS — Z8759 Personal history of other complications of pregnancy, childbirth and the puerperium: Secondary | ICD-10-CM

## 2019-03-24 DIAGNOSIS — O0993 Supervision of high risk pregnancy, unspecified, third trimester: Secondary | ICD-10-CM

## 2019-03-24 DIAGNOSIS — F32A Depression, unspecified: Secondary | ICD-10-CM

## 2019-03-24 DIAGNOSIS — O99210 Obesity complicating pregnancy, unspecified trimester: Secondary | ICD-10-CM

## 2019-03-24 DIAGNOSIS — R87612 Low grade squamous intraepithelial lesion on cytologic smear of cervix (LGSIL): Secondary | ICD-10-CM

## 2019-03-24 DIAGNOSIS — O0990 Supervision of high risk pregnancy, unspecified, unspecified trimester: Secondary | ICD-10-CM

## 2019-03-24 DIAGNOSIS — O99343 Other mental disorders complicating pregnancy, third trimester: Secondary | ICD-10-CM | POA: Diagnosis not present

## 2019-03-24 DIAGNOSIS — O99213 Obesity complicating pregnancy, third trimester: Secondary | ICD-10-CM

## 2019-03-24 LAB — GC/CHLAMYDIA PROBE AMP (~~LOC~~) NOT AT ARMC
Chlamydia: NEGATIVE
Comment: NEGATIVE
Comment: NORMAL
Neisseria Gonorrhea: NEGATIVE

## 2019-03-24 NOTE — Progress Notes (Signed)
Prenatal Visit Note Date: 03/24/2019 Clinic: [facility]  Subjective:  Denise Gillespie is a 29 y.o. G3P2002 at [redacted]w[redacted]d being seen today for ongoing prenatal care.  She is currently monitored for the following issues for this high-risk pregnancy and has Hydradenitis; Tobacco use disorder; Current severe episode of major depressive disorder without psychotic features (HCC); Generalized anxiety disorder; Supervision of high risk pregnancy, antepartum; History of severe pre-eclampsia; Obesity in pregnancy; Depression; History of VBAC; LGSIL on Pap smear of cervix; Alpha thalassemia trait; and BMI 45.0-49.9, adult (HCC) on their problem list.  Patient reports she had soda on the way here..   Contractions: Irritability. Vag. Bleeding: None.  Movement: Present. Denies leaking of fluid.   The following portions of the patient's history were reviewed and updated as appropriate: allergies, current medications, past family history, past medical history, past social history, past surgical history and problem list. Problem list updated.  Objective:   Vitals:   03/24/19 0827  BP: 137/82  Pulse: (!) 101  Weight: 268 lb 14.4 oz (122 kg)    Fetal Status: Fetal Heart Rate (bpm): 143   Movement: Present     General:  Alert, oriented and cooperative. Patient is in no acute distress.  Skin: Skin is warm and dry. No rash noted.   Cardiovascular: Normal heart rate noted  Respiratory: Normal respiratory effort, no problems with respiration noted  Abdomen: Soft, gravid, appropriate for gestational age. Pain/Pressure: Present     Pelvic:  Cervical exam deferred        Extremities: Normal range of motion.  Edema: None  Mental Status: Normal mood and affect. Normal behavior. Normal judgment and thought content.   Urinalysis:      Assessment and Plan:  Pregnancy: G3P2002 at [redacted]w[redacted]d  1. Supervision of high risk pregnancy, antepartum Routine care BTL papers today 28wk labs  tomorrow  2. History of severe pre-eclampsia Too late to start low dose asa  3. Obesity in pregnancy Follow weights  4. Depression during pregnancy, antepartum No issues on no meds  5. History of VBAC Consent today  6. LGSIL on Pap smear of cervix colpo PP. D/w pt today  7. BMI 50.0-59.9, adult (HCC)  Preterm labor symptoms and general obstetric precautions including but not limited to vaginal bleeding, contractions, leaking of fluid and fetal movement were reviewed in detail with the patient. Please refer to After Visit Summary for other counseling recommendations.  Return in about 1 day (around 03/25/2019) for lab only visit for 2h GTT. 3/25 hrob in person.   Tyrone Bing, MD

## 2019-03-25 ENCOUNTER — Other Ambulatory Visit: Payer: Self-pay

## 2019-03-25 ENCOUNTER — Other Ambulatory Visit: Payer: Medicaid Other

## 2019-03-25 DIAGNOSIS — O0990 Supervision of high risk pregnancy, unspecified, unspecified trimester: Secondary | ICD-10-CM

## 2019-03-26 LAB — CBC
Hematocrit: 30.6 % — ABNORMAL LOW (ref 34.0–46.6)
Hemoglobin: 9.7 g/dL — ABNORMAL LOW (ref 11.1–15.9)
MCH: 27 pg (ref 26.6–33.0)
MCHC: 31.7 g/dL (ref 31.5–35.7)
MCV: 85 fL (ref 79–97)
Platelets: 225 10*3/uL (ref 150–450)
RBC: 3.59 x10E6/uL — ABNORMAL LOW (ref 3.77–5.28)
RDW: 12.9 % (ref 11.7–15.4)
WBC: 10.1 10*3/uL (ref 3.4–10.8)

## 2019-03-26 LAB — GLUCOSE TOLERANCE, 2 HOURS W/ 1HR
Glucose, 1 hour: 167 mg/dL (ref 65–179)
Glucose, 2 hour: 109 mg/dL (ref 65–152)
Glucose, Fasting: 95 mg/dL — ABNORMAL HIGH (ref 65–91)

## 2019-03-26 LAB — HIV ANTIBODY (ROUTINE TESTING W REFLEX): HIV Screen 4th Generation wRfx: NONREACTIVE

## 2019-03-26 LAB — RPR: RPR Ser Ql: NONREACTIVE

## 2019-03-27 ENCOUNTER — Encounter: Payer: Self-pay | Admitting: Obstetrics and Gynecology

## 2019-03-27 DIAGNOSIS — Z8632 Personal history of gestational diabetes: Secondary | ICD-10-CM | POA: Insufficient documentation

## 2019-03-27 DIAGNOSIS — O24419 Gestational diabetes mellitus in pregnancy, unspecified control: Secondary | ICD-10-CM | POA: Insufficient documentation

## 2019-03-27 HISTORY — DX: Personal history of gestational diabetes: Z86.32

## 2019-03-28 ENCOUNTER — Other Ambulatory Visit: Payer: Self-pay | Admitting: Obstetrics & Gynecology

## 2019-03-28 ENCOUNTER — Telehealth: Payer: Self-pay | Admitting: *Deleted

## 2019-03-28 ENCOUNTER — Encounter: Payer: Self-pay | Admitting: *Deleted

## 2019-03-28 DIAGNOSIS — O99013 Anemia complicating pregnancy, third trimester: Secondary | ICD-10-CM | POA: Insufficient documentation

## 2019-03-28 DIAGNOSIS — D509 Iron deficiency anemia, unspecified: Secondary | ICD-10-CM

## 2019-03-28 DIAGNOSIS — O24419 Gestational diabetes mellitus in pregnancy, unspecified control: Secondary | ICD-10-CM

## 2019-03-28 MED ORDER — GLUCOSE BLOOD VI STRP: ORAL_STRIP | 12 refills | Status: DC

## 2019-03-28 MED ORDER — ACCU-CHEK GUIDE W/DEVICE KIT: 1.0000 | PACK | Freq: Four times a day (QID) | 0 refills | Status: DC

## 2019-03-28 MED ORDER — POLYSACCHARIDE IRON COMPLEX 150 MG PO CAPS: 150.0000 mg | ORAL_CAPSULE | Freq: Every day | ORAL | 2 refills | Status: DC

## 2019-03-28 MED ORDER — ACCU-CHEK SOFTCLIX LANCETS MISC: 12 refills | Status: DC

## 2019-03-28 NOTE — Telephone Encounter (Addendum)
-----   Message from [place] Bing, MD sent at 03/27/2019  7:11 PM EDT ----- She has gdm. Please set her up with teaching, supplies, etc. thanks  3/15  1650  Called pt and informed her of test results showing Gestational Diabetes. She will need to meet with Diabetes educator for diet education and CBG instruction. Pt agreed to appt on 3/23 @ 1015. Her prescriptions for supplies will be sent to her pharmacy. Pt was instructed to obtain these and bring to Diabetes Ed appt. Pt voiced understanding and had no questions.

## 2019-04-04 ENCOUNTER — Other Ambulatory Visit: Payer: Self-pay | Admitting: *Deleted

## 2019-04-04 DIAGNOSIS — O24419 Gestational diabetes mellitus in pregnancy, unspecified control: Secondary | ICD-10-CM

## 2019-04-05 ENCOUNTER — Other Ambulatory Visit: Payer: Medicaid Other

## 2019-04-07 ENCOUNTER — Ambulatory Visit (HOSPITAL_COMMUNITY): Payer: Medicaid Other | Admitting: *Deleted

## 2019-04-07 ENCOUNTER — Encounter (HOSPITAL_COMMUNITY): Payer: Self-pay

## 2019-04-07 ENCOUNTER — Other Ambulatory Visit: Payer: Self-pay

## 2019-04-07 ENCOUNTER — Encounter: Payer: Self-pay | Admitting: Obstetrics and Gynecology

## 2019-04-07 ENCOUNTER — Other Ambulatory Visit (HOSPITAL_COMMUNITY): Payer: Self-pay | Admitting: Obstetrics and Gynecology

## 2019-04-07 ENCOUNTER — Ambulatory Visit (HOSPITAL_COMMUNITY)
Admission: RE | Admit: 2019-04-07 | Discharge: 2019-04-07 | Disposition: A | Discharge status: Home / Self Care | Payer: Medicaid Other | Source: Ambulatory Visit | Attending: Obstetrics and Gynecology | Admitting: Obstetrics and Gynecology

## 2019-04-07 ENCOUNTER — Other Ambulatory Visit (HOSPITAL_COMMUNITY): Payer: Self-pay | Admitting: *Deleted

## 2019-04-07 ENCOUNTER — Encounter: Payer: Medicaid Other | Attending: Obstetrics and Gynecology | Admitting: Registered"

## 2019-04-07 ENCOUNTER — Ambulatory Visit (INDEPENDENT_AMBULATORY_CARE_PROVIDER_SITE_OTHER): Payer: Medicaid Other | Admitting: Obstetrics and Gynecology

## 2019-04-07 ENCOUNTER — Ambulatory Visit: Payer: Medicaid Other | Admitting: Registered"

## 2019-04-07 VITALS — BP 130/81 | HR 110 | Wt 271.3 lb

## 2019-04-07 DIAGNOSIS — Z713 Dietary counseling and surveillance: Secondary | ICD-10-CM | POA: Insufficient documentation

## 2019-04-07 DIAGNOSIS — Z362 Encounter for other antenatal screening follow-up: Secondary | ICD-10-CM | POA: Diagnosis not present

## 2019-04-07 DIAGNOSIS — Z3A34 34 weeks gestation of pregnancy: Secondary | ICD-10-CM

## 2019-04-07 DIAGNOSIS — O24419 Gestational diabetes mellitus in pregnancy, unspecified control: Secondary | ICD-10-CM | POA: Diagnosis not present

## 2019-04-07 DIAGNOSIS — O34219 Maternal care for unspecified type scar from previous cesarean delivery: Secondary | ICD-10-CM | POA: Diagnosis not present

## 2019-04-07 DIAGNOSIS — Z8759 Personal history of other complications of pregnancy, childbirth and the puerperium: Secondary | ICD-10-CM

## 2019-04-07 DIAGNOSIS — O0990 Supervision of high risk pregnancy, unspecified, unspecified trimester: Secondary | ICD-10-CM

## 2019-04-07 DIAGNOSIS — Z148 Genetic carrier of other disease: Secondary | ICD-10-CM | POA: Diagnosis not present

## 2019-04-07 DIAGNOSIS — Z3009 Encounter for other general counseling and advice on contraception: Secondary | ICD-10-CM | POA: Insufficient documentation

## 2019-04-07 DIAGNOSIS — O09299 Supervision of pregnancy with other poor reproductive or obstetric history, unspecified trimester: Secondary | ICD-10-CM | POA: Diagnosis not present

## 2019-04-07 DIAGNOSIS — O24410 Gestational diabetes mellitus in pregnancy, diet controlled: Secondary | ICD-10-CM

## 2019-04-07 DIAGNOSIS — R87612 Low grade squamous intraepithelial lesion on cytologic smear of cervix (LGSIL): Secondary | ICD-10-CM

## 2019-04-07 DIAGNOSIS — E669 Obesity, unspecified: Secondary | ICD-10-CM

## 2019-04-07 DIAGNOSIS — O99213 Obesity complicating pregnancy, third trimester: Secondary | ICD-10-CM | POA: Insufficient documentation

## 2019-04-07 DIAGNOSIS — O99343 Other mental disorders complicating pregnancy, third trimester: Secondary | ICD-10-CM

## 2019-04-07 DIAGNOSIS — O99210 Obesity complicating pregnancy, unspecified trimester: Secondary | ICD-10-CM

## 2019-04-07 DIAGNOSIS — Z3A00 Weeks of gestation of pregnancy not specified: Secondary | ICD-10-CM | POA: Insufficient documentation

## 2019-04-07 DIAGNOSIS — Z98891 History of uterine scar from previous surgery: Secondary | ICD-10-CM

## 2019-04-07 DIAGNOSIS — O99810 Abnormal glucose complicating pregnancy: Secondary | ICD-10-CM

## 2019-04-07 NOTE — Patient Instructions (Signed)
Third Trimester of Pregnancy The third trimester is from week 28 through week 40 (months 7 through 9). The third trimester is a time when the unborn baby (fetus) is growing rapidly. At the end of the ninth month, the fetus is about 20 inches in length and weighs 6-10 pounds. Body changes during your third trimester Your body will continue to go through many changes during pregnancy. The changes vary from woman to woman. During the third trimester:  Your weight will continue to increase. You can expect to gain 25-35 pounds (11-16 kg) by the end of the pregnancy.  You may begin to get stretch marks on your hips, abdomen, and breasts.  You may urinate more often because the fetus is moving lower into your pelvis and pressing on your bladder.  You may develop or continue to have heartburn. This is caused by increased hormones that slow down muscles in the digestive tract.  You may develop or continue to have constipation because increased hormones slow digestion and cause the muscles that push waste through your intestines to relax.  You may develop hemorrhoids. These are swollen veins (varicose veins) in the rectum that can itch or be painful.  You may develop swollen, bulging veins (varicose veins) in your legs.  You may have increased body aches in the pelvis, back, or thighs. This is due to weight gain and increased hormones that are relaxing your joints.  You may have changes in your hair. These can include thickening of your hair, rapid growth, and changes in texture. Some women also have hair loss during or after pregnancy, or hair that feels dry or thin. Your hair will most likely return to normal after your baby is born.  Your breasts will continue to grow and they will continue to become tender. A yellow fluid (colostrum) may leak from your breasts. This is the first milk you are producing for your baby.  Your belly button may stick out.  You may notice more swelling in your hands,  face, or ankles.  You may have increased tingling or numbness in your hands, arms, and legs. The skin on your belly may also feel numb.  You may feel short of breath because of your expanding uterus.  You may have more problems sleeping. This can be caused by the size of your belly, increased need to urinate, and an increase in your body's metabolism.  You may notice the fetus "dropping," or moving lower in your abdomen (lightening).  You may have increased vaginal discharge.  You may notice your joints feel loose and you may have pain around your pelvic bone. What to expect at prenatal visits You will have prenatal exams every 2 weeks until week 36. Then you will have weekly prenatal exams. During a routine prenatal visit:  You will be weighed to make sure you and the baby are growing normally.  Your blood pressure will be taken.  Your abdomen will be measured to track your baby's growth.  The fetal heartbeat will be listened to.  Any test results from the previous visit will be discussed.  You may have a cervical check near your due date to see if your cervix has softened or thinned (effaced).  You will be tested for Group B streptococcus. This happens between 35 and 37 weeks. Your health care provider may ask you:  What your birth plan is.  How you are feeling.  If you are feeling the baby move.  If you have had any abnormal   symptoms, such as leaking fluid, bleeding, severe headaches, or abdominal cramping.  If you are using any tobacco products, including cigarettes, chewing tobacco, and electronic cigarettes.  If you have any questions. Other tests or screenings that may be performed during your third trimester include:  Blood tests that check for low iron levels (anemia).  Fetal testing to check the health, activity level, and growth of the fetus. Testing is done if you have certain medical conditions or if there are problems during the pregnancy.  Nonstress test  (NST). This test checks the health of your baby to make sure there are no signs of problems, such as the baby not getting enough oxygen. During this test, a belt is placed around your belly. The baby is made to move, and its heart rate is monitored during movement. What is false labor? False labor is a condition in which you feel small, irregular tightenings of the muscles in the womb (contractions) that usually go away with rest, changing position, or drinking water. These are called Braxton Hicks contractions. Contractions may last for hours, days, or even weeks before true labor sets in. If contractions come at regular intervals, become more frequent, increase in intensity, or become painful, you should see your health care provider. What are the signs of labor?  Abdominal cramps.  Regular contractions that start at 10 minutes apart and become stronger and more frequent with time.  Contractions that start on the top of the uterus and spread down to the lower abdomen and back.  Increased pelvic pressure and dull back pain.  A watery or bloody mucus discharge that comes from the vagina.  Leaking of amniotic fluid. This is also known as your "water breaking." It could be a slow trickle or a gush. Let your health care provider know if it has a color or strange odor. If you have any of these signs, call your health care provider right away, even if it is before your due date. Follow these instructions at home: Medicines  Follow your health care provider's instructions regarding medicine use. Specific medicines may be either safe or unsafe to take during pregnancy.  Take a prenatal vitamin that contains at least 600 micrograms (mcg) of folic acid.  If you develop constipation, try taking a stool softener if your health care provider approves. Eating and drinking   Eat a balanced diet that includes fresh fruits and vegetables, whole grains, good sources of protein such as meat, eggs, or tofu,  and low-fat dairy. Your health care provider will help you determine the amount of weight gain that is right for you.  Avoid raw meat and uncooked cheese. These carry germs that can cause birth defects in the baby.  If you have low calcium intake from food, talk to your health care provider about whether you should take a daily calcium supplement.  Eat four or five small meals rather than three large meals a day.  Limit foods that are high in fat and processed sugars, such as fried and sweet foods.  To prevent constipation: ? Drink enough fluid to keep your urine clear or pale yellow. ? Eat foods that are high in fiber, such as fresh fruits and vegetables, whole grains, and beans. Activity  Exercise only as directed by your health care provider. Most women can continue their usual exercise routine during pregnancy. Try to exercise for 30 minutes at least 5 days a week. Stop exercising if you experience uterine contractions.  Avoid heavy lifting.  Do   not exercise in extreme heat or humidity, or at high altitudes.  Wear low-heel, comfortable shoes.  Practice good posture.  You may continue to have sex unless your health care provider tells you otherwise. Relieving pain and discomfort  Take frequent breaks and rest with your legs elevated if you have leg cramps or low back pain.  Take warm sitz baths to soothe any pain or discomfort caused by hemorrhoids. Use hemorrhoid cream if your health care provider approves.  Wear a good support bra to prevent discomfort from breast tenderness.  If you develop varicose veins: ? Wear support pantyhose or compression stockings as told by your healthcare provider. ? Elevate your feet for 15 minutes, 3-4 times a day. Prenatal care  Write down your questions. Take them to your prenatal visits.  Keep all your prenatal visits as told by your health care provider. This is important. Safety  Wear your seat belt at all times when driving.  Make  a list of emergency phone numbers, including numbers for family, friends, the hospital, and police and fire departments. General instructions  Avoid cat litter boxes and soil used by cats. These carry germs that can cause birth defects in the baby. If you have a cat, ask someone to clean the litter box for you.  Do not travel far distances unless it is absolutely necessary and only with the approval of your health care provider.  Do not use hot tubs, steam rooms, or saunas.  Do not drink alcohol.  Do not use any products that contain nicotine or tobacco, such as cigarettes and e-cigarettes. If you need help quitting, ask your health care provider.  Do not use any medicinal herbs or unprescribed drugs. These chemicals affect the formation and growth of the baby.  Do not douche or use tampons or scented sanitary pads.  Do not cross your legs for long periods of time.  To prepare for the arrival of your baby: ? Take prenatal classes to understand, practice, and ask questions about labor and delivery. ? Make a trial run to the hospital. ? Visit the hospital and tour the maternity area. ? Arrange for maternity or paternity leave through employers. ? Arrange for family and friends to take care of pets while you are in the hospital. ? Purchase a rear-facing car seat and make sure you know how to install it in your car. ? Pack your hospital bag. ? Prepare the baby's nursery. Make sure to remove all pillows and stuffed animals from the baby's crib to prevent suffocation.  Visit your dentist if you have not gone during your pregnancy. Use a soft toothbrush to brush your teeth and be gentle when you floss. Contact a health care provider if:  You are unsure if you are in labor or if your water has broken.  You become dizzy.  You have mild pelvic cramps, pelvic pressure, or nagging pain in your abdominal area.  You have lower back pain.  You have persistent nausea, vomiting, or  diarrhea.  You have an unusual or bad smelling vaginal discharge.  You have pain when you urinate. Get help right away if:  Your water breaks before 37 weeks.  You have regular contractions less than 5 minutes apart before 37 weeks.  You have a fever.  You are leaking fluid from your vagina.  You have spotting or bleeding from your vagina.  You have severe abdominal pain or cramping.  You have rapid weight loss or weight gain.  You have   shortness of breath with chest pain.  You notice sudden or extreme swelling of your face, hands, ankles, feet, or legs.  Your baby makes fewer than 10 movements in 2 hours.  You have severe headaches that do not go away when you take medicine.  You have vision changes. Summary  The third trimester is from week 28 through week 40, months 7 through 9. The third trimester is a time when the unborn baby (fetus) is growing rapidly.  During the third trimester, your discomfort may increase as you and your baby continue to gain weight. You may have abdominal, leg, and back pain, sleeping problems, and an increased need to urinate.  During the third trimester your breasts will keep growing and they will continue to become tender. A yellow fluid (colostrum) may leak from your breasts. This is the first milk you are producing for your baby.  False labor is a condition in which you feel small, irregular tightenings of the muscles in the womb (contractions) that eventually go away. These are called Braxton Hicks contractions. Contractions may last for hours, days, or even weeks before true labor sets in.  Signs of labor can include: abdominal cramps; regular contractions that start at 10 minutes apart and become stronger and more frequent with time; watery or bloody mucus discharge that comes from the vagina; increased pelvic pressure and dull back pain; and leaking of amniotic fluid. This information is not intended to replace advice given to you by your  health care provider. Make sure you discuss any questions you have with your health care provider. Document Revised: 04/22/2018 Document Reviewed: 02/05/2016 Elsevier Patient Education  2020 [employer]  

## 2019-04-07 NOTE — Progress Notes (Signed)
Patient was seen on 04/07/19 for Gestational Diabetes self-management. EDD 05/17/19. Patient states no history of GDM. Patient states she is a picky eater but diet recall indicates she eats variety of all food groups. Beverages include water, Cheerwine, Motts Tots apple juice (diluted juice), cranberry blends, Dragon fruit starbucks.  Patient is likely consuming excess carbohydrates in the form of sweetened beverages, maybe rice.   The following learning objectives were met by the patient :   States the definition of Gestational Diabetes  States why dietary management is important in controlling blood glucose  Describes the effects of carbohydrates on blood glucose levels  Demonstrates ability to create a balanced meal plan  Demonstrates carbohydrate counting   States when to check blood glucose levels  Demonstrates proper blood glucose monitoring techniques  States the effect of stress and exercise on blood glucose levels  States the importance of limiting caffeine and abstaining from alcohol and smoking  Plan:  Aim for 3 Carbohydrate Choices per meal (45 grams) +/- 1 either way  Aim for 1-2 Carbohydrate Choices per snack Begin reading food labels for Total Carbohydrate of foods If OK with your MD, consider  increasing your activity level by walking, Arm Chair Exercises or other activity daily as tolerated Begin checking Blood Glucose before breakfast and 2 hours after first bite of breakfast, lunch and dinner as directed by MD  Bring Log Book/Sheet and meter to every medical appointment OR use Baby Scripts: Patient was introduced to [employer] and may use as record of blood glucose electronically . Take medication if directed by MD  Patient already has a meter and is testing pre breakfast and 2 hours after each meal x1/day Review of Log Book shows: 119 mg/dL FBS; 140 mg/dL 1 hr post meal.  Patient instructed to monitor glucose levels: FBS: 60 - 95 mg/dl 2 hour: <120  mg/dl  Patient received the following handouts:  Nutrition Diabetes and Pregnancy  Carbohydrate Counting List  Blood glucose Log Sheet  Patient will be seen for follow-up in as needed.

## 2019-04-07 NOTE — Progress Notes (Deleted)
BRx Glucose readings:  Pt has been non compliant, no recordings in BRx

## 2019-04-07 NOTE — Progress Notes (Signed)
Subjective:  Denise Gillespie is a 29 y.o. G3P2002 at [redacted]w[redacted]d being seen today for ongoing prenatal care.  She is currently monitored for the following issues for this high-risk pregnancy and has Hydradenitis; Tobacco use disorder; Current severe episode of major depressive disorder without psychotic features (HCC); Generalized anxiety disorder; Supervision of high risk pregnancy, antepartum; History of severe pre-eclampsia; Obesity in pregnancy; Depression; History of VBAC; LGSIL on Pap smear of cervix; Alpha thalassemia trait; BMI 45.0-49.9, adult (HCC); GDM (gestational diabetes mellitus); Maternal iron deficiency anemia complicating pregnancy in third trimester; and Unwanted fertility on their problem list.  Patient reports no complaints.  Contractions: Irritability. Vag. Bleeding: None.  Movement: Present. Denies leaking of fluid.   The following portions of the patient's history were reviewed and updated as appropriate: allergies, current medications, past family history, past medical history, past social history, past surgical history and problem list. Problem list updated.  Objective:   Vitals:   04/07/19 1034 04/07/19 1037  BP: (!) 137/91 130/81  Pulse: 98 (!) 110  Weight: 271 lb 4.8 oz (123.1 kg)     Fetal Status: Fetal Heart Rate (bpm): 158   Movement: Present     General:  Alert, oriented and cooperative. Patient is in no acute distress.  Skin: Skin is warm and dry. No rash noted.   Cardiovascular: Normal heart rate noted  Respiratory: Normal respiratory effort, no problems with respiration noted  Abdomen: Soft, gravid, appropriate for gestational age. Pain/Pressure: Absent     Pelvic:  Cervical exam deferred        Extremities: Normal range of motion.  Edema: Trace  Mental Status: Normal mood and affect. Normal behavior. Normal judgment and thought content.   Urinalysis:      Assessment and Plan:  Pregnancy: G3P2002 at [redacted]w[redacted]d  1. Supervision of high risk pregnancy,  antepartum Stable  2. Diet controlled gestational diabetes mellitus (GDM) in third trimester Just started checking CBG's the other day. Did not bring CBG readings. Pt provided paper log to record CBG's. Instructed to bring to each office visit Importance of check ing CBG's and following diet reviewed with pt U/S today growth > 99 %, starting weekly BPP as per MFM recommendations Will have pt return next week to see MD and review CBG's and possible medication  3. History of severe pre-eclampsia Stable  4. History of VBAC Consent signed  5. LGSIL on Pap smear of cervix Colpo PP  6. Unwanted fertility Consent obtained  Preterm labor symptoms and general obstetric precautions including but not limited to vaginal bleeding, contractions, leaking of fluid and fetal movement were reviewed in detail with the patient. Please refer to After Visit Summary for other counseling recommendations.  Return in about 1 week (around 04/14/2019) for OB visit, face to face, MD provider.   Hermina Staggers, MD

## 2019-04-13 ENCOUNTER — Other Ambulatory Visit: Payer: Self-pay

## 2019-04-13 ENCOUNTER — Ambulatory Visit (HOSPITAL_COMMUNITY): Payer: Medicaid Other | Admitting: *Deleted

## 2019-04-13 ENCOUNTER — Ambulatory Visit (HOSPITAL_COMMUNITY)
Admission: RE | Admit: 2019-04-13 | Discharge: 2019-04-13 | Disposition: A | Discharge status: Home / Self Care | Payer: Medicaid Other | Source: Ambulatory Visit | Attending: Obstetrics and Gynecology | Admitting: Obstetrics and Gynecology

## 2019-04-13 ENCOUNTER — Encounter (HOSPITAL_COMMUNITY): Payer: Self-pay

## 2019-04-13 DIAGNOSIS — O99210 Obesity complicating pregnancy, unspecified trimester: Secondary | ICD-10-CM

## 2019-04-13 DIAGNOSIS — O0990 Supervision of high risk pregnancy, unspecified, unspecified trimester: Secondary | ICD-10-CM | POA: Insufficient documentation

## 2019-04-13 DIAGNOSIS — Z148 Genetic carrier of other disease: Secondary | ICD-10-CM | POA: Diagnosis not present

## 2019-04-13 DIAGNOSIS — O09299 Supervision of pregnancy with other poor reproductive or obstetric history, unspecified trimester: Secondary | ICD-10-CM | POA: Diagnosis not present

## 2019-04-13 DIAGNOSIS — O24419 Gestational diabetes mellitus in pregnancy, unspecified control: Secondary | ICD-10-CM

## 2019-04-13 DIAGNOSIS — Z362 Encounter for other antenatal screening follow-up: Secondary | ICD-10-CM

## 2019-04-13 DIAGNOSIS — O99340 Other mental disorders complicating pregnancy, unspecified trimester: Secondary | ICD-10-CM

## 2019-04-13 DIAGNOSIS — Z3A35 35 weeks gestation of pregnancy: Secondary | ICD-10-CM | POA: Diagnosis not present

## 2019-04-13 DIAGNOSIS — O24410 Gestational diabetes mellitus in pregnancy, diet controlled: Secondary | ICD-10-CM | POA: Diagnosis not present

## 2019-04-13 DIAGNOSIS — O99213 Obesity complicating pregnancy, third trimester: Secondary | ICD-10-CM

## 2019-04-13 DIAGNOSIS — Z8759 Personal history of other complications of pregnancy, childbirth and the puerperium: Secondary | ICD-10-CM

## 2019-04-13 DIAGNOSIS — O34219 Maternal care for unspecified type scar from previous cesarean delivery: Secondary | ICD-10-CM

## 2019-04-14 ENCOUNTER — Encounter: Payer: Medicaid Other | Admitting: Obstetrics and Gynecology

## 2019-04-14 NOTE — Progress Notes (Deleted)
Patient called in and reports she is not able to come to appt today as she has to work. She reports she is not able to come to afternoon appointments. Message to front office to call patient to reschedule. Dr. Alysia Penna notified.

## 2019-04-17 ENCOUNTER — Encounter: Payer: Self-pay | Admitting: Family Medicine

## 2019-04-20 ENCOUNTER — Encounter: Payer: Medicaid Other | Admitting: Obstetrics & Gynecology

## 2019-04-20 ENCOUNTER — Other Ambulatory Visit: Payer: Self-pay

## 2019-04-20 ENCOUNTER — Ambulatory Visit (HOSPITAL_COMMUNITY): Payer: Medicaid Other | Admitting: *Deleted

## 2019-04-20 ENCOUNTER — Ambulatory Visit (HOSPITAL_BASED_OUTPATIENT_CLINIC_OR_DEPARTMENT_OTHER)
Admission: RE | Admit: 2019-04-20 | Discharge: 2019-04-20 | Disposition: A | Discharge status: Home / Self Care | Payer: Medicaid Other | Source: Ambulatory Visit | Attending: Obstetrics and Gynecology | Admitting: Obstetrics and Gynecology

## 2019-04-20 ENCOUNTER — Encounter (HOSPITAL_COMMUNITY): Payer: Self-pay

## 2019-04-20 DIAGNOSIS — Z3A36 36 weeks gestation of pregnancy: Secondary | ICD-10-CM

## 2019-04-20 DIAGNOSIS — O0990 Supervision of high risk pregnancy, unspecified, unspecified trimester: Secondary | ICD-10-CM

## 2019-04-20 DIAGNOSIS — O09293 Supervision of pregnancy with other poor reproductive or obstetric history, third trimester: Secondary | ICD-10-CM

## 2019-04-20 DIAGNOSIS — Z148 Genetic carrier of other disease: Secondary | ICD-10-CM | POA: Diagnosis not present

## 2019-04-20 DIAGNOSIS — O99210 Obesity complicating pregnancy, unspecified trimester: Secondary | ICD-10-CM

## 2019-04-20 DIAGNOSIS — O34219 Maternal care for unspecified type scar from previous cesarean delivery: Secondary | ICD-10-CM | POA: Diagnosis not present

## 2019-04-20 DIAGNOSIS — O99213 Obesity complicating pregnancy, third trimester: Secondary | ICD-10-CM

## 2019-04-20 DIAGNOSIS — O24410 Gestational diabetes mellitus in pregnancy, diet controlled: Secondary | ICD-10-CM | POA: Insufficient documentation

## 2019-04-20 DIAGNOSIS — Z8759 Personal history of other complications of pregnancy, childbirth and the puerperium: Secondary | ICD-10-CM

## 2019-04-20 DIAGNOSIS — O99343 Other mental disorders complicating pregnancy, third trimester: Secondary | ICD-10-CM

## 2019-04-21 ENCOUNTER — Ambulatory Visit (INDEPENDENT_AMBULATORY_CARE_PROVIDER_SITE_OTHER): Payer: Medicaid Other | Admitting: Obstetrics and Gynecology

## 2019-04-21 ENCOUNTER — Inpatient Hospital Stay (HOSPITAL_COMMUNITY)
Admission: AD | Admit: 2019-04-21 | Discharge: 2019-04-24 | DRG: 798 | Disposition: A | Discharge status: Home / Self Care | Payer: Medicaid Other | Attending: Obstetrics & Gynecology | Admitting: Obstetrics & Gynecology

## 2019-04-21 ENCOUNTER — Encounter: Payer: Self-pay | Admitting: Obstetrics and Gynecology

## 2019-04-21 ENCOUNTER — Other Ambulatory Visit (HOSPITAL_COMMUNITY)
Admission: RE | Admit: 2019-04-21 | Discharge: 2019-04-21 | Disposition: A | Discharge status: Home / Self Care | Payer: Medicaid Other | Source: Ambulatory Visit | Attending: Obstetrics and Gynecology | Admitting: Obstetrics and Gynecology

## 2019-04-21 ENCOUNTER — Encounter (HOSPITAL_COMMUNITY): Payer: Self-pay | Admitting: Obstetrics and Gynecology

## 2019-04-21 ENCOUNTER — Other Ambulatory Visit: Payer: Self-pay

## 2019-04-21 VITALS — BP 115/82 | HR 108

## 2019-04-21 DIAGNOSIS — O24429 Gestational diabetes mellitus in childbirth, unspecified control: Secondary | ICD-10-CM | POA: Diagnosis not present

## 2019-04-21 DIAGNOSIS — O34219 Maternal care for unspecified type scar from previous cesarean delivery: Secondary | ICD-10-CM

## 2019-04-21 DIAGNOSIS — Z8632 Personal history of gestational diabetes: Secondary | ICD-10-CM | POA: Diagnosis present

## 2019-04-21 DIAGNOSIS — O9902 Anemia complicating childbirth: Secondary | ICD-10-CM | POA: Diagnosis present

## 2019-04-21 DIAGNOSIS — Z3009 Encounter for other general counseling and advice on contraception: Secondary | ICD-10-CM | POA: Diagnosis present

## 2019-04-21 DIAGNOSIS — Z3490 Encounter for supervision of normal pregnancy, unspecified, unspecified trimester: Secondary | ICD-10-CM | POA: Diagnosis present

## 2019-04-21 DIAGNOSIS — O1410 Severe pre-eclampsia, unspecified trimester: Secondary | ICD-10-CM | POA: Diagnosis not present

## 2019-04-21 DIAGNOSIS — O0990 Supervision of high risk pregnancy, unspecified, unspecified trimester: Secondary | ICD-10-CM

## 2019-04-21 DIAGNOSIS — Z87891 Personal history of nicotine dependence: Secondary | ICD-10-CM

## 2019-04-21 DIAGNOSIS — E669 Obesity, unspecified: Secondary | ICD-10-CM

## 2019-04-21 DIAGNOSIS — Z98891 History of uterine scar from previous surgery: Secondary | ICD-10-CM

## 2019-04-21 DIAGNOSIS — Z20822 Contact with and (suspected) exposure to covid-19: Secondary | ICD-10-CM | POA: Diagnosis present

## 2019-04-21 DIAGNOSIS — O134 Gestational [pregnancy-induced] hypertension without significant proteinuria, complicating childbirth: Secondary | ICD-10-CM | POA: Diagnosis present

## 2019-04-21 DIAGNOSIS — O1413 Severe pre-eclampsia, third trimester: Secondary | ICD-10-CM

## 2019-04-21 DIAGNOSIS — Z3A36 36 weeks gestation of pregnancy: Secondary | ICD-10-CM

## 2019-04-21 DIAGNOSIS — O24419 Gestational diabetes mellitus in pregnancy, unspecified control: Secondary | ICD-10-CM | POA: Diagnosis present

## 2019-04-21 DIAGNOSIS — D563 Thalassemia minor: Secondary | ICD-10-CM | POA: Diagnosis present

## 2019-04-21 DIAGNOSIS — L732 Hidradenitis suppurativa: Secondary | ICD-10-CM | POA: Diagnosis present

## 2019-04-21 DIAGNOSIS — O1404 Mild to moderate pre-eclampsia, complicating childbirth: Secondary | ICD-10-CM | POA: Diagnosis not present

## 2019-04-21 DIAGNOSIS — Z3689 Encounter for other specified antenatal screening: Secondary | ICD-10-CM

## 2019-04-21 DIAGNOSIS — D509 Iron deficiency anemia, unspecified: Secondary | ICD-10-CM | POA: Diagnosis present

## 2019-04-21 DIAGNOSIS — O24420 Gestational diabetes mellitus in childbirth, diet controlled: Secondary | ICD-10-CM | POA: Diagnosis present

## 2019-04-21 DIAGNOSIS — O99214 Obesity complicating childbirth: Secondary | ICD-10-CM | POA: Diagnosis present

## 2019-04-21 DIAGNOSIS — O24410 Gestational diabetes mellitus in pregnancy, diet controlled: Secondary | ICD-10-CM

## 2019-04-21 DIAGNOSIS — O0991 Supervision of high risk pregnancy, unspecified, first trimester: Secondary | ICD-10-CM

## 2019-04-21 DIAGNOSIS — O99210 Obesity complicating pregnancy, unspecified trimester: Secondary | ICD-10-CM

## 2019-04-21 DIAGNOSIS — O99213 Obesity complicating pregnancy, third trimester: Secondary | ICD-10-CM

## 2019-04-21 DIAGNOSIS — O99013 Anemia complicating pregnancy, third trimester: Secondary | ICD-10-CM | POA: Diagnosis present

## 2019-04-21 DIAGNOSIS — O1414 Severe pre-eclampsia complicating childbirth: Secondary | ICD-10-CM | POA: Diagnosis not present

## 2019-04-21 DIAGNOSIS — Z8759 Personal history of other complications of pregnancy, childbirth and the puerperium: Secondary | ICD-10-CM

## 2019-04-21 LAB — COMPREHENSIVE METABOLIC PANEL
ALT: 15 U/L (ref 0–44)
AST: 21 U/L (ref 15–41)
Albumin: 2.7 g/dL — ABNORMAL LOW (ref 3.5–5.0)
Alkaline Phosphatase: 83 U/L (ref 38–126)
Anion gap: 9 (ref 5–15)
BUN: 6 mg/dL (ref 6–20)
CO2: 22 mmol/L (ref 22–32)
Calcium: 8.9 mg/dL (ref 8.9–10.3)
Chloride: 105 mmol/L (ref 98–111)
Creatinine, Ser: 0.49 mg/dL (ref 0.44–1.00)
GFR calc Af Amer: >60 mL/min (ref >60)
GFR calc non Af Amer: >60 mL/min (ref >60)
Glucose, Bld: 99 mg/dL (ref 70–99)
Potassium: 4.1 mmol/L (ref 3.5–5.1)
Sodium: 136 mmol/L (ref 135–145)
Total Bilirubin: 0.3 mg/dL (ref 0.3–1.2)
Total Protein: 6.5 g/dL (ref 6.5–8.1)

## 2019-04-21 LAB — CBC
HCT: 33.4 % — ABNORMAL LOW (ref 36.0–46.0)
Hemoglobin: 10.7 g/dL — ABNORMAL LOW (ref 12.0–15.0)
MCH: 27 pg (ref 26.0–34.0)
MCHC: 32 g/dL (ref 30.0–36.0)
MCV: 84.3 fL (ref 80.0–100.0)
Platelets: 274 10*3/uL (ref 150–400)
RBC: 3.96 MIL/uL (ref 3.87–5.11)
RDW: 13.9 % (ref 11.5–15.5)
WBC: 9.9 10*3/uL (ref 4.0–10.5)
nRBC: 0 % (ref 0.0–0.2)

## 2019-04-21 LAB — POCT URINALYSIS DIP (DEVICE)
Bilirubin Urine: NEGATIVE
Glucose, UA: NEGATIVE mg/dL
Ketones, ur: NEGATIVE mg/dL
Leukocytes,Ua: NEGATIVE
Nitrite: NEGATIVE
Protein, ur: NEGATIVE mg/dL
Specific Gravity, Urine: 1.025 (ref 1.005–1.030)
Urobilinogen, UA: 0.2 mg/dL (ref 0.0–1.0)
pH: 6.5 (ref 5.0–8.0)

## 2019-04-21 LAB — ABO/RH: ABO/RH(D): A POS

## 2019-04-21 LAB — PROTEIN / CREATININE RATIO, URINE
Creatinine, Urine: 193.14 mg/dL
Protein Creatinine Ratio: 0.09 mg/mg{Cre} (ref 0.00–0.15)
Total Protein, Urine: 18 mg/dL

## 2019-04-21 LAB — TYPE AND SCREEN
ABO/RH(D): A POS
Antibody Screen: NEGATIVE

## 2019-04-21 LAB — SARS CORONAVIRUS 2 (TAT 6-24 HRS): SARS Coronavirus 2: NEGATIVE

## 2019-04-21 LAB — GLUCOSE, CAPILLARY: Glucose-Capillary: 94 mg/dL (ref 70–99)

## 2019-04-21 MED ORDER — SODIUM CHLORIDE 0.9 % IV SOLN
5.0000 10*6.[IU] | Freq: Once | INTRAVENOUS | Status: AC
  Administered 2019-04-21: 20:00:00 5 10*6.[IU] via INTRAVENOUS
  Filled 2019-04-21: qty 5

## 2019-04-21 MED ORDER — MAGNESIUM SULFATE BOLUS VIA INFUSION
4.0000 g | Freq: Once | INTRAVENOUS | Status: AC
  Administered 2019-04-21: 21:00:00 4 g via INTRAVENOUS
  Filled 2019-04-21: qty 1000

## 2019-04-21 MED ORDER — SOD CITRATE-CITRIC ACID 500-334 MG/5ML PO SOLN
30.0000 mL | ORAL | Status: DC | PRN
  Administered 2019-04-22: 30 mL via ORAL
  Filled 2019-04-21: qty 30

## 2019-04-21 MED ORDER — BETAMETHASONE SOD PHOS & ACET 6 (3-3) MG/ML IJ SUSP
12.0000 mg | INTRAMUSCULAR | Status: AC
  Administered 2019-04-21 – 2019-04-22 (×2): 12 mg via INTRAMUSCULAR
  Filled 2019-04-21 (×2): qty 5

## 2019-04-21 MED ORDER — ACETAMINOPHEN 325 MG PO TABS
650.0000 mg | ORAL_TABLET | ORAL | Status: DC | PRN
  Administered 2019-04-22 (×2): 650 mg via ORAL
  Filled 2019-04-21 (×2): qty 2

## 2019-04-21 MED ORDER — MAGNESIUM SULFATE 40 GM/1000ML IV SOLN
2.0000 g/h | INTRAVENOUS | Status: DC
  Administered 2019-04-22: 2 g/h via INTRAVENOUS
  Filled 2019-04-21 (×2): qty 1000

## 2019-04-21 MED ORDER — CYCLOBENZAPRINE HCL 5 MG PO TABS
10.0000 mg | ORAL_TABLET | Freq: Once | ORAL | Status: AC
  Administered 2019-04-21: 10 mg via ORAL
  Filled 2019-04-21: qty 2

## 2019-04-21 MED ORDER — LACTATED RINGERS IV SOLN: INTRAVENOUS | Status: DC

## 2019-04-21 MED ORDER — OXYTOCIN 40 UNITS IN NORMAL SALINE INFUSION - SIMPLE MED
2.5000 [IU]/h | INTRAVENOUS | Status: DC
  Filled 2019-04-21: qty 1000

## 2019-04-21 MED ORDER — LIDOCAINE HCL (PF) 1 % IJ SOLN: 30.0000 mL | INTRAMUSCULAR | Status: DC | PRN

## 2019-04-21 MED ORDER — ONDANSETRON HCL 4 MG/2ML IJ SOLN: 4.0000 mg | Freq: Four times a day (QID) | INTRAMUSCULAR | Status: DC | PRN

## 2019-04-21 MED ORDER — LACTATED RINGERS IV SOLN: 500.0000 mL | INTRAVENOUS | Status: DC | PRN

## 2019-04-21 MED ORDER — OXYTOCIN BOLUS FROM INFUSION
500.0000 mL | Freq: Once | INTRAVENOUS | Status: AC
  Administered 2019-04-22: 22:00:00 500 mL via INTRAVENOUS

## 2019-04-21 MED ORDER — FENTANYL CITRATE (PF) 100 MCG/2ML IJ SOLN: 50.0000 ug | INTRAMUSCULAR | Status: DC | PRN

## 2019-04-21 MED ORDER — PENICILLIN G POT IN DEXTROSE 60000 UNIT/ML IV SOLN
3.0000 10*6.[IU] | INTRAVENOUS | Status: DC
  Administered 2019-04-22 (×6): 3 10*6.[IU] via INTRAVENOUS
  Filled 2019-04-21 (×6): qty 50

## 2019-04-21 MED ORDER — LABETALOL HCL 5 MG/ML IV SOLN: 20.0000 mg | INTRAVENOUS | Status: DC | PRN

## 2019-04-21 MED ORDER — HYDRALAZINE HCL 20 MG/ML IJ SOLN: 10.0000 mg | INTRAMUSCULAR | Status: DC | PRN

## 2019-04-21 MED ORDER — LABETALOL HCL 5 MG/ML IV SOLN: 40.0000 mg | INTRAVENOUS | Status: DC | PRN

## 2019-04-21 MED ORDER — LABETALOL HCL 5 MG/ML IV SOLN: 80.0000 mg | INTRAVENOUS | Status: DC | PRN

## 2019-04-21 NOTE — MAU Provider Note (Addendum)
History     CSN: [id number]  Arrival date and time: 04/21/19 1527   First Provider Initiated Contact with Patient 04/21/19 1618      Chief Complaint  Patient presents with  . Hypertension   Ms. Denise Gillespie is a 29 y.o. [id number] at 39w2dwho presents to MAU for preeclampsia evaluation after she was seen in the office today with elevated blood pressure and a HA.  Pt reports HA today rating as 5/10. Pt reports increase in frequency of HA in pregnancy around 2 months ago. Pt denies any dx of headache issues. Pt does endorse blurry vision occasionally with headaches, but is not present at this time. Pt also endorses nausea which started around time of headache onset.  Pt denies seeing spots, vomiting, epigastric pain, swelling in face and hands, sudden weight gain. Pt denies chest pain and SOB.  Pt denies constipation, diarrhea, or urinary problems. Pt denies fever, chills, fatigue, sweating or changes in appetite. Pt denies dizziness, light-headedness, weakness.  Pt denies VB, ctx, LOF and reports good FM.  Current pregnancy problems? GDM, hx PIH/preeclampsia, anemia Blood Type? A Positive Allergies? NKDA Current medications? PNVs, iron, Protonix PRN, Flexeril PRN Current PNC & next appt? [facility], Tuesday 04/26/2019   OB History    Gravida  3   Para  2   Term  2   Preterm      AB      Living  2     SAB      TAB      Ectopic      Multiple      Live Births  2           Past Medical History:  Diagnosis Date  . Abscess    multiple  . Anemia, iron deficiency   . Depression   . Gestational diabetes   . Hidradenitis   . Obesity   . Pregnancy induced hypertension     Past Surgical History:  Procedure Laterality Date  . CESAREAN SECTION    . INCISE AND DRAIN ABCESS      Family History  Problem Relation Age of Onset  . Hypertension Mother   . Migraines Mother   . Eczema Brother     Social History   Tobacco Use  . Smoking status: Former  Smoker    Packs/day: 0.25    Types: Cigarettes    Quit date: 09/18/2018    Years since quitting: 0.5  . Smokeless tobacco: Never Used  . Tobacco comment: quit when found out pregnant   Substance Use Topics  . Alcohol use: Not Currently    Comment: Social  . Drug use: No    Allergies: No Known Allergies  Medications Prior to Admission  Medication Sig Dispense Refill Last Dose  . Accu-Chek Softclix Lancets lancets Use as instructed 4 times daily 100 each 12 04/21/2019 at Unknown time  . Blood Glucose Monitoring Suppl (ACCU-CHEK GUIDE) w/Device KIT 1 each by Does not apply route in the morning, at noon, in the evening, and at bedtime. 1 kit 0 04/21/2019 at Unknown time  . cyclobenzaprine (FLEXERIL) 10 MG tablet Take 1 tablet (10 mg total) by mouth at bedtime as needed for muscle spasms. 30 tablet 0 Past Week at Unknown time  . glucose blood test strip Use as instructed 4 times daily 100 each 12 04/21/2019 at Unknown time  . iron polysaccharides (NIFEREX) 150 MG capsule Take 1 capsule (150 mg total) by mouth daily. 30 capsule 2  04/21/2019 at Unknown time  . pantoprazole (PROTONIX) 20 MG tablet Take 1 tablet (20 mg total) by mouth daily. 30 tablet 1 Past Month at Unknown time  . Prenatal Vit-Fe Fumarate-FA (MULTIVITAMIN-PRENATAL) 27-0.8 MG TABS tablet Take 1 tablet by mouth daily at 12 noon. 30 tablet 9 04/21/2019 at Unknown time    Review of Systems  Constitutional: Negative for chills, diaphoresis, fatigue and fever.  Eyes: Negative for visual disturbance.  Respiratory: Negative for shortness of breath.   Cardiovascular: Negative for chest pain.  Gastrointestinal: Negative for abdominal pain, constipation, diarrhea, nausea and vomiting.  Genitourinary: Negative for dysuria, flank pain, frequency, pelvic pain, urgency, vaginal bleeding and vaginal discharge.  Neurological: Positive for headaches. Negative for dizziness, weakness and light-headedness.   Physical Exam   Blood pressure 120/81,  pulse 76, temperature 98.7 F (37.1 C), resp. rate 16, last menstrual period 08/10/2018, SpO2 99 %.  Patient Vitals for the past 24 hrs:  BP Temp Pulse Resp SpO2  04/21/19 1902 120/81 -- 76 -- --  04/21/19 1832 (!) 116/93 -- 83 -- --  04/21/19 1817 (!) 143/64 -- (!) 114 -- --  04/21/19 1802 135/86 -- (!) 103 -- --  04/21/19 1746 (!) 144/103 -- (!) 101 -- --  04/21/19 1732 128/75 -- 94 -- --  04/21/19 1716 130/79 -- (!) 101 -- --  04/21/19 1702 133/82 -- (!) 110 -- --  04/21/19 1646 138/87 -- (!) 114 -- --  04/21/19 1631 (!) 152/95 -- (!) 113 -- --  04/21/19 1616 (!) 151/104 -- (!) 108 -- --  04/21/19 1601 (!) 143/101 -- (!) 119 -- --  04/21/19 1547 (!) 145/99 98.7 F (37.1 C) (!) 114 16 99 %   Physical Exam  Constitutional: She is oriented to person, place, and time. She appears well-developed and well-nourished. No distress.  HENT:  Head: Normocephalic and atraumatic.  Respiratory: Effort normal.  Neurological: She is alert and oriented to person, place, and time.  Skin: She is not diaphoretic.  Psychiatric: She has a normal mood and affect. Her behavior is normal. Judgment and thought content normal.   Results for orders placed or performed during the hospital encounter of 04/21/19 (from the past 24 hour(s))  CBC     Status: Abnormal   Collection Time: 04/21/19  4:14 PM  Result Value Ref Range   WBC 9.9 4.0 - 10.5 K/uL   RBC 3.96 3.87 - 5.11 MIL/uL   Hemoglobin 10.7 (L) 12.0 - 15.0 g/dL   HCT 33.4 (L) 36.0 - 46.0 %   MCV 84.3 80.0 - 100.0 fL   MCH 27.0 26.0 - 34.0 pg   MCHC 32.0 30.0 - 36.0 g/dL   RDW 13.9 11.5 - 15.5 %   Platelets 274 150 - 400 K/uL   nRBC 0.0 0.0 - 0.2 %  Comprehensive metabolic panel     Status: Abnormal   Collection Time: 04/21/19  4:14 PM  Result Value Ref Range   Sodium 136 135 - 145 mmol/L   Potassium 4.1 3.5 - 5.1 mmol/L   Chloride 105 98 - 111 mmol/L   CO2 22 22 - 32 mmol/L   Glucose, Bld 99 70 - 99 mg/dL   BUN 6 6 - 20 mg/dL    Creatinine, Ser 0.49 0.44 - 1.00 mg/dL   Calcium 8.9 8.9 - 10.3 mg/dL   Total Protein 6.5 6.5 - 8.1 g/dL   Albumin 2.7 (L) 3.5 - 5.0 g/dL   AST 21 15 - 41 U/L  ALT 15 0 - 44 U/L   Alkaline Phosphatase 83 38 - 126 U/L   Total Bilirubin 0.3 0.3 - 1.2 mg/dL   GFR calc non Af Amer >60 >60 mL/min   GFR calc Af Amer >60 >60 mL/min   Anion gap 9 5 - 15  Protein / creatinine ratio, urine     Status: None   Collection Time: 04/21/19  5:03 PM  Result Value Ref Range   Creatinine, Urine 193.14 mg/dL   Total Protein, Urine 18 mg/dL   Protein Creatinine Ratio 0.09 0.00 - 0.15 mg/mg[Cre]   MAU Course  Procedures  MDM -preeclampsia evaluation with no severe range BP in MAU on admission -elevated BP of 151/104 at highest -symptoms include: HA, blurry vision, nausea; Flexeril given -UA: trace hgb (from clinic UA) -CBC: H/H 10.7/33.4, platelets 274 -CMP: serum creatinine 0.49, AST/ALT 21/15 -PCr: 0.09 -EFM: reactive       -baseline: 145/150       -variability: moderate       -accels: present, 15x15       -decels: absent       -TOCO: irritability -after medication administration, pt reports HA now mostly unchanged, rates as "maybe 4/10" with persistent visual disturbance -consulted with Dr. Kennon Rounds, pt will be admitted for severe preeclampsia -report called to first call L&D -Pt informed that the ultrasound is considered a limited OB ultrasound and is not intended to be a complete ultrasound exam.  Patient also informed that the ultrasound is not being completed with the intent of assessing for fetal or placental anomalies or any pelvic abnormalities.  Explained that the purpose of today's ultrasound is to assess for  presentation (VTX).  Patient acknowledges the purpose of the exam and the limitations of the study.   -admit for induction  Orders Placed This Encounter  Procedures  . SARS CORONAVIRUS 2 (TAT 6-24 HRS) Nasopharyngeal Nasopharyngeal Swab    Standing Status:   Standing    Number  of Occurrences:   1    Order Specific Question:   Is this test for diagnosis or screening    Answer:   Screening    Order Specific Question:   Symptomatic for COVID-19 as defined by CDC    Answer:   No    Order Specific Question:   Hospitalized for COVID-19    Answer:   No    Order Specific Question:   Admitted to ICU for COVID-19    Answer:   No    Order Specific Question:   Previously tested for COVID-19    Answer:   Yes    Order Specific Question:   Resident in a congregate (group) care setting    Answer:   Unknown    Order Specific Question:   Employed in healthcare setting    Answer:   Unknown    Order Specific Question:   Pregnant    Answer:   Yes  . CBC    Standing Status:   Standing    Number of Occurrences:   1  . Comprehensive metabolic panel    Standing Status:   Standing    Number of Occurrences:   1  . Protein / creatinine ratio, urine    Standing Status:   Standing    Number of Occurrences:   1   Meds ordered this encounter  Medications  . cyclobenzaprine (FLEXERIL) tablet 10 mg   Assessment and Plan   1. Preeclampsia, severe, third trimester   2. [redacted] weeks  gestation of pregnancy   3. NST (non-stress test) reactive    -admit to L&D for delivery  Gerrie Nordmann Yahsir Wickens 04/21/2019, 7:46 PM

## 2019-04-21 NOTE — H&P (Signed)
OBSTETRIC ADMISSION HISTORY AND PHYSICAL  Denise Gillespie is a 29 y.o. female G81P2002 with IUP at 31w2dby LMP presenting for IOL due to Pre-eclampsia with severe features. She reports +FMs, No LOF, no VB, or peripheral edema, and RUQ pain. She reports blurry vision, headache, nausea, vomiting, dizziness, and photophobia for the past 4-6 weeks. She also reports dark, foamy urine but no dysuria or flank pain. She plans on breast feeding. She requests BTL for birth control, papers signed 03/24/2019. 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 Abscess    multiple  . Anemia, iron deficiency   . Depression   . Gestational diabetes   . Hidradenitis   . Obesity   . Pregnancy induced hypertension     Past Surgical History: Past Surgical History:  Procedure Laterality Date  . CESAREAN SECTION    . INCISE AND DRAIN ABCESS      Obstetrical History: OB History    Gravida  3   Para  2   Term  2   Preterm      AB      Living  2     SAB      TAB      Ectopic      Multiple      Live Births  2           Social History Social History   Socioeconomic History  . Marital status: Single    Spouse name: Not on file  . Number of children: Not on file  . Years of education: Not on file  . Highest education level: Not on file  Occupational History  . Not on file  Tobacco Use  . Smoking status: Former Smoker    Packs/day: 0.25    Types: Cigarettes    Quit date: 09/18/2018    Years since quitting: 0.5  . Smokeless tobacco: Never Used  . Tobacco comment: quit when  found out pregnant   Substance and Sexual Activity  . Alcohol use: Not Currently    Comment: Social  . Drug use: No  . Sexual activity: Yes    Birth control/protection: None  Other Topics Concern  . Not on file  Social History Narrative  . Not on file   Social Determinants of Health   Financial Resource Strain:   . Difficulty of Paying Living Expenses:   Food Insecurity: No Food Insecurity  . Worried About RCharity fundraiserin the Last Year: Never true  . Ran Out of Food in the Last Year: Never true  Transportation Needs: No Transportation Needs  . Lack of Transportation (Medical): No  . Lack of Transportation (Non-Medical): No  Physical Activity:   . Days of Exercise per Week:   . Minutes of Exercise per Session:   Stress:   . Feeling of Stress :   Social Connections:   . Frequency of Communication with Friends and Family:   . Frequency of Social Gatherings with Friends and Family:   . Attends Religious Services:   . Active Member of Clubs or Organizations:   . Attends CArchivistMeetings:   .  Marital Status:     Family History: Family History  Problem Relation Age of Onset  . Hypertension Mother   . Migraines Mother   . Eczema Brother     Allergies: No Known Allergies  Medications Prior to Admission  Medication Sig Dispense Refill Last Dose  . Accu-Chek Softclix Lancets lancets Use as instructed 4 times daily 100 each 12 04/21/2019 at Unknown time  . Blood Glucose Monitoring Suppl (ACCU-CHEK GUIDE) w/Device KIT 1 each by Does not apply route in the morning, at noon, in the evening, and at bedtime. 1 kit 0 04/21/2019 at Unknown time  . cyclobenzaprine (FLEXERIL) 10 MG tablet Take 1 tablet (10 mg total) by mouth at bedtime as needed for muscle spasms. 30 tablet 0 Past Week at Unknown time  . glucose blood test strip Use as instructed 4 times daily 100 each 12 04/21/2019 at Unknown time  . iron polysaccharides (NIFEREX) 150 MG capsule Take 1 capsule (150 mg  total) by mouth daily. 30 capsule 2 04/21/2019 at Unknown time  . pantoprazole (PROTONIX) 20 MG tablet Take 1 tablet (20 mg total) by mouth daily. 30 tablet 1 Past Month at Unknown time  . Prenatal Vit-Fe Fumarate-FA (MULTIVITAMIN-PRENATAL) 27-0.8 MG TABS tablet Take 1 tablet by mouth daily at 12 noon. 30 tablet 9 04/21/2019 at Unknown time     Review of Systems   All systems reviewed and negative except as stated in HPI  Blood pressure 120/81, pulse 76, temperature 98.7 F (37.1 C), resp. rate 16, last menstrual period 08/10/2018, SpO2 99 %. General appearance: alert and cooperative Lungs: clear to auscultation bilaterally Heart: regular rate and rhythm Abdomen: soft, non-tender; bowel sounds normal, leopolds 3000 g Extremities: Homans sign is negative, no sign of DVT  Presentation: cephalic Fetal monitoringBaseline: 145 bpm, Variability: Good {> 6 bpm), Accelerations: Reactive and Decelerations: Absent Uterine activity: irregular     Prenatal labs: ABO, Rh: A/Positive/-- (11/17 1613) Antibody: Negative (11/17 1613) Rubella: 1.47 (11/17 1613) RPR: Non Reactive (03/12 0846)  HBsAg: Negative (11/17 1613)  HIV: Non Reactive (03/12 0846)  GBS:   unknown, swab collected GC/CT: neg/neg 03/23/2019 2 hr Glucola: abnormal [phone] Genetic screening: low risk panorama, AFP negative  Anatomy US  WNL   Prenatal Transfer Tool  Maternal Diabetes: Yes:  Diabetes Type:  Diet controlled Genetic Screening: Normal Maternal Ultrasounds/Referrals: Normal Fetal Ultrasounds or other Referrals:  None Maternal Substance Abuse:  Yes:  Type: Smoker Significant Maternal Medications:  None Significant Maternal Lab Results: Other: unknown   Results for orders placed or performed during the hospital encounter of 04/21/19 (from the past 24 hour(s))  CBC   Collection Time: 04/21/19  4:14 PM  Result Value Ref Range   WBC 9.9 4.0 - 10.5 K/uL   RBC 3.96 3.87 - 5.11 MIL/uL   Hemoglobin 10.7 (L) 12.0 -  15.0 g/dL   HCT 33.4 (L) 36.0 - 46.0 %   MCV 84.3 80.0 - 100.0 fL   MCH 27.0 26.0 - 34.0 pg   MCHC 32.0 30.0 - 36.0 g/dL   RDW 13.9 11.5 - 15.5 %   Platelets 274 150 - 400 K/uL   nRBC 0.0 0.0 - 0.2 %  Comprehensive metabolic panel   Collection Time: 04/21/19  4:14 PM  Result Value Ref Range   Sodium 136 135 - 145 mmol/L   Potassium 4.1 3.5 - 5.1 mmol/L   Chloride 105 98 - 111 mmol/L   CO2 22 22 - 32 mmol/L   Glucose, Bld  99 70 - 99 mg/dL   BUN 6 6 - 20 mg/dL   Creatinine, Ser 0.49 0.44 - 1.00 mg/dL   Calcium 8.9 8.9 - 10.3 mg/dL   Total Protein 6.5 6.5 - 8.1 g/dL   Albumin 2.7 (L) 3.5 - 5.0 g/dL   AST 21 15 - 41 U/L   ALT 15 0 - 44 U/L   Alkaline Phosphatase 83 38 - 126 U/L   Total Bilirubin 0.3 0.3 - 1.2 mg/dL   GFR calc non Af Amer >60 >60 mL/min   GFR calc Af Amer >60 >60 mL/min   Anion gap 9 5 - 15  Protein / creatinine ratio, urine   Collection Time: 04/21/19  5:03 PM  Result Value Ref Range   Creatinine, Urine 193.14 mg/dL   Total Protein, Urine 18 mg/dL   Protein Creatinine Ratio 0.09 0.00 - 0.15 mg/mg[Cre]  Results for orders placed or performed in visit on 04/21/19 (from the past 24 hour(s))  POCT urinalysis dip (device)   Collection Time: 04/21/19  8:51 AM  Result Value Ref Range   Glucose, UA NEGATIVE NEGATIVE mg/dL   Bilirubin Urine NEGATIVE NEGATIVE   Ketones, ur NEGATIVE NEGATIVE mg/dL   Specific Gravity, Urine 1.025 1.005 - 1.030   Hgb urine dipstick TRACE (A) NEGATIVE   pH 6.5 5.0 - 8.0   Protein, ur NEGATIVE NEGATIVE mg/dL   Urobilinogen, UA 0.2 0.0 - 1.0 mg/dL   Nitrite NEGATIVE NEGATIVE   Leukocytes,Ua NEGATIVE NEGATIVE    Patient Active Problem List   Diagnosis Date Noted  . Encounter for induction of labor 04/21/2019  . Unwanted fertility 04/07/2019  . Maternal iron deficiency anemia complicating pregnancy in third trimester 03/28/2019  . GDM (gestational diabetes mellitus) 03/27/2019  . Alpha thalassemia trait 12/28/2018  . BMI 45.0-49.9,  adult ([place]) 12/28/2018  . LGSIL on Pap smear of cervix 12/03/2018  . History of VBAC 11/30/2018  . Supervision of high risk pregnancy, antepartum 11/16/2018  . History of severe pre-eclampsia 11/16/2018  . Obesity in pregnancy   . Depression   . Hydradenitis 05/25/2017  . Tobacco use disorder 05/25/2017  . Current severe episode of major depressive disorder without psychotic features ([place]) 05/25/2017  . Generalized anxiety disorder 05/25/2017    Assessment/Plan:  ARYANNAH MOHON is a 29 y.o. G3P2002 at 84w2dhere for IOL due to severe pre-eclampsia by neuro features (headache).   #Labor: Latent labor. Patient agreeable to induction. Patient desires VBAC. Cook's FB placed w 60cc in uterine balloon at 2100, start pitocin once FB out.   #Pre-E with severe features: Patient with headache unresolved with treatment and elevated BP. Start Mg bolus.  #Preterm IOL: give BMZ x2, first dose tonight, watch sugars closely #Pain: Desires epidural #FWB: Cat I #ID:  PCN, GBS unknown  #MOF: breast #MOC: Desires BTL, however only 28 days since signing (03/24/2019) and regardless high BMI is likely barrier to PP BTL, will review at time of delivery.  #Circ:  Yes #GDMA1: CBG q4h latent labor, q2h active labor  BShawna Orleans MS3  04/21/2019, 7:50 PM    OB FELLOW ATTESTATION  I have seen and examined this patient and repeated all portions of the history and physical exam, and I have edited the above documentation in the medical student's note to reflect any changes or updates.  MAugustin Coupe MD/MPH OB Fellow  04/21/2019, 9:23 PM

## 2019-04-21 NOTE — MAU Provider Note (Signed)
History   Denise Gillespie is a 29 y.o. [id number] with a history of severe pre-eclampsia who presents at 5w2dfor workup of possible pre-eclampsia. She was seen outpatient this morning and encouraged to seek MAU care due to elevated BPs. For the past 4-6 weeks she has been having headaches with blurring of vision, nausea, dizziness, and photophobia. She reports that she does not have these symptoms at baseline and has no history of migraines. She ranks her current headache as a 5/10 and endorses blurring of her vision.  She endorses dark and foamy urine, but denies any dysuria or flank pain. She endorses slight LE edema but denies SOB or RUQ pain.    CSN: [id number] Arrival date and time: 04/21/19 1527   First Provider Initiated Contact with Patient 04/21/19 1618      Chief Complaint  Patient presents with  . Hypertension    OB History    Gravida  3   Para  2   Term  2   Preterm      AB      Living  2     SAB      TAB      Ectopic      Multiple      Live Births  2           Past Medical History:  Diagnosis Date  . Abscess    multiple  . Anemia, iron deficiency   . Depression   . Gestational diabetes   . Hidradenitis   . Obesity   . Pregnancy induced hypertension     Past Surgical History:  Procedure Laterality Date  . CESAREAN SECTION    . INCISE AND DRAIN ABCESS      Family History  Problem Relation Age of Onset  . Hypertension Mother   . Migraines Mother   . Eczema Brother     Social History   Tobacco Use  . Smoking status: Former Smoker    Packs/day: 0.25    Types: Cigarettes    Quit date: 09/18/2018    Years since quitting: 0.5  . Smokeless tobacco: Never Used  . Tobacco comment: quit when found out pregnant   Substance Use Topics  . Alcohol use: Not Currently    Comment: Social  . Drug use: No    Allergies: No Known Allergies  Medications Prior to Admission  Medication Sig Dispense Refill Last Dose  . Accu-Chek Softclix  Lancets lancets Use as instructed 4 times daily 100 each 12 04/21/2019 at Unknown time  . Blood Glucose Monitoring Suppl (ACCU-CHEK GUIDE) w/Device KIT 1 each by Does not apply route in the morning, at noon, in the evening, and at bedtime. 1 kit 0 04/21/2019 at Unknown time  . cyclobenzaprine (FLEXERIL) 10 MG tablet Take 1 tablet (10 mg total) by mouth at bedtime as needed for muscle spasms. 30 tablet 0 Past Week at Unknown time  . glucose blood test strip Use as instructed 4 times daily 100 each 12 04/21/2019 at Unknown time  . iron polysaccharides (NIFEREX) 150 MG capsule Take 1 capsule (150 mg total) by mouth daily. 30 capsule 2 04/21/2019 at Unknown time  . pantoprazole (PROTONIX) 20 MG tablet Take 1 tablet (20 mg total) by mouth daily. 30 tablet 1 Past Month at Unknown time  . Prenatal Vit-Fe Fumarate-FA (MULTIVITAMIN-PRENATAL) 27-0.8 MG TABS tablet Take 1 tablet by mouth daily at 12 noon. 30 tablet 9 04/21/2019 at Unknown time    Review of Systems  Constitutional: Negative for fever.  HENT: Negative.   Eyes: Positive for visual disturbance.  Respiratory: Negative for shortness of breath.   Cardiovascular: Negative for chest pain.  Gastrointestinal: Positive for nausea. Negative for abdominal pain and vomiting.  Endocrine: Negative.   Genitourinary: Negative for flank pain, vaginal bleeding, vaginal discharge and vaginal pain.  Musculoskeletal: Negative.   Skin: Negative.   Allergic/Immunologic: Negative.   Neurological: Positive for dizziness. Negative for tremors.  Hematological: Negative.   Psychiatric/Behavioral: Negative.    Physical Exam   Blood pressure 138/87, pulse (!) 114, temperature 98.7 F (37.1 C), resp. rate 16, last menstrual period 08/10/2018, SpO2 99 %.  Physical Exam  Constitutional: She appears well-developed and well-nourished.  GI: Soft. She exhibits no distension. There is no abdominal tenderness.  Musculoskeletal:        General: Edema (+1 pretibial edema BL)  present.  Skin: Skin is warm and dry.    MAU Course  Procedures   MDM Lab Tests Collected UPC, CMet, CBC Flexeril administered for HA  RECHECK at 1845 Headache still 4/10 with visual disturbance present, worse in L eye  Assessment and Plan  Concern for pre-eclampsia: Constellation of symptoms concerning for severe pre-eclampsia.  Plan: Admit to L&D for induction of labor  [name] 04/21/2019, 5:10 PM

## 2019-04-21 NOTE — Progress Notes (Signed)
   PRENATAL VISIT NOTE  Subjective:  Denise Gillespie is a 29 y.o. G3P2002 at [redacted]w[redacted]d being seen today for ongoing prenatal care.  She is currently monitored for the following issues for this high-risk pregnancy and has Hydradenitis; Tobacco use disorder; Current severe episode of major depressive disorder without psychotic features (HCC); Generalized anxiety disorder; Supervision of high risk pregnancy, antepartum; History of severe pre-eclampsia; Obesity in pregnancy; Depression; History of VBAC; LGSIL on Pap smear of cervix; Alpha thalassemia trait; BMI 45.0-49.9, adult (HCC); GDM (gestational diabetes mellitus); Maternal iron deficiency anemia complicating pregnancy in third trimester; and Unwanted fertility on their problem list.  Patient reports headache.  Contractions: Irritability. Vag. Bleeding: None.  Movement: Present. Denies leaking of fluid.   The following portions of the patient's history were reviewed and updated as appropriate: allergies, current medications, past family history, past medical history, past social history, past surgical history and problem list.   Objective:   Vitals:   04/21/19 0824 04/21/19 0833  BP: (!) 145/103 115/82  Pulse:  (!) 108    Fetal Status: Fetal Heart Rate (bpm): 150 Fundal Height: 36 cm Movement: Present     General:  Alert, oriented and cooperative. Patient is in no acute distress.  Skin: Skin is warm and dry. No rash noted.   Cardiovascular: Normal heart rate noted  Respiratory: Normal respiratory effort, no problems with respiration noted  Abdomen: Soft, gravid, appropriate for gestational age.  Pain/Pressure: Absent     Pelvic: Cervical exam performed in the presence of a chaperone Dilation: Closed Effacement (%): Thick Station: Ballotable  Extremities: Normal range of motion.  Edema: Trace  Mental Status: Normal mood and affect. Normal behavior. Normal judgment and thought content.   Assessment and Plan:  Pregnancy: G3P2002 at  [redacted]w[redacted]d 1. Supervision of high risk pregnancy, antepartum Patient with elevated BP today with a HA. Patient has not taken tylenol as she states it doesn't help. Patient sent to MAU for further evaluaton - Culture, beta strep (group b only) - Cervicovaginal ancillary only( [place])  2. Diet controlled gestational diabetes mellitus (GDM) in third trimester CBGs reviewed and great majority within range with a few elevated values to to dietary indiscretions Continue diet control  3. History of VBAC Desires another TOLAC Previous c-section for breech presentation  4. History of severe pre-eclampsia See above  5. Obesity in pregnancy Continue ASA  Preterm labor symptoms and general obstetric precautions including but not limited to vaginal bleeding, contractions, leaking of fluid and fetal movement were reviewed in detail with the patient. Please refer to After Visit Summary for other counseling recommendations.   Return in about 1 week (around 04/28/2019) for in person, ROB, High risk.  Future Appointments  Date Time Provider Department Center  04/27/2019 12:30 PM [facility] NURSE [facility]  04/27/2019 12:30 PM [facility] [place] [facility]    Catalina Antigua, MD

## 2019-04-21 NOTE — MAU Note (Signed)
Pt states she was told today that she has pre-e.   Pt was told to come to MAU for further evaluation.    Denies vaginal bleeding, or LOF   Reports +FM

## 2019-04-22 ENCOUNTER — Inpatient Hospital Stay (HOSPITAL_COMMUNITY): Payer: Medicaid Other | Admitting: Anesthesiology

## 2019-04-22 DIAGNOSIS — D563 Thalassemia minor: Secondary | ICD-10-CM

## 2019-04-22 DIAGNOSIS — O1404 Mild to moderate pre-eclampsia, complicating childbirth: Secondary | ICD-10-CM

## 2019-04-22 DIAGNOSIS — Z3A36 36 weeks gestation of pregnancy: Secondary | ICD-10-CM

## 2019-04-22 DIAGNOSIS — O1414 Severe pre-eclampsia complicating childbirth: Principal | ICD-10-CM

## 2019-04-22 DIAGNOSIS — O24420 Gestational diabetes mellitus in childbirth, diet controlled: Secondary | ICD-10-CM

## 2019-04-22 DIAGNOSIS — O34219 Maternal care for unspecified type scar from previous cesarean delivery: Secondary | ICD-10-CM

## 2019-04-22 LAB — CBC
HCT: 34.6 % — ABNORMAL LOW (ref 36.0–46.0)
Hemoglobin: 11.1 g/dL — ABNORMAL LOW (ref 12.0–15.0)
MCH: 27.1 pg (ref 26.0–34.0)
MCHC: 32.1 g/dL (ref 30.0–36.0)
MCV: 84.6 fL (ref 80.0–100.0)
Platelets: 307 10*3/uL (ref 150–400)
RBC: 4.09 MIL/uL (ref 3.87–5.11)
RDW: 14 % (ref 11.5–15.5)
WBC: 14.2 10*3/uL — ABNORMAL HIGH (ref 4.0–10.5)
nRBC: 0 % (ref 0.0–0.2)

## 2019-04-22 LAB — GLUCOSE, CAPILLARY
Glucose-Capillary: 131 mg/dL — ABNORMAL HIGH (ref 70–99)
Glucose-Capillary: 138 mg/dL — ABNORMAL HIGH (ref 70–99)
Glucose-Capillary: 141 mg/dL — ABNORMAL HIGH (ref 70–99)
Glucose-Capillary: 122 mg/dL — ABNORMAL HIGH (ref 70–99)
Glucose-Capillary: 153 mg/dL — ABNORMAL HIGH (ref 70–99)
Glucose-Capillary: 132 mg/dL — ABNORMAL HIGH (ref 70–99)
Glucose-Capillary: 117 mg/dL — ABNORMAL HIGH (ref 70–99)
Glucose-Capillary: 113 mg/dL — ABNORMAL HIGH (ref 70–99)
Glucose-Capillary: 116 mg/dL — ABNORMAL HIGH (ref 70–99)
Glucose-Capillary: 157 mg/dL — ABNORMAL HIGH (ref 70–99)
Glucose-Capillary: 113 mg/dL — ABNORMAL HIGH (ref 70–99)
Glucose-Capillary: 110 mg/dL — ABNORMAL HIGH (ref 70–99)
Glucose-Capillary: 116 mg/dL — ABNORMAL HIGH (ref 70–99)
Glucose-Capillary: 112 mg/dL — ABNORMAL HIGH (ref 70–99)

## 2019-04-22 LAB — CERVICOVAGINAL ANCILLARY ONLY
Chlamydia: NEGATIVE
Comment: NEGATIVE
Comment: NORMAL
Neisseria Gonorrhea: NEGATIVE

## 2019-04-22 LAB — RPR: RPR Ser Ql: NONREACTIVE

## 2019-04-22 MED ORDER — PHENYLEPHRINE 40 MCG/ML (10ML) SYRINGE FOR IV PUSH (FOR BLOOD PRESSURE SUPPORT): 80.0000 ug | PREFILLED_SYRINGE | INTRAVENOUS | Status: DC | PRN

## 2019-04-22 MED ORDER — FENTANYL-BUPIVACAINE-NACL 0.5-0.125-0.9 MG/250ML-% EP SOLN
12.0000 mL/h | EPIDURAL | Status: DC | PRN
  Filled 2019-04-22: qty 250

## 2019-04-22 MED ORDER — INSULIN ASPART 100 UNIT/ML ~~LOC~~ SOLN
1.0000 [IU] | Freq: Once | SUBCUTANEOUS | Status: AC
  Administered 2019-04-22: 1 [IU] via SUBCUTANEOUS

## 2019-04-22 MED ORDER — DEXTROSE-NACL 5-0.45 % IV SOLN: INTRAVENOUS | Status: DC

## 2019-04-22 MED ORDER — DEXTROSE 50 % IV SOLN: 0.0000 mL | INTRAVENOUS | Status: DC | PRN

## 2019-04-22 MED ORDER — INSULIN REGULAR(HUMAN) IN NACL 100-0.9 UT/100ML-% IV SOLN
INTRAVENOUS | Status: DC
  Administered 2019-04-22: 10:00:00 1.6 [IU]/h via INTRAVENOUS
  Administered 2019-04-22: 17:00:00 1 [IU]/h via INTRAVENOUS
  Administered 2019-04-22: 14:00:00 1.5 [IU]/h via INTRAVENOUS
  Administered 2019-04-22: 10:00:00 2.8 [IU]/h via INTRAVENOUS
  Administered 2019-04-22 (×2): 1.5 [IU]/h via INTRAVENOUS
  Administered 2019-04-22: 16:00:00 3.2 [IU]/h via INTRAVENOUS
  Administered 2019-04-22: 1.4 [IU]/h via INTRAVENOUS
  Administered 2019-04-22: 11:00:00 1.8 [IU]/h via INTRAVENOUS
  Administered 2019-04-22: 19:00:00 1.3 [IU]/h via INTRAVENOUS
  Filled 2019-04-22: qty 100

## 2019-04-22 MED ORDER — LACTATED RINGERS IV SOLN
500.0000 mL | Freq: Once | INTRAVENOUS | Status: AC
  Administered 2019-04-22: 500 mL via INTRAVENOUS

## 2019-04-22 MED ORDER — LIDOCAINE HCL (PF) 1 % IJ SOLN
INTRAMUSCULAR | Status: DC | PRN
  Administered 2019-04-22 (×2): 4 mL via EPIDURAL

## 2019-04-22 MED ORDER — TERBUTALINE SULFATE 1 MG/ML IJ SOLN: 0.2500 mg | Freq: Once | INTRAMUSCULAR | Status: DC | PRN

## 2019-04-22 MED ORDER — EPHEDRINE 5 MG/ML INJ: 10.0000 mg | INTRAVENOUS | Status: DC | PRN

## 2019-04-22 MED ORDER — LACTATED RINGERS IV SOLN: 500.0000 mL | Freq: Once | INTRAVENOUS | Status: AC

## 2019-04-22 MED ORDER — OXYTOCIN 40 UNITS IN NORMAL SALINE INFUSION - SIMPLE MED
1.0000 m[IU]/min | INTRAVENOUS | Status: DC
  Administered 2019-04-22: 2 m[IU]/min via INTRAVENOUS

## 2019-04-22 MED ORDER — DIPHENHYDRAMINE HCL 50 MG/ML IJ SOLN: 12.5000 mg | INTRAMUSCULAR | Status: DC | PRN

## 2019-04-22 MED ORDER — SODIUM CHLORIDE (PF) 0.9 % IJ SOLN
INTRAMUSCULAR | Status: DC | PRN
  Administered 2019-04-22: 12 mL/h via EPIDURAL

## 2019-04-22 MED ORDER — SODIUM CHLORIDE 0.9 % IV SOLN: INTRAVENOUS | Status: DC

## 2019-04-22 NOTE — Progress Notes (Signed)
LABOR PROGRESS NOTE  Denise Gillespie is a 29 y.o. [id number] at [redacted]w[redacted]d  admitted for IOL for PreE with severe features.  Subjective: Headache improved  Objective: BP (!) 128/54   Pulse (!) 109   Temp 98.6 F (37 C) (Oral)   Resp 16   Ht 5\' 1"  (1.549 m)   Wt 123.4 kg   LMP 08/10/2018 (Within Days)   SpO2 99%   BMI 51.39 kg/m  or  Vitals:   04/21/19 2300 04/21/19 2342 04/22/19 0000 04/22/19 0101  BP:  (!) 151/89  (!) 128/54  Pulse:  (!) 102  (!) 109  Resp: 16  18 16   Temp:      TempSrc:      SpO2:      Weight:      Height:         Dilation: 1.5 Effacement (%): 40 Station: -3 Presentation: Vertex Exam by:: , MD FHT: baseline rate 145-150, moderate varibility, +acel, -decel Toco: none  Labs: Lab Results  Component Value Date   WBC 9.9 04/21/2019   HGB 10.7 (L) 04/21/2019   HCT 33.4 (L) 04/21/2019   MCV 84.3 04/21/2019   PLT 274 04/21/2019    Patient Active Problem List   Diagnosis Date Noted  . Encounter for induction of labor 04/21/2019  . Pre-eclampsia, severe 04/21/2019  . Unwanted fertility 04/07/2019  . Maternal iron deficiency anemia complicating pregnancy in third trimester 03/28/2019  . GDM (gestational diabetes mellitus) 03/27/2019  . Alpha thalassemia trait 12/28/2018  . BMI 45.0-49.9, adult (HCC) 12/28/2018  . LGSIL on Pap smear of cervix 12/03/2018  . History of VBAC 11/30/2018  . Supervision of high risk pregnancy, antepartum 11/16/2018  . History of severe pre-eclampsia 11/16/2018  . Obesity in pregnancy   . Depression   . Hydradenitis 05/25/2017  . Tobacco use disorder 05/25/2017  . Current severe episode of major depressive disorder without psychotic features (HCC) 05/25/2017  . Generalized anxiety disorder 05/25/2017    Assessment / Plan: 29 y.o. G3P2002 at [redacted]w[redacted]d here for IOL for PreE w SF.  Labor: FB placed 2100, still in place, start pitocin once FB out Fetal Wellbeing:  Cat I Pain Control:  IV pain meds PRN,  epidural upon request GBS: unknown, on PCN Anticipated MOD:  SVD  PreE w SF: persistent HA on admission, started on Mg gtt. BP's normal to mild range since admission, labs reassuring, ctm.   GDMA1: CBG q4h latent labor, q2h active labor. Initial check controlled at 94 and then had some dinner and subsequently 131, monitor closely as may need endotool.   [redacted]w[redacted]d, MD/[facility] OB Fellow  04/22/2019, 2:11 AM

## 2019-04-22 NOTE — Progress Notes (Signed)
Labor Progress Note Denise Gillespie is a 29 y.o. S4H6759 at [redacted]w[redacted]d presented for IOL S: Reports feeling contractions but otherwise doing well. She is excited to have the baby.  O:  BP (!) 145/75   Pulse (!) 108   Temp 98.3 F (36.8 C) (Oral)   Resp 18   Ht 5\' 1"  (1.549 m)   Wt 123.4 kg   LMP 08/10/2018 (Within Days)   SpO2 99%   BMI 51.39 kg/m  EFM: 140bpm/good variability/no recent accelerations/ no decelerations/ contractions 2-3 mins  CVE: Dilation: 4 Effacement (%): 70 Station: Ballotable Presentation: Vertex Exam by:: [email or web address], md   A&P: 29 y.o. 26 [redacted]w[redacted]d IOL #Labor: Progressing. 4 mU/min of Pit. #Pain: per pt request, thinking about epidural. IV pain meds PRN #FWB: Cat 2 #GBS unkown, tx appropriately (chronic/other problems) Patient Active Problem List   Diagnosis Date Noted  . Encounter for induction of labor 04/21/2019  . Pre-eclampsia, severe 04/21/2019  . Unwanted fertility 04/07/2019  . Maternal iron deficiency anemia complicating pregnancy in third trimester 03/28/2019  . GDM (gestational diabetes mellitus) 03/27/2019  . Alpha thalassemia trait 12/28/2018  . BMI 45.0-49.9, adult (HCC) 12/28/2018  . LGSIL on Pap smear of cervix 12/03/2018  . History of VBAC 11/30/2018  . Supervision of high risk pregnancy, antepartum 11/16/2018  . History of severe pre-eclampsia 11/16/2018  . Obesity in pregnancy   . Depression   . Hydradenitis 05/25/2017  . Tobacco use disorder 05/25/2017  . Current severe episode of major depressive disorder without psychotic features (HCC) 05/25/2017  . Generalized anxiety disorder 05/25/2017     05/27/2017, MS3 9:40 AM

## 2019-04-22 NOTE — Anesthesia Preprocedure Evaluation (Signed)
Anesthesia Evaluation  Patient identified by MRN, date of birth, ID band Patient awake    Reviewed: Allergy & Precautions, Patient's Chart, lab work & pertinent test results  History of Anesthesia Complications Negative for: history of anesthetic complications  Airway Mallampati: II  TM Distance: >3 FB Neck ROM: Full    Dental no notable dental hx.    Pulmonary former smoker,    Pulmonary exam normal        Cardiovascular hypertension, Normal cardiovascular exam     Neuro/Psych Anxiety Depression negative neurological ROS     GI/Hepatic negative GI ROS, Neg liver ROS,   Endo/Other  diabetes, GestationalMorbid obesity  Renal/GU negative Renal ROS  negative genitourinary   Musculoskeletal negative musculoskeletal ROS (+)   Abdominal   Peds  Hematology Hgb 11.1, plt 307   Anesthesia Other Findings Day of surgery medications reviewed with patient.  Reproductive/Obstetrics (+) Pregnancy (preE on Mg)                             Anesthesia Physical Anesthesia Plan  ASA: III  Anesthesia Plan: Epidural   Post-op Pain Management:    Induction:   PONV Risk Score and Plan: Treatment may vary due to age or medical condition  Airway Management Planned: Natural Airway  Additional Equipment:   Intra-op Plan:   Post-operative Plan:   Informed Consent: I have reviewed the patients History and Physical, chart, labs and discussed the procedure including the risks, benefits and alternatives for the proposed anesthesia with the patient or authorized representative who has indicated his/her understanding and acceptance.       Plan Discussed with:   Anesthesia Plan Comments:         Anesthesia Quick Evaluation

## 2019-04-22 NOTE — Discharge Summary (Addendum)
Postpartum Discharge Summary    Patient Name: Denise Gillespie DOB: January 07, 1991 MRN: [id number]  Date of admission: 04/21/2019 Delivering Provider: Chauncey Mann   Date of discharge: 04/24/2019  Admitting diagnosis: Encounter for induction of labor [Z34.90] Intrauterine pregnancy: [redacted]w[redacted]d    Secondary diagnosis:  Principal Problem:   Pre-eclampsia, severe, delivered Active Problems:   Hydradenitis   Supervision of high risk pregnancy, antepartum   M[place] morbid obesity, antepartum ([place]   History of VBAC   GDM (gestational diabetes mellitus)   Maternal iron deficiency anemia complicating pregnancy in third trimester   Unwanted fertility      Discharge diagnosis: Preterm Pregnancy Delivered, VBAC, Preeclampsia (severe) and GDM A1                                                                                                Post partum procedures: None  Augmentation: AROM, Pitocin and Foley Balloon  Complications: None  Hospital course:  Induction of Labor With Vaginal Delivery   29y.o. yo [id number] 333w3das admitted to the hospital 04/21/2019 for induction of labor.  Indication for induction: Preeclampsia.  Patient had an uncomplicated labor course as follows: Initial SVE 1.5/40/-3. Patient received FB and Pitocin. AROM performed. Received epidural and progressed to complete with uncomplicated delivery.  Membrane Rupture Time/Date: 4:30 PM ,04/22/2019   Intrapartum Procedures: Episiotomy: None [1]                                         Lacerations:  None [1]  Patient had delivery of a Viable infant.  Information for the patient's newborn:  [place], Heidel0[[id number]]Delivery Method: Vag-Spont    04/22/2019  Details of delivery can be found in separate delivery note.  Patient had a routine postpartum course. Mag continued for 24 hours post-partum. Patient started on Enalapril 10 mg and BP's monitored were stable. She was on EndoTool during labor course, sugars  monitored post-partum were stable. Patient is discharged home 04/24/19. Delivery time: 10:25 PM    Magnesium Sulfate received: Yes BMZ received: Yesx2 Rhophylac:No MMR:No Transfusion:No  Physical exam  Vitals:   04/24/19 0932 04/24/19 1135 04/24/19 1337 04/24/19 1515  BP: (!) 156/89 (!) 143/85 140/86 122/82  Pulse: 95 94 92 82  Resp: 18 18    Temp: 98.3 F (36.8 C) 98.5 F (36.9 C)    TempSrc: Oral Oral    SpO2: 98% 99%    Weight:      Height:       General: alert and no distress Lochia: appropriate Uterine Fundus: firm Incision: Healing well with no significant drainage, No significant erythema, Dressing is clean, dry, and intact DVT Evaluation: No evidence of DVT seen on physical exam. Negative Homan's sign. No cords or calf tenderness. No significant calf/ankle edema. Labs: Lab Results  Component Value Date   WBC 21.8 (H) 04/23/2019   HGB 10.2 (L) 04/23/2019   HCT 31.5 (L) 04/23/2019   MCV 83.8 04/23/2019   PLT  291 04/23/2019   CMP Latest Ref Rng & Units 04/23/2019  Glucose 70 - 99 mg/dL 127(H)  BUN 6 - 20 mg/dL 7  Creatinine 0.44 - 1.00 mg/dL 0.53  Sodium 135 - 145 mmol/L 135  Potassium 3.5 - 5.1 mmol/L 4.4  Chloride 98 - 111 mmol/L 103  CO2 22 - 32 mmol/L 21(L)  Calcium 8.9 - 10.3 mg/dL 7.9(L)  Total Protein 6.5 - 8.1 g/dL 6.4(L)  Total Bilirubin 0.3 - 1.2 mg/dL 0.2(L)  Alkaline Phos 38 - 126 U/L 83  AST 15 - 41 U/L 36  ALT 0 - 44 U/L 20   Edinburgh Score: Edinburgh Postnatal Depression Scale Screening Tool 04/23/2019  I have been able to laugh and see the funny side of things. 0  I have looked forward with enjoyment to things. 0  I have blamed myself unnecessarily when things went wrong. 0  I have been anxious or worried for no good reason. 0  I have felt scared or panicky for no good reason. 0  Things have been getting on top of me. 1  I have been so unhappy that I have had difficulty sleeping. 0  I have felt sad or miserable. 0  I have been so  unhappy that I have been crying. 0  The thought of harming myself has occurred to me. 0  Edinburgh Postnatal Depression Scale Total 1    Discharge instruction: per After Visit Summary and "Baby and Me Booklet".  After visit meds:  Allergies as of 04/24/2019   No Known Allergies     Medication List    STOP taking these medications   Accu-Chek Guide w/Device Kit   Accu-Chek Softclix Lancets lancets   glucose blood test strip     TAKE these medications   clindamycin 300 MG capsule Commonly known as: CLEOCIN Take 1 capsule (300 mg total) by mouth 2 (two) times daily.   cyclobenzaprine 10 MG tablet Commonly known as: FLEXERIL Take 1 tablet (10 mg total) by mouth at bedtime as needed for muscle spasms.   enalapril 10 MG tablet Commonly known as: VASOTEC Take 1 tablet (10 mg total) by mouth daily. Start taking on: April 25, 2019   ibuprofen 800 MG tablet Commonly known as: ADVIL Take 1 tablet (800 mg total) by mouth 3 (three) times daily with meals as needed for mild pain, moderate pain or cramping.   iron polysaccharides 150 MG capsule Commonly known as: NIFEREX Take 1 capsule (150 mg total) by mouth daily.   multivitamin-prenatal 27-0.8 MG Tabs tablet Take 1 tablet by mouth daily at 12 noon.   pantoprazole 20 MG tablet Commonly known as: Protonix Take 1 tablet (20 mg total) by mouth daily.   rifampin 300 MG capsule Commonly known as: RIFADIN Take 1 capsule (300 mg total) by mouth 2 (two) times daily.   senna-docusate 8.6-50 MG tablet Commonly known as: Senokot-S Take 1 tablet by mouth daily. Start taking on: April 25, 2019       Diet: routine diet  Activity: Advance as tolerated. Pelvic rest for 6 weeks.   Outpatient follow up: This week for BP Check  2 weeks: MD consult for interval BTL and 4 weeks for PP visit and 2 hr GTT Follow up Appt: Future Appointments  Date Time Provider [place]  04/27/2019 12:30 PM [place] [facility]   04/27/2019 12:30 PM [place] 1 [facility]     Newborn Data: Live born female  Birth Weight: 3124 g APGAR: 8,  9 Underwent inpatient circumcision  Newborn Delivery   Birth date/time: 04/22/2019 22:25:00 Delivery type: VBAC, Spontaneous      Baby Feeding: Breast Disposition:home with mother   04/24/2019 Verita Schneiders, MD

## 2019-04-22 NOTE — Anesthesia Procedure Notes (Signed)
Epidural Patient location during procedure: OB Start time: 04/22/2019 1:51 PM End time: 04/22/2019 1:55 PM  Staffing Anesthesiologist: Kaylyn Layer, MD Performed: anesthesiologist   Preanesthetic Checklist Completed: patient identified, IV checked, risks and benefits discussed, monitors and equipment checked, pre-op evaluation and timeout performed  Epidural Patient position: sitting Prep: DuraPrep and site prepped and draped Patient monitoring: continuous pulse ox, blood pressure and heart rate Approach: midline Location: L3-L4 Injection technique: LOR air  Needle:  Needle type: Tuohy  Needle gauge: 17 G Needle length: 9 cm Needle insertion depth: 9 cm Catheter type: closed end flexible Catheter size: 19 Gauge Catheter at skin depth: 14 cm Test dose: negative and Other (1% lidocaine)  Assessment Events: blood not aspirated, injection not painful, no injection resistance, no paresthesia and negative IV test  Additional Notes Patient identified. Risks, benefits, and alternatives discussed with patient including but not limited to bleeding, infection, nerve damage, paralysis, failed block, incomplete pain control, headache, blood pressure changes, nausea, vomiting, reactions to medication, itching, and postpartum back pain. Confirmed with bedside nurse the patient's most recent platelet count. Confirmed with patient that they are not currently taking any anticoagulation, have any bleeding history, or any family history of bleeding disorders. Patient expressed understanding and wished to proceed. All questions were answered. Sterile technique was used throughout the entire procedure. Please see nursing notes for vital signs.   Crisp LOR on first pass. Test dose was given through epidural catheter and negative prior to continuing to dose epidural or start infusion. Warning signs of high block given to the patient including shortness of breath, tingling/numbness in hands, complete  motor block, or any concerning symptoms with instructions to call for help. Patient was given instructions on fall risk and not to get out of bed. All questions and concerns addressed with instructions to call with any issues or inadequate analgesia.  Reason for block:procedure for pain

## 2019-04-22 NOTE — Progress Notes (Signed)
Labor Progress Note Denise Gillespie is a 29 y.o. G3P2002 at [redacted]w[redacted]d presented for IOL S: Pt reports feeling contractions, in pain, and has elected to go ahead with the epidural.  O:  BP (!) 141/91   Pulse (!) 106   Temp 98.3 F (36.8 C) (Oral)   Resp 16   Ht 5\' 1"  (1.549 m)   Wt 123.4 kg   LMP 08/10/2018 (Within Days)   SpO2 99%   BMI 51.39 kg/m  EFM: 135 bpm/good varibility/no accelerations/ no decelerations/ contractions 10 min.   CVE: Dilation: 4 Effacement (%): 70 Station: Ballotable Presentation: Vertex Exam by:: [email or web address], md   A&P: 29 y.o. 26 [redacted]w[redacted]d IOL #Labor: Progressing. Continuing Pitocin 36mU/min #Pain: Starting epidural #FWB: Cat 1 #GBS unknown, tx appropriately (chronic/other problems) Patient Active Problem List   Diagnosis Date Noted  . Encounter for induction of labor 04/21/2019  . Pre-eclampsia, severe 04/21/2019  . Unwanted fertility 04/07/2019  . Maternal iron deficiency anemia complicating pregnancy in third trimester 03/28/2019  . GDM (gestational diabetes mellitus) 03/27/2019  . Alpha thalassemia trait 12/28/2018  . BMI 45.0-49.9, adult (HCC) 12/28/2018  . LGSIL on Pap smear of cervix 12/03/2018  . History of VBAC 11/30/2018  . Supervision of high risk pregnancy, antepartum 11/16/2018  . History of severe pre-eclampsia 11/16/2018  . Obesity in pregnancy   . Depression   . Hydradenitis 05/25/2017  . Tobacco use disorder 05/25/2017  . Current severe episode of major depressive disorder without psychotic features (HCC) 05/25/2017  . Generalized anxiety disorder 05/25/2017     05/27/2017, MS3 1:24 PM

## 2019-04-22 NOTE — Progress Notes (Signed)
Patient ID: Denise Gillespie, female   DOB: 04-Apr-1990, 29 y.o.   MRN: [id number]  Comfortable with epidural;  H/A gone with Tylenol  BP 155/94, P 109 FHR 140, 10x10accels, min variability, no decels Ctx q 2-3 mins; Pit @ 7mu/min Cx 6/70/vtx -2 @ 1825 by RN  IUP@36 .3wks Early active labor GDMA1 TOLAC Severe pre-e  Has had cx change; will check in 2hrs  Arabella Merles [facility] 04/22/2019 7:34 PM

## 2019-04-22 NOTE — Progress Notes (Signed)
LABOR PROGRESS NOTE  Denise Gillespie is a 29 y.o. P6P9509 at [redacted]w[redacted]d  admitted for IOL for PreE with severe features.  Subjective: Feeling contractions intermittently  Objective: BP (!) 159/95   Pulse (!) 103   Temp 98.6 F (37 C) (Oral)   Resp 16   Ht 5\' 1"  (1.549 m)   Wt 123.4 kg   LMP 08/10/2018 (Within Days)   SpO2 99%   BMI 51.39 kg/m  or  Vitals:   04/22/19 0355 04/22/19 0436 04/22/19 0546 04/22/19 0601  BP: 132/72 (!) 150/94 (!) 160/110 (!) 159/95  Pulse: 96 (!) 102 (!) 111 (!) 103  Resp: 16 16  16   Temp:    98.6 F (37 C)  TempSrc:    Oral  SpO2:      Weight:      Height:         Dilation: 4 Effacement (%): 70 Station: Ballotable Presentation: Vertex Exam by:: , md FHT: baseline rate 145, moderate varibility, +acel, -decel Toco: irregular  Labs: Lab Results  Component Value Date   WBC 9.9 04/21/2019   HGB 10.7 (L) 04/21/2019   HCT 33.4 (L) 04/21/2019   MCV 84.3 04/21/2019   PLT 274 04/21/2019    Patient Active Problem List   Diagnosis Date Noted  . Encounter for induction of labor 04/21/2019  . Pre-eclampsia, severe 04/21/2019  . Unwanted fertility 04/07/2019  . Maternal iron deficiency anemia complicating pregnancy in third trimester 03/28/2019  . GDM (gestational diabetes mellitus) 03/27/2019  . Alpha thalassemia trait 12/28/2018  . BMI 45.0-49.9, adult (HCC) 12/28/2018  . LGSIL on Pap smear of cervix 12/03/2018  . History of VBAC 11/30/2018  . Supervision of high risk pregnancy, antepartum 11/16/2018  . History of severe pre-eclampsia 11/16/2018  . Obesity in pregnancy   . Depression   . Hydradenitis 05/25/2017  . Tobacco use disorder 05/25/2017  . Current severe episode of major depressive disorder without psychotic features (HCC) 05/25/2017  . Generalized anxiety disorder 05/25/2017    Assessment / Plan: 29 y.o. G3P2002 at [redacted]w[redacted]d here for IOL for PreE w SF.  Labor: FB out now with favorable cervix, start  pitocin Fetal Wellbeing:  Cat I Pain Control:  IV pain meds PRN, epidural upon request GBS: unknown, on PCN Anticipated MOD:  SVD  PreE w SF: persistent HA on admission, started on Mg gtt. One severe range BP overnight that resolved, ctm.   GDMA1: CBG q4h latent labor, q2h active labor. Given dose of BMZ on admission. Initial check controlled at 94 and then had some dinner and subsequently 131, subsequent check 138 and given 1u aspart, consider endotool at next check.   26, MD/MPH OB Fellow  04/22/2019, 6:52 AM

## 2019-04-22 NOTE — Progress Notes (Addendum)
Labor Progress Note Denise Gillespie is a 29 y.o. G3P2002 at [redacted]w[redacted]d presented for IOL for severe pre E (HA, Blurred vision) S: denies BV, HA, CP, SOB, abd pain. Does endorse feeling contractions but does not know how far apart they have been because she is not keeping track.   O:  BP 135/67   Pulse (!) 109   Temp 98.4 F (36.9 C) (Oral)   Resp 16   Ht 5\' 1"  (1.549 m)   Wt 123.4 kg   LMP 08/10/2018 (Within Days)   SpO2 99%   BMI 51.39 kg/m  EFM: 135/mod var. accels present/variable decel  CVE: Dilation: 6 Effacement (%): 70 Cervical Position: Middle Station: -2 Presentation: Vertex Exam by:: [email or web address], RN   A&P: 29 y.o. 26 [redacted]w[redacted]d here for IOL for severe pre E.  Premature at [redacted]w[redacted]d.  #Labor: Progressing well. Will consider placing IUPC at next cervical check. Pitocin at 22 mU/min #Pain: epidural #FWB: cat II #GBS pending. PCN given # pre-E - mag 2g/hr.    # prematurity - Betamethasone given.  Update: rechecked at 920pm. Pt is 8/90/0.  IUPC placed.  Contractions adequate.    [redacted]w[redacted]d, MD 8:53 PM

## 2019-04-22 NOTE — Progress Notes (Signed)
Patient ID: Denise Gillespie, female   DOB: 20-Mar-1990, 29 y.o.   MRN: [id number]  Recently received epidural- doing well; on mag sulfate; Endotool with insulin gtt going; dull frontal H/A  BP 135/77, P 115 FHR 135-140, +accels, no decels Ctx difficult to trace, Pit at 74mu/min Cx 5/70/vtx -2; AROM- clear fluid  CBGs: 116, 157 (after [place])  IUP@36 .3wks IOL process GDMA1 TOLAC Severe pre-e  Will try Tylenol for H/A Plan to check cx in 2hrs Anticipate VBAC  Arabella Merles [facility] 04/22/2019 4:42 PM

## 2019-04-23 ENCOUNTER — Encounter (HOSPITAL_COMMUNITY): Payer: Self-pay | Admitting: Family Medicine

## 2019-04-23 ENCOUNTER — Encounter (HOSPITAL_COMMUNITY)
Admission: AD | Disposition: A | Discharge status: Home / Self Care | Payer: Self-pay | Source: Home / Self Care | Attending: Obstetrics & Gynecology

## 2019-04-23 DIAGNOSIS — O24419 Gestational diabetes mellitus in pregnancy, unspecified control: Secondary | ICD-10-CM

## 2019-04-23 DIAGNOSIS — O1490 Unspecified pre-eclampsia, unspecified trimester: Secondary | ICD-10-CM

## 2019-04-23 LAB — COMPREHENSIVE METABOLIC PANEL
ALT: 20 U/L (ref 0–44)
AST: 36 U/L (ref 15–41)
Albumin: 2.8 g/dL — ABNORMAL LOW (ref 3.5–5.0)
Alkaline Phosphatase: 83 U/L (ref 38–126)
Anion gap: 11 (ref 5–15)
BUN: 7 mg/dL (ref 6–20)
CO2: 21 mmol/L — ABNORMAL LOW (ref 22–32)
Calcium: 7.9 mg/dL — ABNORMAL LOW (ref 8.9–10.3)
Chloride: 103 mmol/L (ref 98–111)
Creatinine, Ser: 0.53 mg/dL (ref 0.44–1.00)
GFR calc Af Amer: >60 mL/min (ref >60)
GFR calc non Af Amer: >60 mL/min (ref >60)
Glucose, Bld: 127 mg/dL — ABNORMAL HIGH (ref 70–99)
Potassium: 4.4 mmol/L (ref 3.5–5.1)
Sodium: 135 mmol/L (ref 135–145)
Total Bilirubin: 0.2 mg/dL — ABNORMAL LOW (ref 0.3–1.2)
Total Protein: 6.4 g/dL — ABNORMAL LOW (ref 6.5–8.1)

## 2019-04-23 LAB — GLUCOSE, CAPILLARY
Glucose-Capillary: 169 mg/dL — ABNORMAL HIGH (ref 70–99)
Glucose-Capillary: 115 mg/dL — ABNORMAL HIGH (ref 70–99)

## 2019-04-23 LAB — MAGNESIUM: Magnesium: 4.5 mg/dL — ABNORMAL HIGH (ref 1.7–2.4)

## 2019-04-23 LAB — CBC
HCT: 31.5 % — ABNORMAL LOW (ref 36.0–46.0)
Hemoglobin: 10.2 g/dL — ABNORMAL LOW (ref 12.0–15.0)
MCH: 27.1 pg (ref 26.0–34.0)
MCHC: 32.4 g/dL (ref 30.0–36.0)
MCV: 83.8 fL (ref 80.0–100.0)
Platelets: 291 10*3/uL (ref 150–400)
RBC: 3.76 MIL/uL — ABNORMAL LOW (ref 3.87–5.11)
RDW: 14.1 % (ref 11.5–15.5)
WBC: 21.8 10*3/uL — ABNORMAL HIGH (ref 4.0–10.5)
nRBC: 0 % (ref 0.0–0.2)

## 2019-04-23 LAB — HEMOGLOBIN A1C
Hgb A1c MFr Bld: 5.8 % — ABNORMAL HIGH (ref 4.8–5.6)
Mean Plasma Glucose: 120 mg/dL

## 2019-04-23 SURGERY — LIGATION, FALLOPIAN TUBE, POSTPARTUM
Anesthesia: Epidural | Laterality: Bilateral

## 2019-04-23 MED ORDER — BENZOCAINE-MENTHOL 20-0.5 % EX AERO: 1.0000 "application " | INHALATION_SPRAY | CUTANEOUS | Status: DC | PRN

## 2019-04-23 MED ORDER — TETANUS-DIPHTH-ACELL PERTUSSIS 5-2.5-18.5 LF-MCG/0.5 IM SUSP: 0.5000 mL | Freq: Once | INTRAMUSCULAR | Status: DC

## 2019-04-23 MED ORDER — MEASLES, MUMPS & RUBELLA VAC IJ SOLR: 0.5000 mL | Freq: Once | INTRAMUSCULAR | Status: DC

## 2019-04-23 MED ORDER — ACETAMINOPHEN 325 MG PO TABS: 650.0000 mg | ORAL_TABLET | Freq: Four times a day (QID) | ORAL | Status: DC | PRN

## 2019-04-23 MED ORDER — COCONUT OIL OIL: 1.0000 "application " | TOPICAL_OIL | Status: DC | PRN

## 2019-04-23 MED ORDER — LACTATED RINGERS IV SOLN: INTRAVENOUS | Status: DC

## 2019-04-23 MED ORDER — ONDANSETRON HCL 4 MG/2ML IJ SOLN: 4.0000 mg | INTRAMUSCULAR | Status: DC | PRN

## 2019-04-23 MED ORDER — SIMETHICONE 80 MG PO CHEW: 80.0000 mg | CHEWABLE_TABLET | ORAL | Status: DC | PRN

## 2019-04-23 MED ORDER — DIPHENHYDRAMINE HCL 25 MG PO CAPS: 25.0000 mg | ORAL_CAPSULE | Freq: Four times a day (QID) | ORAL | Status: DC | PRN

## 2019-04-23 MED ORDER — DIBUCAINE (PERIANAL) 1 % EX OINT: 1.0000 "application " | TOPICAL_OINTMENT | CUTANEOUS | Status: DC | PRN

## 2019-04-23 MED ORDER — IBUPROFEN 600 MG PO TABS: 600.0000 mg | ORAL_TABLET | Freq: Three times a day (TID) | ORAL | Status: DC | PRN

## 2019-04-23 MED ORDER — ENALAPRIL MALEATE 5 MG PO TABS
5.0000 mg | ORAL_TABLET | Freq: Every day | ORAL | Status: DC
  Administered 2019-04-23: 5 mg via ORAL
  Filled 2019-04-23: qty 1

## 2019-04-23 MED ORDER — PRENATAL MULTIVITAMIN CH
1.0000 | ORAL_TABLET | Freq: Every day | ORAL | Status: DC
  Administered 2019-04-23: 10:00:00 1 via ORAL
  Filled 2019-04-23 (×2): qty 1

## 2019-04-23 MED ORDER — SENNOSIDES-DOCUSATE SODIUM 8.6-50 MG PO TABS
2.0000 | ORAL_TABLET | ORAL | Status: DC
  Administered 2019-04-24: 01:00:00 2 via ORAL
  Filled 2019-04-23: qty 2

## 2019-04-23 MED ORDER — MAGNESIUM SULFATE 40 GM/1000ML IV SOLN
2.0000 g/h | INTRAVENOUS | Status: AC
  Administered 2019-04-23: 11:00:00 2 g/h via INTRAVENOUS
  Filled 2019-04-23: qty 1000

## 2019-04-23 MED ORDER — WITCH HAZEL-GLYCERIN EX PADS: 1.0000 "application " | MEDICATED_PAD | CUTANEOUS | Status: DC | PRN

## 2019-04-23 MED ORDER — ONDANSETRON HCL 4 MG PO TABS: 4.0000 mg | ORAL_TABLET | ORAL | Status: DC | PRN

## 2019-04-23 NOTE — Anesthesia Postprocedure Evaluation (Signed)
Anesthesia Post Note  Patient: Denise Gillespie  Procedure(s) Performed: AN AD HOC LABOR EPIDURAL     Patient location during evaluation: Mother Baby Anesthesia Type: Epidural Level of consciousness: awake and alert Pain management: pain level controlled Vital Signs Assessment: post-procedure vital signs reviewed and stable Respiratory status: spontaneous breathing, nonlabored ventilation and respiratory function stable Cardiovascular status: stable Postop Assessment: no headache, no backache and epidural receding Anesthetic complications: no    Last Vitals:  Vitals:   04/23/19 0600 04/23/19 0700  BP:    Pulse:    Resp: 18 17  Temp:    SpO2:      Last Pain:  Vitals:   04/23/19 0410  TempSrc:   PainSc: 0-No pain   Pain Goal: Patients Stated Pain Goal: 5 (04/23/19 0100)                 Rica Records

## 2019-04-23 NOTE — Plan of Care (Signed)
  Problem: Education: Goal: Knowledge of General Education information will improve Description: Including pain rating scale, medication(s)/side effects and non-pharmacologic comfort measures Outcome: Completed/Met

## 2019-04-23 NOTE — Lactation Note (Signed)
This note was copied from a baby's chart. Lactation Consultation Note  Patient Name: Denise Gillespie VFIEP'P Date: 04/23/2019 Reason for consult: Initial assessment;Late-preterm 34-36.6wks;1st time breastfeeding  P3 mother whose infant is now 79 hours old.  This is a LPTI at 36+3 weeks with a CGA of 36+4 weeks.  Mother does not have breast feeding experience.  Mother's feeding preference is breast/bottle.  Baby was asleep in the bassinet when I arrived.  Mother stated that he had an episode this a.m. where he "choked" and had a spit up.  Since this happened he has not been interested in feeding.    Reviewed the LPTI policy with mother.  Discussed supplementation guidelines.  Mother has not put baby to breast yet; used formula only.  Offered to awaken baby and assist with latching.  Mother agreeable.  Considerable amount of teaching done regarding breast feeding basics.  Taught mother hand expression.  She was unable to express any colostrum at this time.  Container provided and milk storage times reviewed.  Finger feeding demonstrated.    Mother's breasts are large, soft and non tender.  Nipples are short shafted and everted.  Attempted to latch baby to the breast, however, he was very sleepy.  He latched after two attempts but only sucked a few times before falling asleep.  Reassured mother that this is typical behavior for any baby at 10 hours of life and, particularly, common for a LPTI.  Mother understanding.  Asked her permission to demonstrate paced bottle feeding.  Allowed infant to suck on my gloved finger prior to bottle feeding.  His suck was uncoordinated.  He required cheek and jaw support to adequately suck.  Using the gold slow flow nipple with support he was able to consume 12 mls after working with him for 10 minutes.  Mother observed him feeding.  He was a little bit spitty and demonstrated effective burping.  Baby burped well.  At the end of his feeding I placed him in mother's  arms.    Mother does not have a DEBP for home use. She has pregnancy Medicaid but not WIC.  She plans to apply for [facility].  Discussed the process for applying and suggested she call first thing Monday morning.  [facility] referral faxed.  Also asked mother to make a follow up phone call to the [place] [facility] office Monday morning.  Informed her of our [facility] loaner program and she is interested in doing this until she can obtain a WIC pump.  Mother will continue to feed baby at least every three hours and more often as desired.  She will supplement according to the LPTI guidelines and call her RN/LC for assistance as needed.  Encouraged to post pump for 15 minutes.  Manual pump provided with instructions for use to help evert nipples.  Lactation brochure not available at this time for distribution.  Grandmother present.  RN updated.   Maternal Data Formula Feeding for Exclusion: Yes Reason for exclusion: Mother's choice to formula and breast feed on admission Has patient been taught Hand Expression?: Yes Does the patient have breastfeeding experience prior to this delivery?: No  Feeding Feeding Type: Bottle Fed - Formula Nipple Type: Slow - flow  LATCH Score Latch: Too sleepy or reluctant, no latch achieved, no sucking elicited.  Audible Swallowing: None  Type of Nipple: Everted at rest and after stimulation(short shafted)  Comfort (Breast/Nipple): Soft / non-tender  Hold (Positioning): Assistance needed to correctly position infant at breast and maintain  latch.  LATCH Score: 5  Interventions Interventions: Breast feeding basics reviewed;Skin to skin;Assisted with latch;Breast massage;Hand express;Pre-pump if needed;Breast compression;Adjust position;DEBP;Hand pump;Position options;Support pillows  Lactation Tools Discussed/Used Tools: Bottle WIC Program: Yes Pump Review: Milk Storage;Setup, frequency, and cleaning(Reviewed) Initiated by:: Denise Gillespie   Consult Status Consult  Status: Follow-up Date: 04/24/19 Follow-up type: Call as needed    Denise Gillespie Denise Gillespie 04/23/2019, 9:03 AM

## 2019-04-23 NOTE — Progress Notes (Signed)
Post Partum Day 1  Subjective: no complaints, voiding, + flatus and denies, HA, RUQ pain, vision changes. Has been NPO overnight for PP Tubal   Objective: Blood pressure (!) 137/96, pulse (!) 124, temperature 98.3 F (36.8 C), temperature source Oral, resp. rate 18, height 5\' 1"  (1.549 m), weight 123.4 kg, last menstrual period 08/10/2018, SpO2 99 %, unknown if currently breastfeeding.  Physical Exam:  General: alert and cooperative Lochia: appropriate Uterine Fundus: firm Incision: N/A DVT Evaluation: No evidence of DVT seen on physical exam. Negative Homan's sign.  Recent Labs    04/21/19 1614 04/22/19 1025  HGB 10.7* 11.1*  HCT 33.4* 34.6*    Assessment/Plan: 29yo 06/22/19 s/p VBAC at 36wks w/ severe PreE Breastfeeding, Lactation consult, Circumcision prior to discharge and Contraception postpartum tubal  24hr Mag postpartum complete @ 1025   LOS: 2 days   [id number] 04/23/2019, 7:10 AM

## 2019-04-23 NOTE — Progress Notes (Signed)
29 y.o. yo [phone]  with undesired fertility,status post vaginal delivery who desires permanent sterilization. Risks and benefits of procedure discussed with patient including permanence of method, bleeding, infection, injury to surrounding organs and need for additional procedures. Risk failure of 0.5-1% with increased risk of ectopic gestation if pregnancy occurs was also discussed with patient. Difficult to palpate uterine fundus due to high BMI 51. Discussed with patient that it may be difficult to visualize fallopian tubes for that reason. Discussed interval laparoscopic BTL and patient prefers that option.

## 2019-04-24 MED ORDER — CLINDAMYCIN HCL 300 MG PO CAPS: 300.0000 mg | ORAL_CAPSULE | Freq: Two times a day (BID) | ORAL | 5 refills | Status: DC

## 2019-04-24 MED ORDER — IBUPROFEN 800 MG PO TABS: 800.0000 mg | ORAL_TABLET | Freq: Three times a day (TID) | ORAL | 2 refills | Status: DC | PRN

## 2019-04-24 MED ORDER — ENALAPRIL MALEATE 10 MG PO TABS: 10.0000 mg | ORAL_TABLET | Freq: Every day | ORAL | 2 refills | Status: DC

## 2019-04-24 MED ORDER — RIFAMPIN 300 MG PO CAPS
300.0000 mg | ORAL_CAPSULE | Freq: Two times a day (BID) | ORAL | Status: DC
  Administered 2019-04-24: 11:00:00 300 mg via ORAL
  Filled 2019-04-24 (×2): qty 1

## 2019-04-24 MED ORDER — SENNOSIDES-DOCUSATE SODIUM 8.6-50 MG PO TABS: 1.0000 | ORAL_TABLET | ORAL | 2 refills | Status: DC

## 2019-04-24 MED ORDER — RIFAMPIN 300 MG PO CAPS: 300.0000 mg | ORAL_CAPSULE | Freq: Two times a day (BID) | ORAL | 3 refills | Status: DC

## 2019-04-24 MED ORDER — ENALAPRIL MALEATE 5 MG PO TABS
10.0000 mg | ORAL_TABLET | Freq: Every day | ORAL | Status: DC
  Administered 2019-04-24: 10:00:00 10 mg via ORAL
  Filled 2019-04-24: qty 2

## 2019-04-24 MED ORDER — CYCLOBENZAPRINE HCL 10 MG PO TABS: 10.0000 mg | ORAL_TABLET | Freq: Every evening | ORAL | 2 refills | Status: DC | PRN

## 2019-04-24 MED ORDER — CLINDAMYCIN HCL 300 MG PO CAPS
300.0000 mg | ORAL_CAPSULE | Freq: Two times a day (BID) | ORAL | Status: DC
  Administered 2019-04-24: 10:00:00 300 mg via ORAL
  Filled 2019-04-24: qty 1

## 2019-04-24 NOTE — Lactation Note (Signed)
This note was copied from a baby's chart. Lactation Consultation Note  Patient Name: Denise Gillespie OINOM'V Date: 04/24/2019 Reason for consult: Follow-up assessment;Late-preterm 34-36.6wks;1st time breastfeeding;Infant weight loss;Other (Comment)(mom for D/C pending B/P) Mom feeding the baby a bottle when the LC entered the room - 45 ml .  LC asked mom what her LC plan is today and she responded she had been  Giving bottles due to her HS ( Hidradentis suppurativa ) underneath her right breast  Draining and covered with a dressing. Mom mentioned she normally takes Doxycycline antibiotic and being pregnant was unable to take it.  Mom OB MD walked in and she recommended not taking it with breastfeeding and  Planned to prescription for another antibiotic since she has 2 areas draining.  LC reviewed engorgement prevention and tx .  Mom plans to call [facility] since she is not signed up with [facility] for a DEBP.  Mom has the resource numbers for Lactation.  Mom aware of the storage milk guidelines.   Maternal Data    Feeding Feeding Type: (baby just received a bottle of 45  ml)  LATCH Score                   Interventions Interventions: Breast feeding basics reviewed  Lactation Tools Discussed/Used Tools: Pump Breast pump type: Double-Electric Breast Pump;Manual WIC Program: No(per mom has to sign up for [facility])   Consult Status Consult Status: Complete Date: 04/24/19    Kathrin Greathouse 04/24/2019, 9:52 AM

## 2019-04-24 NOTE — Progress Notes (Signed)
Attending Circumcision Counseling Progress Note  Patient desires circumcision for her female infant.  Circumcision procedure details discussed, risks and benefits of procedure were also discussed.  These include but are not limited to: Benefits of circumcision in men include reduction in the rates of urinary tract infection (UTI), penile cancer, some sexually transmitted infections, penile inflammatory and retractile disorders, as well as easier hygiene.  Risks include bleeding , infection, injury of glans which may lead to penile deformity or urinary tract issues, unsatisfactory cosmetic appearance and other potential complications related to the procedure.  It was emphasized that this is an elective procedure.  Patient wants to proceed with circumcision; written informed consent obtained.  Will do circumcision soon, routine circumcision and post circumcision care ordered for the infant.  Jaynie Collins, M.D. 04/24/2019 9:31 AM

## 2019-04-24 NOTE — Plan of Care (Signed)
Pt to be discharged with printed instructions. No concerns noted. Carrington Mullenax L Martita Brumm, RN  

## 2019-04-24 NOTE — Progress Notes (Signed)
Patient reported flare-up of her hidradenitis suppurativa under her breasts and back. She is breastfeeding. Usually responds to Doxycyline but has been unable to take during pregnancy and now cannot take during lactation. After some research about treatment of HS in pregnancy, patient was prescribed Clindamycin and Rifampin (both 300 mg po bid) which she can take for up to 10-12 weeks for her symptoms.  She will be prescribed these medications when she is discharged. Patient informed.    Jaynie Collins, MD, FACOG Obstetrician & Gynecologist, [facility] for [employer], [facility]

## 2019-04-24 NOTE — Progress Notes (Signed)
Post Partum Day 2 Subjective: Patient is without complaints. She is ambulating, voiding and tolerating a regular diet. She denies HA, visual changes, RUQ/epigastric pain  Objective: Blood pressure 132/74, pulse 89, temperature 98.2 F (36.8 C), temperature source Oral, resp. rate 18, height 5\' 1"  (1.549 m), weight 123.4 kg, last menstrual period 08/10/2018, SpO2 99 %, unknown if currently breastfeeding.  Physical Exam:  General: alert, cooperative, no distress and morbidly obese Lochia: appropriate Uterine Fundus: firm DVT Evaluation: No evidence of DVT seen on physical exam.  Recent Labs    04/22/19 1025 04/23/19 0923  HGB 11.1* 10.2*  HCT 34.6* 31.5*    Assessment/Plan: 29yo  PPD#2 with preeclampsia - Magnesium discontinued at 10pm  - Continue enalapril 5 daily - Continue monitoring BP - Consider discharge planning later today   LOS: 3 days   Saunders [place] 04/24/2019, 7:55 AM

## 2019-04-24 NOTE — Discharge Instructions (Signed)
Postpartum Care After Vaginal Delivery This sheet gives you information about how to care for yourself from the time you deliver your baby to up to 6-12 weeks after delivery (postpartum period). Your health care provider may also give you more specific instructions. If you have problems or questions, contact your health care provider. Follow these instructions at home: Vaginal bleeding  It is normal to have vaginal bleeding (lochia) after delivery. Wear a sanitary pad for vaginal bleeding and discharge. ? During the first week after delivery, the amount and appearance of lochia is often similar to a menstrual period. ? Over the next few weeks, it will gradually decrease to a dry, yellow-brown discharge. ? For most women, lochia stops completely by 4-6 weeks after delivery. Vaginal bleeding can vary from woman to woman.  Change your sanitary pads frequently. Watch for any changes in your flow, such as: ? A sudden increase in volume. ? A change in color. ? Large blood clots.  If you pass a blood clot from your vagina, save it and call your health care provider to discuss. Do not flush blood clots down the toilet before talking with your health care provider.  Do not use tampons or douches until your health care provider says this is safe.  If you are not breastfeeding, your period should return 6-8 weeks after delivery. If you are feeding your child breast milk only (exclusive breastfeeding), your period may not return until you stop breastfeeding. Perineal care  Keep the area between the vagina and the anus (perineum) clean and dry as told by your health care provider. Use medicated pads and pain-relieving sprays and creams as directed.  If you had a cut in the perineum (episiotomy) or a tear in the vagina, check the area for signs of infection until you are healed. Check for: ? More redness, swelling, or pain. ? Fluid or blood coming from the cut or tear. ? Warmth. ? Pus or a bad  smell.  You may be given a squirt bottle to use instead of wiping to clean the perineum area after you go to the bathroom. As you start healing, you may use the squirt bottle before wiping yourself. Make sure to wipe gently.  To relieve pain caused by an episiotomy, a tear in the vagina, or swollen veins in the anus (hemorrhoids), try taking a warm sitz bath 2-3 times a day. A sitz bath is a warm water bath that is taken while you are sitting down. The water should only come up to your hips and should cover your buttocks. Breast care  Within the first few days after delivery, your breasts may feel heavy, full, and uncomfortable (breast engorgement). Milk may also leak from your breasts. Your health care provider can suggest ways to help relieve the discomfort. Breast engorgement should go away within a few days.  If you are breastfeeding: ? Wear a bra that supports your breasts and fits you well. ? Keep your nipples clean and dry. Apply creams and ointments as told by your health care provider. ? You may need to use breast pads to absorb milk that leaks from your breasts. ? You may have uterine contractions every time you breastfeed for up to several weeks after delivery. Uterine contractions help your uterus return to its normal size. ? If you have any problems with breastfeeding, work with your health care provider or lactation consultant.  If you are not breastfeeding: ? Avoid touching your breasts a lot. Doing this can make   your breasts produce more milk. ? Wear a good-fitting bra and use cold packs to help with swelling. ? Do not squeeze out (express) milk. This causes you to make more milk. Intimacy and sexuality  Ask your health care provider when you can engage in sexual activity. This may depend on: ? Your risk of infection. ? How fast you are healing. ? Your comfort and desire to engage in sexual activity.  You are able to get pregnant after delivery, even if you have not had  your period. If desired, talk with your health care provider about methods of birth control (contraception). Medicines  Take over-the-counter and prescription medicines only as told by your health care provider.  If you were prescribed an antibiotic medicine, take it as told by your health care provider. Do not stop taking the antibiotic even if you start to feel better. Activity  Gradually return to your normal activities as told by your health care provider. Ask your health care provider what activities are safe for you.  Rest as much as possible. Try to rest or take a nap while your baby is sleeping. Eating and drinking   Drink enough fluid to keep your urine pale yellow.  Eat high-fiber foods every day. These may help prevent or relieve constipation. High-fiber foods include: ? Whole grain cereals and breads. ? Brown rice. ? Beans. ? Fresh fruits and vegetables.  Do not try to lose weight quickly by cutting back on calories.  Take your prenatal vitamins until your postpartum checkup or until your health care provider tells you it is okay to stop. Lifestyle  Do not use any products that contain nicotine or tobacco, such as cigarettes and e-cigarettes. If you need help quitting, ask your health care provider.  Do not drink alcohol, especially if you are breastfeeding. General instructions  Keep all follow-up visits for you and your baby as told by your health care provider. Most women visit their health care provider for a postpartum checkup within the first 3-6 weeks after delivery. Contact a health care provider if:  You feel unable to cope with the changes that your child brings to your life, and these feelings do not go away.  You feel unusually sad or worried.  Your breasts become red, painful, or hard.  You have a fever.  You have trouble holding urine or keeping urine from leaking.  You have little or no interest in activities you used to enjoy.  You have not  breastfed at all and you have not had a menstrual period for 12 weeks after delivery.  You have stopped breastfeeding and you have not had a menstrual period for 12 weeks after you stopped breastfeeding.  You have questions about caring for yourself or your baby.  You pass a blood clot from your vagina. Get help right away if:  You have chest pain.  You have difficulty breathing.  You have sudden, severe leg pain.  You have severe pain or cramping in your lower abdomen.  You bleed from your vagina so much that you fill more than one sanitary pad in one hour. Bleeding should not be heavier than your heaviest period.  You develop a severe headache.  You faint.  You have blurred vision or spots in your vision.  You have bad-smelling vaginal discharge.  You have thoughts about hurting yourself or your baby. If you ever feel like you may hurt yourself or others, or have thoughts about taking your own life, get help   right away. You can go to the nearest emergency department or call:  Your local emergency services (911 in the [place]  A suicide crisis helpline, such as the National Suicide Prevention Lifeline at 1-800-273-8255. This is open 24 hours a day. Summary  The period of time right after you deliver your newborn up to 6-12 weeks after delivery is called the postpartum period.  Gradually return to your normal activities as told by your health care provider.  Keep all follow-up visits for you and your baby as told by your health care provider. This information is not intended to replace advice given to you by your health care provider. Make sure you discuss any questions you have with your health care provider. Document Revised: 01/02/2017 Document Reviewed: 10/13/2016 Elsevier Patient Education  2020 [employer] Postpartum Hypertension Postpartum hypertension is high blood pressure that remains higher than normal after childbirth. You may not realize that you have  postpartum hypertension if your blood pressure is not being checked regularly. In most cases, postpartum hypertension will go away on its own, usually within a week of delivery. However, for some women, medical treatment is required to prevent serious complications, such as seizures or stroke. What are the causes? This condition may be caused by one or more of the following:  Hypertension that existed before pregnancy (chronic hypertension).  Hypertension that comes on as a result of pregnancy (gestational hypertension).  Hypertensive disorders during pregnancy (preeclampsia) or seizures in women who have high blood pressure during pregnancy (eclampsia).  A condition in which the liver, platelets, and red blood cells are damaged during pregnancy (HELLP syndrome).  A condition in which the thyroid produces too much hormones (hyperthyroidism).  Other rare problems of the nerves (neurological disorders) or blood disorders. In some cases, the cause may not be known. What increases the risk? The following factors may make you more likely to develop this condition:  Chronic hypertension. In some cases, this may not have been diagnosed before pregnancy.  Obesity.  Type 2 diabetes.  Kidney disease.  History of preeclampsia or eclampsia.  Other medical conditions that change the level of hormones in the body (hormonal imbalance). What are the signs or symptoms? As with all types of hypertension, postpartum hypertension may not have any symptoms. Depending on how high your blood pressure is, you may experience:  Headaches. These may be mild, moderate, or severe. They may also be steady, constant, or sudden in onset (thunderclap headache).  Changes in your ability to see (visual changes).  Dizziness.  Shortness of breath.  Swelling of your hands, feet, lower legs, or face. In some cases, you may have swelling in more than one of these locations.  Heart palpitations or a racing  heartbeat.  Difficulty breathing while lying down.  Decrease in the amount of urine that you pass. Other rare signs and symptoms may include:  Sweating more than usual. This lasts longer than a few days after delivery.  Chest pain.  Sudden dizziness when you get up from sitting or lying down.  Seizures.  Nausea or vomiting.  Abdominal pain. How is this diagnosed? This condition may be diagnosed based on the results of a physical exam, blood pressure measurements, and blood and urine tests. You may also have other tests, such as a CT scan or an MRI, to check for other problems of postpartum hypertension. How is this treated? If blood pressure is high enough to require treatment, your options may include:  Medicines to reduce blood pressure (  antihypertensives). Tell your health care provider if you are breastfeeding or if you plan to breastfeed. There are many antihypertensive medicines that are safe to take while breastfeeding.  Stopping medicines that may be causing hypertension.  Treating medical conditions that are causing hypertension.  Treating the complications of hypertension, such as seizures, stroke, or kidney problems. Your health care provider will also continue to monitor your blood pressure closely until it is within a safe range for you. Follow these instructions at home:  Take over-the-counter and prescription medicines only as told by your health care provider.  Return to your normal activities as told by your health care provider. Ask your health care provider what activities are safe for you.  Do not use any products that contain nicotine or tobacco, such as cigarettes and e-cigarettes. If you need help quitting, ask your health care provider.  Keep all follow-up visits as told by your health care provider. This is important. Contact a health care provider if:  Your symptoms get worse.  You have new symptoms, such as: ? A headache that does not get  better. ? Dizziness. ? Visual changes. Get help right away if:  You suddenly develop swelling in your hands, ankles, or face.  You have sudden, rapid weight gain.  You develop difficulty breathing, chest pain, racing heartbeat, or heart palpitations.  You develop severe pain in your abdomen.  You have any symptoms of a stroke. "BE FAST" is an easy way to remember the main warning signs of a stroke: ? B - Balance. Signs are dizziness, sudden trouble walking, or loss of balance. ? E - Eyes. Signs are trouble seeing or a sudden change in vision. ? F - Face. Signs are sudden weakness or numbness of the face, or the face or eyelid drooping on one side. ? A - Arms. Signs are weakness or numbness in an arm. This happens suddenly and usually on one side of the body. ? S - Speech. Signs are sudden trouble speaking, slurred speech, or trouble understanding what people say. ? T - Time. Time to call emergency services. Write down what time symptoms started.  You have other signs of a stroke, such as: ? A sudden, severe headache with no known cause. ? Nausea or vomiting. ? Seizure. These symptoms may represent a serious problem that is an emergency. Do not wait to see if the symptoms will go away. Get medical help right away. Call your local emergency services (911 in the [place] Do not drive yourself to the hospital. Summary  Postpartum hypertension is high blood pressure that remains higher than normal after childbirth.  In most cases, postpartum hypertension will go away on its own, usually within a week of delivery.  For some women, medical treatment is required to prevent serious complications, such as seizures or stroke. This information is not intended to replace advice given to you by your health care provider. Make sure you discuss any questions you have with your health care provider. Document Revised: 02/05/2018 Document Reviewed: 10/20/2016 Elsevier Patient Education  2020 Elsevier  Inc.  

## 2019-04-25 LAB — CULTURE, BETA STREP (GROUP B ONLY): Strep Gp B Culture: NEGATIVE

## 2019-04-27 ENCOUNTER — Ambulatory Visit (HOSPITAL_COMMUNITY): Payer: Medicaid Other

## 2019-04-29 ENCOUNTER — Encounter: Payer: Medicaid Other | Admitting: Obstetrics & Gynecology

## 2019-05-03 ENCOUNTER — Ambulatory Visit: Payer: Medicaid Other

## 2019-05-05 ENCOUNTER — Encounter: Payer: Medicaid Other | Admitting: Obstetrics & Gynecology

## 2019-05-24 ENCOUNTER — Telehealth (INDEPENDENT_AMBULATORY_CARE_PROVIDER_SITE_OTHER): Payer: Medicaid Other | Admitting: Nurse Practitioner

## 2019-05-24 ENCOUNTER — Other Ambulatory Visit: Payer: Self-pay

## 2019-05-24 DIAGNOSIS — Z5329 Procedure and treatment not carried out because of patient's decision for other reasons: Secondary | ICD-10-CM

## 2019-05-24 NOTE — Progress Notes (Signed)
I connected with  Denise Gillespie on 05/24/19 at 1440 by telephone and verified that I am speaking with the correct person using two identifiers.   Pt states she forgot visit was today and is on the way to a pediatrician appt, asks to reschedule. Explained the front office will reach out with a new appt. Pt has no concerns at this time.  Marjo Bicker, RN 05/24/2019  2:40 PM   Chart reviewed for nurse visit. Agree with plan of care.   Currie Paris, NP 05/25/2019 8:05 AM

## 2019-06-06 ENCOUNTER — Telehealth: Payer: Self-pay | Admitting: Family Medicine

## 2019-06-06 ENCOUNTER — Other Ambulatory Visit: Payer: Medicaid Other

## 2019-06-06 NOTE — Telephone Encounter (Signed)
Patient missed her appointment for her 2 hour GTT. She has an appointment in the office this Thursday at 9:55 am. Left a voicemail message for her to call us. I have rescheduled this appointment for the same day as your postpartum visit.

## 2019-06-08 ENCOUNTER — Other Ambulatory Visit: Payer: Self-pay | Admitting: *Deleted

## 2019-06-08 DIAGNOSIS — Z8632 Personal history of gestational diabetes: Secondary | ICD-10-CM

## 2019-06-09 ENCOUNTER — Ambulatory Visit: Payer: Medicaid Other | Admitting: Obstetrics and Gynecology

## 2019-06-09 ENCOUNTER — Other Ambulatory Visit: Payer: Medicaid Other

## 2019-07-01 ENCOUNTER — Ambulatory Visit: Payer: Medicaid Other | Admitting: Family Medicine

## 2019-07-13 ENCOUNTER — Ambulatory Visit: Payer: Medicaid Other | Admitting: Family Medicine

## 2020-01-03 ENCOUNTER — Ambulatory Visit
Admission: EM | Admit: 2020-01-03 | Discharge: 2020-01-03 | Disposition: A | Discharge status: Home / Self Care | Payer: Medicaid Other | Attending: Emergency Medicine | Admitting: Emergency Medicine

## 2020-01-03 ENCOUNTER — Ambulatory Visit (INDEPENDENT_AMBULATORY_CARE_PROVIDER_SITE_OTHER): Payer: Medicaid Other

## 2020-01-03 ENCOUNTER — Other Ambulatory Visit: Payer: Self-pay

## 2020-01-03 DIAGNOSIS — W19XXXA Unspecified fall, initial encounter: Secondary | ICD-10-CM

## 2020-01-03 DIAGNOSIS — M25561 Pain in right knee: Secondary | ICD-10-CM

## 2020-01-03 DIAGNOSIS — S8991XA Unspecified injury of right lower leg, initial encounter: Secondary | ICD-10-CM

## 2020-01-03 DIAGNOSIS — M7989 Other specified soft tissue disorders: Secondary | ICD-10-CM | POA: Diagnosis not present

## 2020-01-03 MED ORDER — IBUPROFEN 800 MG PO TABS
800.0000 mg | ORAL_TABLET | Freq: Once | ORAL | Status: AC
  Administered 2020-01-03: 800 mg via ORAL

## 2020-01-03 MED ORDER — NAPROXEN 500 MG PO TABS: 500.0000 mg | ORAL_TABLET | Freq: Two times a day (BID) | ORAL | 0 refills | Status: DC

## 2020-01-03 NOTE — ED Triage Notes (Signed)
Pt states tripped over a step last night and landed on rt knee first as she was falling. States pain to bare weight to rt knee.

## 2020-01-03 NOTE — ED Provider Notes (Signed)
[name]    CSN: [id number] Arrival date & time: 01/03/20  1416      History   Chief Complaint Chief Complaint  Patient presents with  . Knee Pain    HPI Denise Gillespie is a 29 y.o. female presenting today for evaluation of right knee injury.  Reports last night tripped and fell onto concrete stairs.  Right knee broke fall with majority of her weight.  Since she has had pain swelling and difficulty bending her right knee.  Denies prior fractures.  HPI  Past Medical History:  Diagnosis Date  . Abscess    multiple  . Anemia, iron deficiency   . Depression   . Gestational diabetes   . Hidradenitis   . Obesity   . Pregnancy induced hypertension     Patient Active Problem List   Diagnosis Date Noted  . Pre-eclampsia, severe, delivered 04/21/2019  . Unwanted fertility 04/07/2019  . Maternal iron deficiency anemia complicating pregnancy in third trimester 03/28/2019  . GDM (gestational diabetes mellitus) 03/27/2019  . Alpha thalassemia trait 12/28/2018  . BMI 45.0-49.9, adult (HCC) 12/28/2018  . LGSIL on Pap smear of cervix 12/03/2018  . History of VBAC 11/30/2018  . Supervision of high risk pregnancy, antepartum 11/16/2018  . History of severe pre-eclampsia 11/16/2018  . Maternal morbid obesity, antepartum (HCC)   . Depression   . Hydradenitis 05/25/2017  . Tobacco use disorder 05/25/2017  . Current severe episode of major depressive disorder without psychotic features (HCC) 05/25/2017  . Generalized anxiety disorder 05/25/2017    Past Surgical History:  Procedure Laterality Date  . CESAREAN SECTION    . INCISE AND DRAIN ABCESS      OB History    Gravida  3   Para  3   Term  2   Preterm  1   AB      Living  3     SAB      IAB      Ectopic      Multiple  0   Live Births  3            Home Medications    Prior to Admission medications   Medication Sig Start Date End Date Taking? Authorizing Provider  naproxen  (NAPROSYN) 500 MG tablet Take 1 tablet (500 mg total) by mouth 2 (two) times daily. 01/03/20   Azhane Eckart C, PA-C  Prenatal Vit-Fe Fumarate-FA (MULTIVITAMIN-PRENATAL) 27-0.8 MG TABS tablet Take 1 tablet by mouth daily at 12 noon. 12/01/18   Marny Lowenstein, PA-C    Family History Family History  Problem Relation Age of Onset  . Hypertension Mother   . Migraines Mother   . Eczema Brother     Social History Social History   Tobacco Use  . Smoking status: Former Smoker    Packs/day: 0.25    Types: Cigarettes    Quit date: 09/18/2018    Years since quitting: 1.2  . Smokeless tobacco: Never Used  . Tobacco comment: quit when found out pregnant   Vaping Use  . Vaping Use: Never used  Substance Use Topics  . Alcohol use: Not Currently    Comment: Social  . Drug use: No     Allergies   Patient has no known allergies.   Review of Systems Review of Systems  Constitutional: Negative for fatigue and fever.  Eyes: Negative for visual disturbance.  Respiratory: Negative for shortness of breath.   Cardiovascular: Negative for chest pain.  Gastrointestinal: Negative for abdominal pain, nausea and vomiting.  Musculoskeletal: Positive for arthralgias, gait problem and joint swelling.  Skin: Positive for wound. Negative for color change and rash.  Neurological: Negative for dizziness, weakness, light-headedness and headaches.     Physical Exam Triage Vital Signs ED Triage Vitals  Enc Vitals Group     BP 01/03/20 1437 (!) 146/93     Pulse Rate 01/03/20 1437 80     Resp 01/03/20 1437 18     Temp 01/03/20 1437 98.5 F (36.9 C)     Temp Source 01/03/20 1437 Oral     SpO2 01/03/20 1437 98 %     Weight --      Height --      Head Circumference --      Peak Flow --      Pain Score 01/03/20 1449 10     Pain Loc --      Pain Edu? --      Excl. in GC? --    No data found.  Updated Vital Signs BP (!) 146/93 (BP Location: Left Arm)   Pulse 80   Temp 98.5 F (36.9 C)  (Oral)   Resp 18   LMP 12/11/2019   SpO2 98%   Breastfeeding No   Visual Acuity Right Eye Distance:   Left Eye Distance:   Bilateral Distance:    Right Eye Near:   Left Eye Near:    Bilateral Near:     Physical Exam Vitals and nursing note reviewed.  Constitutional:      Appearance: She is well-developed and well-nourished.     Comments: No acute distress  HENT:     Head: Normocephalic and atraumatic.     Nose: Nose normal.  Eyes:     Conjunctiva/sclera: Conjunctivae normal.  Cardiovascular:     Rate and Rhythm: Normal rate.  Pulmonary:     Effort: Pulmonary effort is normal. No respiratory distress.  Abdominal:     General: There is no distension.  Musculoskeletal:        General: Normal range of motion.     Cervical back: Neck supple.     Comments: Right knee: Superficial scabbing abrasion noted to inferolateral area of knee without surrounding erythema, diffuse tenderness over patella, infrapatellar area and medial lateral joint line, minimal tenderness to palpation of popliteal area, nontender suprapatellar area, limited flexion beyond approximately 90 degrees due to pain, full extension  Skin:    General: Skin is warm and dry.  Neurological:     Mental Status: She is alert and oriented to person, place, and time.  Psychiatric:        Mood and Affect: Mood and affect normal.      UC Treatments / Results  Labs (all labs ordered are listed, but only abnormal results are displayed) Labs Reviewed - No data to display  EKG   Radiology DG Knee Complete 4 Views Right  Result Date: 01/03/2020 CLINICAL DATA:  Larey Seat yesterday with anterior pain. EXAM: RIGHT KNEE - COMPLETE 4+ VIEW COMPARISON:  None. FINDINGS: Anterior soft tissue swelling. No visible joint effusion. No evidence of regional fracture. No degenerative changes. No focal findings. IMPRESSION: Anterior soft tissue swelling. No other finding. Electronically Signed   By: Paulina Fusi M.D.   On: 01/03/2020  15:27    Procedures Procedures (including critical care time)  Medications Ordered in UC Medications  ibuprofen (ADVIL) tablet 800 mg (800 mg Oral Given 01/03/20 1515)    Initial Impression /  Assessment and Plan / UC Course  I have reviewed the triage vital signs and the nursing notes.  Pertinent labs & imaging results that were available during my care of the patient were reviewed by me and considered in my medical decision making (see chart for details).     X-ray negative, treating as contusion/sprain, providing knee brace, ice elevate and anti-inflammatories.  Follow-up with sports medicine if not improving.Discussed strict return precautions. Patient verbalized understanding and is agreeable with plan.  Final Clinical Impressions(s) / UC Diagnoses   Final diagnoses:  Injury of right knee, initial encounter     Discharge Instructions     X-ray normal Ice and elevate Profen and Tylenol for pain or may use Naprosyn provided Wear knee brace for extra support Follow-up with sports medicine if knee not improving    ED Prescriptions    Medication Sig Dispense Auth. Provider   naproxen (NAPROSYN) 500 MG tablet Take 1 tablet (500 mg total) by mouth 2 (two) times daily. 30 tablet Jameel Quant, [place] C, PA-C     PDMP not reviewed this encounter.   Lew Dawes, [place] 01/03/20 1540

## 2020-01-03 NOTE — Discharge Instructions (Addendum)
X-ray normal Ice and elevate Profen and Tylenol for pain or may use Naprosyn provided Wear knee brace for extra support Follow-up with sports medicine if knee not improving

## 2020-02-06 DIAGNOSIS — Z13220 Encounter for screening for lipoid disorders: Secondary | ICD-10-CM | POA: Diagnosis not present

## 2020-02-06 DIAGNOSIS — Z114 Encounter for screening for human immunodeficiency virus [HIV]: Secondary | ICD-10-CM | POA: Diagnosis not present

## 2020-02-06 DIAGNOSIS — D539 Nutritional anemia, unspecified: Secondary | ICD-10-CM | POA: Diagnosis not present

## 2020-02-06 DIAGNOSIS — Z79899 Other long term (current) drug therapy: Secondary | ICD-10-CM | POA: Diagnosis not present

## 2020-02-06 DIAGNOSIS — E559 Vitamin D deficiency, unspecified: Secondary | ICD-10-CM | POA: Diagnosis not present

## 2020-02-06 DIAGNOSIS — Z1159 Encounter for screening for other viral diseases: Secondary | ICD-10-CM | POA: Diagnosis not present

## 2020-02-06 DIAGNOSIS — R5383 Other fatigue: Secondary | ICD-10-CM | POA: Diagnosis not present

## 2020-02-15 ENCOUNTER — Ambulatory Visit: Payer: Medicaid Other | Admitting: Podiatry

## 2020-02-15 ENCOUNTER — Ambulatory Visit (INDEPENDENT_AMBULATORY_CARE_PROVIDER_SITE_OTHER): Payer: Medicaid Other | Admitting: Podiatry

## 2020-02-15 ENCOUNTER — Encounter: Payer: Self-pay | Admitting: Podiatry

## 2020-02-15 ENCOUNTER — Other Ambulatory Visit: Payer: Self-pay

## 2020-02-15 DIAGNOSIS — B353 Tinea pedis: Secondary | ICD-10-CM | POA: Diagnosis not present

## 2020-02-15 MED ORDER — TERBINAFINE HCL 250 MG PO TABS: 250.0000 mg | ORAL_TABLET | Freq: Every day | ORAL | 0 refills | Status: DC

## 2020-02-16 NOTE — Progress Notes (Signed)
Subjective:   Patient ID: Denise Gillespie, female   DOB: 30 y.o.   MRN: [id number]   HPI Patient presents with a lot of itching with breakdown of tissue in the interspace areas bilateral with extreme obesity is complicating factor.  Patient does try to be active but is not currently does not smoke   Review of Systems  All other systems reviewed and are negative.       Objective:  Physical Exam Vitals and nursing note reviewed.  Constitutional:      Appearance: She is well-developed and well-nourished.  Cardiovascular:     Pulses: Intact distal pulses.  Pulmonary:     Effort: Pulmonary effort is normal.  Musculoskeletal:        General: Normal range of motion.  Skin:    General: Skin is warm.  Neurological:     Mental Status: She is alert.     Neurovascular status was found to be intact muscle strength was found to be adequate with patient noted to have significant dry skin and interspace breakdown 1 through 4 bilateral.  There is no other pathology except for the obesity which is certainly a complicating factor to condition     Assessment:  Probability that were dealing with significant fungal infection with nails also involved beyond skin with obesity complicating factor     Plan:  H&P reviewed condition.  At this point I do think the only option we have got is antifungal therapy and I did discuss this with the patient and I reviewed antifungal treatment.  Patient will start Lamisil I explained risk we are getting blood work to check liver function with encouragement to call us if any issues were to occur and that hopefully this will make a big difference and if not will need to see dermatologist

## 2020-02-25 DIAGNOSIS — Z79899 Other long term (current) drug therapy: Secondary | ICD-10-CM | POA: Diagnosis not present

## 2020-04-10 DIAGNOSIS — L732 Hidradenitis suppurativa: Secondary | ICD-10-CM | POA: Diagnosis not present

## 2020-06-08 ENCOUNTER — Ambulatory Visit (INDEPENDENT_AMBULATORY_CARE_PROVIDER_SITE_OTHER): Payer: Medicaid Other | Admitting: Podiatry

## 2020-06-08 ENCOUNTER — Other Ambulatory Visit: Payer: Self-pay

## 2020-06-08 ENCOUNTER — Encounter: Payer: Self-pay | Admitting: Podiatry

## 2020-06-08 DIAGNOSIS — L0889 Other specified local infections of the skin and subcutaneous tissue: Secondary | ICD-10-CM | POA: Diagnosis not present

## 2020-06-08 DIAGNOSIS — B353 Tinea pedis: Secondary | ICD-10-CM

## 2020-06-08 MED ORDER — PREDNISONE 10 MG PO TABS: ORAL_TABLET | ORAL | 0 refills | Status: DC

## 2020-06-08 NOTE — Progress Notes (Signed)
Subjective:   Patient ID: Denise Gillespie, female   DOB: 30 y.o.   MRN: [id number]   HPI Patient presents with significant breakdown of tissue and irritated tissue between all toes of both feet that did not respond to oral antifungal that we have placed her on 3 months ago   ROS      Objective:  Physical Exam  Very irritated skin between all toes with obesity is complicating factor localized with no indications of systemic involvement or proximal infection     Assessment:  Frustration that its not responding to oral antifungal cannot rule out that there may not be other skin condition with inflammatory component     Plan:  H&P discussed that this does appear to be something more involved than fungus and I am referring to dermatology and I went ahead and placed on a Sterapred DS Dosepak with instructions on usage along with Epson salt soaks.  Reappoint but will see a dermatologist at this time

## 2020-06-11 DIAGNOSIS — L732 Hidradenitis suppurativa: Secondary | ICD-10-CM | POA: Diagnosis not present

## 2020-06-11 DIAGNOSIS — L309 Dermatitis, unspecified: Secondary | ICD-10-CM | POA: Diagnosis not present

## 2020-06-11 DIAGNOSIS — L209 Atopic dermatitis, unspecified: Secondary | ICD-10-CM | POA: Diagnosis not present

## 2020-09-09 ENCOUNTER — Ambulatory Visit
Admission: EM | Admit: 2020-09-09 | Discharge: 2020-09-09 | Disposition: A | Discharge status: Home / Self Care | Payer: Medicaid Other | Attending: Internal Medicine | Admitting: Internal Medicine

## 2020-09-09 ENCOUNTER — Other Ambulatory Visit: Payer: Self-pay

## 2020-09-09 DIAGNOSIS — J069 Acute upper respiratory infection, unspecified: Secondary | ICD-10-CM | POA: Diagnosis not present

## 2020-09-09 DIAGNOSIS — J029 Acute pharyngitis, unspecified: Secondary | ICD-10-CM | POA: Diagnosis not present

## 2020-09-09 LAB — POCT RAPID STREP A (OFFICE): Rapid Strep A Screen: NEGATIVE

## 2020-09-09 MED ORDER — AMOXICILLIN 875 MG PO TABS: 875.0000 mg | ORAL_TABLET | Freq: Two times a day (BID) | ORAL | 0 refills | Status: AC

## 2020-09-09 NOTE — ED Provider Notes (Signed)
[name]    CSN: [id number] Arrival date & time: 09/09/20  1330      History   Chief Complaint Chief Complaint  Patient presents with   Cough   Nasal Congestion   Generalized Body Aches    HPI Denise Gillespie is a 30 y.o. female.   Patient presents with 4 day history of cough, nasal congestion, body aches, severe sore throat, nausea with vomiting, diarrhea. Denies any known fevers. Children have same symptoms. Has taken tylenol, throat lozenges, and nyquil with no improvement in symptoms. Denies any chest pain or shortness of breath. Patient also has elevated blood pressure reading on exam. States that she has history of HTN in pregnancy but no longer takes medication for it. Denies any headaches, blurred vision, dizziness.    Cough  Past Medical History:  Diagnosis Date   Abscess    multiple   Anemia, iron deficiency    Depression    Gestational diabetes    Hidradenitis    Obesity    Pregnancy induced hypertension     Patient Active Problem List   Diagnosis Date Noted   Pre-eclampsia, severe, delivered 04/21/2019   Unwanted fertility 04/07/2019   Maternal iron deficiency anemia complicating pregnancy in third trimester 03/28/2019   GDM (gestational diabetes mellitus) 03/27/2019   Alpha thalassemia trait 12/28/2018   BMI 45.0-49.9, adult (HCC) 12/28/2018   LGSIL on Pap smear of cervix 12/03/2018   History of VBAC 11/30/2018   Supervision of high risk pregnancy, antepartum 11/16/2018   History of severe pre-eclampsia 11/16/2018   Maternal morbid obesity, antepartum (HCC)    Depression    Hydradenitis 05/25/2017   Tobacco use disorder 05/25/2017   Current severe episode of major depressive disorder without psychotic features (HCC) 05/25/2017   Generalized anxiety disorder 05/25/2017    Past Surgical History:  Procedure Laterality Date   CESAREAN SECTION     INCISE AND DRAIN ABCESS      OB History     Gravida  3   Para  3   Term   2   Preterm  1   AB      Living  3      SAB      IAB      Ectopic      Multiple  0   Live Births  3            Home Medications    Prior to Admission medications   Medication Sig Start Date End Date Taking? Authorizing Provider  amoxicillin (AMOXIL) 875 MG tablet Take 1 tablet (875 mg total) by mouth 2 (two) times daily for 7 days. 09/09/20 09/16/20 Yes Lance Muss, FNP  naproxen (NAPROSYN) 500 MG tablet Take 1 tablet (500 mg total) by mouth 2 (two) times daily. 01/03/20   Wieters, Hallie C, PA-C  predniSONE (DELTASONE) 10 MG tablet 12 day tapering dose 06/08/20   Lenn Sink, DPM  Prenatal Vit-Fe Fumarate-FA (MULTIVITAMIN-PRENATAL) 27-0.8 MG TABS tablet Take 1 tablet by mouth daily at 12 noon. 12/01/18   Marny Lowenstein, PA-C  terbinafine (LAMISIL) 250 MG tablet Take 1 tablet (250 mg total) by mouth daily. 02/15/20   Lenn Sink, DPM    Family History Family History  Problem Relation Age of Onset   Hypertension Mother    Migraines Mother    Eczema Brother     Social History Social History   Tobacco Use   Smoking status: Former  Packs/day: 0.25    Types: Cigarettes    Quit date: 09/18/2018    Years since quitting: 1.9   Smokeless tobacco: Never   Tobacco comments:    quit when found out pregnant   Vaping Use   Vaping Use: Never used  Substance Use Topics   Alcohol use: Not Currently    Comment: Social   Drug use: No     Allergies   Patient has no known allergies.   Review of Systems Review of Systems Per HPI  Physical Exam Triage Vital Signs ED Triage Vitals  Enc Vitals Group     BP 09/09/20 1451 (!) 162/103     Pulse Rate 09/09/20 1451 89     Resp 09/09/20 1451 18     Temp 09/09/20 1451 97.9 F (36.6 C)     Temp Source 09/09/20 1451 Oral     SpO2 09/09/20 1451 97 %     Weight --      Height --      Head Circumference --      Peak Flow --      Pain Score 09/09/20 1454 7     Pain Loc --      Pain Edu? --      Excl. in  GC? --    No data found.  Updated Vital Signs BP (!) 162/103 (BP Location: Left Arm)   Pulse 89   Temp 97.9 F (36.6 C) (Oral)   Resp 18   LMP 09/02/2020 (Exact Date)   SpO2 97%   Breastfeeding No   Visual Acuity Right Eye Distance:   Left Eye Distance:   Bilateral Distance:    Right Eye Near:   Left Eye Near:    Bilateral Near:     Physical Exam Constitutional:      General: She is not in acute distress.    Appearance: Normal appearance.  HENT:     Head: Normocephalic and atraumatic.     Right Ear: Tympanic membrane and ear canal normal.     Left Ear: Tympanic membrane and ear canal normal.     Nose: Congestion present.     Mouth/Throat:     Mouth: Mucous membranes are moist.     Pharynx: Posterior oropharyngeal erythema present.  Eyes:     Extraocular Movements: Extraocular movements intact.     Conjunctiva/sclera: Conjunctivae normal.     Pupils: Pupils are equal, round, and reactive to light.  Cardiovascular:     Rate and Rhythm: Normal rate and regular rhythm.     Pulses: Normal pulses.     Heart sounds: Normal heart sounds.  Pulmonary:     Effort: Pulmonary effort is normal. No respiratory distress.     Breath sounds: Normal breath sounds. No wheezing.  Abdominal:     General: Abdomen is flat. Bowel sounds are normal.     Palpations: Abdomen is soft.  Musculoskeletal:        General: Normal range of motion.     Cervical back: Normal range of motion.  Skin:    General: Skin is warm and dry.  Neurological:     General: No focal deficit present.     Mental Status: She is alert and oriented to person, place, and time. Mental status is at baseline.  Psychiatric:        Mood and Affect: Mood normal.        Behavior: Behavior normal.     UC Treatments / Results  Labs (all labs  ordered are listed, but only abnormal results are displayed) Labs Reviewed  CULTURE, GROUP A STREP ([facility])  NOVEL CORONAVIRUS, NAA  POCT RAPID STREP A (OFFICE)     EKG   Radiology No results found.  Procedures Procedures (including critical care time)  Medications Ordered in UC Medications - No data to display  Initial Impression / Assessment and Plan / UC Course  I have reviewed the triage vital signs and the nursing notes.  Pertinent labs & imaging results that were available during my care of the patient were reviewed by me and considered in my medical decision making (see chart for details).     Rapid strep test was negative but suspect other variation of strep due to appearance of posterior pharynx on exam and patient's report of severe sore throat that is refractory to over-the-counter remedies.  Will treat with amoxicillin antibiotic.  Throat culture and COVID-19 viral swab are pending.  Discussed over-the-counter medications to alleviate symptoms.  Also advised patient to obtain home blood pressure cuff and monitor blood pressure twice daily.  Patient to follow-up with PCP for further evaluation and management of blood pressure.  No red flags at this time for blood pressure.  No signs of hypertensive urgency.Discussed strict return precautions. Patient verbalized understanding and is agreeable with plan.  Final Clinical Impressions(s) / UC Diagnoses   Final diagnoses:  Acute upper respiratory infection  Sore throat     Discharge Instructions      You are being treated for suspicion of strep throat with amoxicillin antibiotic.  Your throat culture and COVID-19 viral swab are pending. We will call if these are positive.      ED Prescriptions     Medication Sig Dispense Auth. Provider   amoxicillin (AMOXIL) 875 MG tablet Take 1 tablet (875 mg total) by mouth 2 (two) times daily for 7 days. 14 tablet Lance Muss, FNP      PDMP not reviewed this encounter.   Lance Muss, FNP 09/09/20 [phone]

## 2020-09-09 NOTE — ED Triage Notes (Signed)
Four day h/o cough, congestion, body aches and sore throat with dysphagia. Has been taking lozenges, Tylenol and NyQuil with temporary relief. Confirms diarrhea and abdominal pain for one day. Emesis x1. Children are sick

## 2020-09-09 NOTE — Discharge Instructions (Addendum)
You are being treated for suspicion of strep throat with amoxicillin antibiotic.  Your throat culture and COVID-19 viral swab are pending. We will call if these are positive.

## 2020-09-10 LAB — SARS-COV-2, NAA 2 DAY TAT

## 2020-09-10 LAB — NOVEL CORONAVIRUS, NAA: SARS-CoV-2, NAA: DETECTED — AB

## 2020-09-13 LAB — CULTURE, GROUP A STREP (THRC)

## 2020-09-30 ENCOUNTER — Telehealth: Payer: Self-pay

## 2020-09-30 NOTE — Telephone Encounter (Signed)
Transition Care Management Unsuccessful Follow-up Telephone Call  Date of discharge and from where:  09/29/2020 from [facility]  Attempts:  1st Attempt  Reason for unsuccessful TCM follow-up call:  Left voice message

## 2020-10-02 ENCOUNTER — Ambulatory Visit: Admit: 2020-10-02 | Discharge status: Against Medical Advice | Payer: Medicaid Other

## 2020-10-02 DIAGNOSIS — Z20822 Contact with and (suspected) exposure to covid-19: Secondary | ICD-10-CM | POA: Diagnosis not present

## 2020-10-02 NOTE — Telephone Encounter (Signed)
Transition Care Management Unsuccessful Follow-up Telephone Call  Date of discharge and from where:  09/29/2020 from [facility]  Attempts:  2nd Attempt  Reason for unsuccessful TCM follow-up call:  Left voice message

## 2020-10-03 ENCOUNTER — Ambulatory Visit: Payer: Self-pay

## 2020-10-04 NOTE — Telephone Encounter (Signed)
Transition Care Management Unsuccessful Follow-up Telephone Call  Date of discharge and from where:  09/29/2020 from [facility]  Attempts:  3rd Attempt  Reason for unsuccessful TCM follow-up call:  Unable to reach patient

## 2020-10-22 ENCOUNTER — Ambulatory Visit: Payer: Medicaid Other

## 2020-10-23 ENCOUNTER — Other Ambulatory Visit: Payer: Self-pay

## 2020-10-23 ENCOUNTER — Ambulatory Visit (INDEPENDENT_AMBULATORY_CARE_PROVIDER_SITE_OTHER): Payer: Medicaid Other | Admitting: Family Medicine

## 2020-10-23 DIAGNOSIS — N6452 Nipple discharge: Secondary | ICD-10-CM | POA: Insufficient documentation

## 2020-10-23 MED ORDER — CEPHALEXIN 500 MG PO CAPS
500.0000 mg | ORAL_CAPSULE | Freq: Four times a day (QID) | ORAL | 0 refills | Status: AC
Start: 2020-10-23 — End: 2020-11-02

## 2020-10-23 NOTE — Patient Instructions (Signed)
It was wonderful to see you today.  Please bring ALL of your medications with you to every visit.   Today we talked about:  - Take keflex 500mg  4x a day for 10 days - We will schedule a breast ultrasound and call you with date and time = Make a follow up appt in 2 weeks to discuss further if not resolving   Thank you for choosing [facility].   Please call [phone] with any questions about today's appointment.  Please be sure to schedule follow up at the front  desk before you leave today.   [phone], MD  Family Medicine

## 2020-10-23 NOTE — Progress Notes (Signed)
    SUBJECTIVE:   CHIEF COMPLAINT / HPI:   Breast swelling and discharge-patient notes a 3-week history of warmth and discharge in her right breast.  First she notes she was having nipple discharge with white pus, no blood, and then notes her nipple started to be swollen.  She has not been breast-feeding and does not have any piercings or trauma to the breast.  She now notes the swelling is starting to go down but it is still feeling warm, and yesterday a little boil or pimple showed up on the side of her nipple and popped with white pus and a small amount of blood.  She does note that she has hidradenitis suppurativa and has some lesions under her right breast that she has bandages on to keep dry, she denies any lesions on the left side.  She has not had any history of breast masses and does not have a family history of breast cancer.  No systemic symptoms.  PERTINENT  PMH / PSH: Hidradenitis suppurativa  OBJECTIVE:   BP (!) 133/98   Pulse 96   Wt 280 lb 6.4 oz (127.2 kg)   SpO2 99%   BMI 52.98 kg/m   Right breast: no masses or axillary lymphadenopathy, under right breast 2 bandages covering hidradenitis lesions.  On the right nipple at the 9 o'clock position is open wound, unable to express drainage for me today, nipple is swollen compared to left side and has induration all around nipple, minimal warmth, no erythema appreciated on my exam today.  No palpable fluctuance.  No other breast masses palpated on breast exam. Left breast-normal in appearance no nipple swelling no masses palpated  Deseree Blount CMA present as chaperone  ASSESSMENT/PLAN:   Discharge from breast - Suspect mastitis based on physical exam, no palpable fluctuance but cannot rule out abscess - Patient already taking doxycycline twice daily for history of hidradenitis suppurativa, will do a course of Keflex 4 times daily for 10 days - We will schedule right breast ultrasound at end of antibiotic course to assess and  rule out further abscess or mass - Discussed return precautions including fevers, chills, nausea vomiting unable to tolerate antibiotics, worsening swelling - Discussed importance of follow-up appointment in 2 weeks to reassess     Billey Co, MD [facility]

## 2020-10-23 NOTE — Assessment & Plan Note (Signed)
-   Suspect mastitis based on physical exam, no palpable fluctuance but cannot rule out abscess - Patient already taking doxycycline twice daily for history of hidradenitis suppurativa, will do a course of Keflex 4 times daily for 10 days - We will schedule right breast ultrasound at end of antibiotic course to assess and rule out further abscess or mass - Discussed return precautions including fevers, chills, nausea vomiting unable to tolerate antibiotics, worsening swelling - Discussed importance of follow-up appointment in 2 weeks to reassess

## 2020-11-06 ENCOUNTER — Other Ambulatory Visit: Payer: Self-pay | Admitting: Family Medicine

## 2020-11-06 DIAGNOSIS — N6452 Nipple discharge: Secondary | ICD-10-CM

## 2020-11-07 ENCOUNTER — Other Ambulatory Visit: Payer: Self-pay | Admitting: Family Medicine

## 2020-11-07 ENCOUNTER — Other Ambulatory Visit: Payer: Self-pay

## 2020-11-07 ENCOUNTER — Ambulatory Visit
Admission: RE | Admit: 2020-11-07 | Discharge: 2020-11-07 | Disposition: A | Discharge status: Home / Self Care | Payer: Medicaid Other | Source: Ambulatory Visit | Attending: Family Medicine | Admitting: Family Medicine

## 2020-11-07 DIAGNOSIS — R928 Other abnormal and inconclusive findings on diagnostic imaging of breast: Secondary | ICD-10-CM | POA: Diagnosis not present

## 2020-11-07 DIAGNOSIS — N6452 Nipple discharge: Secondary | ICD-10-CM

## 2020-11-07 DIAGNOSIS — N6489 Other specified disorders of breast: Secondary | ICD-10-CM | POA: Diagnosis not present

## 2020-11-08 ENCOUNTER — Other Ambulatory Visit: Payer: Medicaid Other

## 2020-11-23 ENCOUNTER — Inpatient Hospital Stay: Admission: RE | Admit: 2020-11-23 | Payer: Medicaid Other | Source: Ambulatory Visit

## 2021-06-18 ENCOUNTER — Encounter: Payer: Self-pay | Admitting: *Deleted

## 2021-06-20 ENCOUNTER — Ambulatory Visit (INDEPENDENT_AMBULATORY_CARE_PROVIDER_SITE_OTHER): Payer: Medicaid Other | Admitting: Student

## 2021-06-20 ENCOUNTER — Encounter: Payer: Self-pay | Admitting: Student

## 2021-06-20 VITALS — BP 124/82 | HR 74 | Ht 61.0 in | Wt 272.0 lb

## 2021-06-20 DIAGNOSIS — Z3490 Encounter for supervision of normal pregnancy, unspecified, unspecified trimester: Secondary | ICD-10-CM

## 2021-06-20 DIAGNOSIS — Z3491 Encounter for supervision of normal pregnancy, unspecified, first trimester: Secondary | ICD-10-CM | POA: Diagnosis not present

## 2021-06-20 LAB — POCT URINE PREGNANCY: Preg Test, Ur: POSITIVE — AB

## 2021-06-20 MED ORDER — DOXYLAMINE-PYRIDOXINE 10-10 MG PO TBEC: DELAYED_RELEASE_TABLET | ORAL | 3 refills | Status: DC

## 2021-06-20 MED ORDER — PRENATAL MULTIVITAMIN CH: 1.0000 | ORAL_TABLET | Freq: Every day | ORAL | 2 refills | Status: DC

## 2021-06-20 NOTE — Progress Notes (Signed)
  SUBJECTIVE:   CHIEF COMPLAINT / HPI:   Positive pregnancy test: LMP 05/18/2021 however patient had spotting on 6/2 so she is not exactly sure when her last period was.  States she has regular periods and has not been on birth control recently.  She tested positive on 6 home pregnancy tests.  Denies any pain or spotting otherwise.  Endorses morning sickness daily and has not been taking a prenatal vitamin.  She has had 3 prior pregnancies and in her last pregnancy had gestational diabetes and hypertension.  She does plan to keep the baby.  Requests care at [facility] where she had care prior.  PERTINENT  PMH / PSH: gDM, gHTN  OBJECTIVE:  BP 124/82   Pulse 74   Ht 5\' 1"  (1.549 m)   Wt 272 lb (123.4 kg)   LMP 05/18/2021   SpO2 98%   BMI 51.39 kg/m   General: NAD, pleasant, able to participate in exam Cardiac: RRR, no murmurs auscultated. Respiratory: CTAB, normal effort, no wheezes, rales or rhonchi Abdomen: soft, non-tender, obese abdomen, normoactive bowel sounds Psych: Normal affect and mood  ASSESSMENT/PLAN:  First trimester pregnancy Positive pregnancy test at home and during encounter.  Unknown LMP.  Prescribed Diclegis and PNV.  Initial OB labs obtained today and scheduled for dating ultrasound.  Referral sent to [facility] and advised patient to schedule with [facility] for initial OB visit.   Orders Placed This Encounter  Procedures   [facility] OB Comp Less 14 Wks    Standing Status:   Future    Standing Expiration Date:   06/21/2022    Order Specific Question:   Reason for Exam (SYMPTOM  OR DIAGNOSIS REQUIRED)    Answer:   unknown LMP    Order Specific Question:   Preferred Imaging Location?    Answer:   [facility]   Obstetric Panel, Including HIV([facility])   CBC/D/Plt+RPR+Rh+ABO+RubIgG...   Ambulatory referral to Obstetrics / Gynecology    Referral Priority:   Routine    Referral Type:   Consultation    Referral Reason:   Specialty Services Required    Requested Specialty:   Obstetrics  and Gynecology    Number of Visits Requested:   1   POCT urine pregnancy   Meds ordered this encounter  Medications   Doxylamine-Pyridoxine (DICLEGIS) 10-10 MG TBEC    Sig: 2 tablets orally at bedtime on day 1 and 2; if symptoms persist, take 1 tablet in morning and 2 tablets at bedtime on day 3; if symptoms persist, may increase to MAX 4 tablets per day    Dispense:  90 tablet    Refill:  3   Prenatal Vit-Fe Fumarate-FA (PRENATAL MULTIVITAMIN) TABS tablet    Sig: Take 1 tablet by mouth daily at 12 noon.    Dispense:  90 tablet    Refill:  2   No follow-ups on file. ST. JOHN OWASSO, DO 06/20/2021, 11:44 AM PGY-1, [facility]

## 2021-06-20 NOTE — Assessment & Plan Note (Signed)
Positive pregnancy test at home and during encounter.  Unknown LMP.  Prescribed Diclegis and PNV.  Initial OB labs obtained today and scheduled for dating ultrasound.  Referral sent to [facility] and advised patient to schedule with [facility] for initial OB visit.

## 2021-06-20 NOTE — Patient Instructions (Signed)
It was great to see you today! Thank you for choosing [facility] for your primary care. Denise Gillespie was seen for pregnancy.  Today we addressed: Pregnancy test was positive.  I prescribed you a medication to help with your morning sickness and prenatal vitamins.  We have scheduled you for an ultrasound to determine your dating.  I have sent a referral to OB/GYN for your next appointment but please call [facility] to get your appointment set up.  Orders Placed This Encounter  Procedures   [facility] OB Comp Less 14 Wks    Standing Status:   Future    Standing Expiration Date:   06/21/2022    Order Specific Question:   Reason for Exam (SYMPTOM  OR DIAGNOSIS REQUIRED)    Answer:   unknown LMP    Order Specific Question:   Preferred Imaging Location?    Answer:   [facility]   Obstetric Panel, Including HIV([facility])   CBC/D/Plt+RPR+Rh+ABO+RubIgG...   Ambulatory referral to Obstetrics / Gynecology    Referral Priority:   Routine    Referral Type:   Consultation    Referral Reason:   Specialty Services Required    Requested Specialty:   Obstetrics and Gynecology    Number of Visits Requested:   1   POCT urine pregnancy   Meds ordered this encounter  Medications   Doxylamine-Pyridoxine (DICLEGIS) 10-10 MG TBEC    Sig: 2 tablets orally at bedtime on day 1 and 2; if symptoms persist, take 1 tablet in morning and 2 tablets at bedtime on day 3; if symptoms persist, may increase to MAX 4 tablets per day    Dispense:  90 tablet    Refill:  3   Prenatal Vit-Fe Fumarate-FA (PRENATAL MULTIVITAMIN) TABS tablet    Sig: Take 1 tablet by mouth daily at 12 noon.    Dispense:  90 tablet    Refill:  2    If you haven't already, sign up for My Chart to have easy access to your labs results, and communication with your primary care physician.  We are checking some labs today. If they are abnormal, I will call you. If they are normal, I will send you a MyChart message (if it is  active) or a letter in the mail. If you do not hear about your labs in the next 2 weeks, please call the office.   You should return to our clinic No follow-ups on file.  I recommend that you always bring your medications to each appointment as this makes it easy to ensure you are on the correct medications and helps [place] not miss refills when you need them.  Please arrive 15 minutes before your appointment to ensure smooth check in process.  We appreciate your efforts in making this happen.  Please call the clinic at [phone] if your symptoms worsen or you have any concerns.  Thank you for allowing me to participate in your care, Shelby Mattocks, DO 06/20/2021, 10:03 AM PGY-1, [facility]

## 2021-06-21 ENCOUNTER — Ambulatory Visit: Payer: Medicaid Other | Admitting: Family Medicine

## 2021-06-24 LAB — PREGNANCY, INITIAL SCREEN
Antibody Screen: NEGATIVE
Basophils Absolute: 0 10*3/uL (ref 0.0–0.2)
Basos: 0 %
Bilirubin, UA: NEGATIVE
Chlamydia trachomatis, NAA: NEGATIVE
EOS (ABSOLUTE): 0.1 10*3/uL (ref 0.0–0.4)
Eos: 1 %
Glucose, UA: NEGATIVE
HCV Ab: NONREACTIVE
HIV Screen 4th Generation wRfx: NONREACTIVE
Hematocrit: 35.4 % (ref 34.0–46.6)
Hemoglobin: 11.3 g/dL (ref 11.1–15.9)
Hepatitis B Surface Ag: NEGATIVE
Immature Grans (Abs): 0 10*3/uL (ref 0.0–0.1)
Immature Granulocytes: 0 %
Ketones, UA: NEGATIVE
Leukocytes,UA: NEGATIVE
Lymphocytes Absolute: 1.8 10*3/uL (ref 0.7–3.1)
Lymphs: 27 %
MCH: 25.8 pg — ABNORMAL LOW (ref 26.6–33.0)
MCHC: 31.9 g/dL (ref 31.5–35.7)
MCV: 81 fL (ref 79–97)
Monocytes Absolute: 0.4 10*3/uL (ref 0.1–0.9)
Monocytes: 6 %
Neisseria Gonorrhoeae by PCR: NEGATIVE
Neutrophils Absolute: 4.4 10*3/uL (ref 1.4–7.0)
Neutrophils: 66 %
Nitrite, UA: NEGATIVE
Platelets: 324 10*3/uL (ref 150–450)
Protein,UA: NEGATIVE
RBC, UA: NEGATIVE
RBC: 4.38 x10E6/uL (ref 3.77–5.28)
RDW: 14.8 % (ref 11.7–15.4)
RPR Ser Ql: NONREACTIVE
Rh Factor: POSITIVE
Rubella Antibodies, IGG: 1.66 index (ref >0.99)
Specific Gravity, UA: 1.008 (ref 1.005–1.030)
Urobilinogen, Ur: 0.2 mg/dL (ref 0.2–1.0)
WBC: 6.7 10*3/uL (ref 3.4–10.8)
pH, UA: 7 (ref 5.0–7.5)

## 2021-06-24 LAB — MICROSCOPIC EXAMINATION
Bacteria, UA: NONE SEEN
Casts: NONE SEEN /lpf
RBC, Urine: NONE SEEN /hpf (ref 0–2)
WBC, UA: NONE SEEN /hpf (ref 0–5)

## 2021-06-24 LAB — URINE CULTURE, OB REFLEX

## 2021-06-24 LAB — HCV INTERPRETATION

## 2021-07-02 ENCOUNTER — Ambulatory Visit
Admission: RE | Admit: 2021-07-02 | Discharge: 2021-07-02 | Disposition: A | Discharge status: Home / Self Care | Payer: Medicaid Other | Source: Ambulatory Visit | Attending: Family Medicine | Admitting: Family Medicine

## 2021-07-02 ENCOUNTER — Other Ambulatory Visit: Payer: Self-pay | Admitting: Family Medicine

## 2021-07-02 DIAGNOSIS — O26891 Other specified pregnancy related conditions, first trimester: Secondary | ICD-10-CM | POA: Diagnosis not present

## 2021-07-02 DIAGNOSIS — Z3491 Encounter for supervision of normal pregnancy, unspecified, first trimester: Secondary | ICD-10-CM | POA: Insufficient documentation

## 2021-07-02 DIAGNOSIS — O3481 Maternal care for other abnormalities of pelvic organs, first trimester: Secondary | ICD-10-CM | POA: Diagnosis not present

## 2021-07-02 DIAGNOSIS — O208 Other hemorrhage in early pregnancy: Secondary | ICD-10-CM | POA: Diagnosis not present

## 2021-07-02 DIAGNOSIS — N8312 Corpus luteum cyst of left ovary: Secondary | ICD-10-CM | POA: Insufficient documentation

## 2021-07-02 DIAGNOSIS — Z3689 Encounter for other specified antenatal screening: Secondary | ICD-10-CM | POA: Insufficient documentation

## 2021-07-02 DIAGNOSIS — Z3A01 Less than 8 weeks gestation of pregnancy: Secondary | ICD-10-CM | POA: Insufficient documentation

## 2021-07-02 DIAGNOSIS — N83202 Unspecified ovarian cyst, left side: Secondary | ICD-10-CM | POA: Diagnosis not present

## 2021-08-07 ENCOUNTER — Encounter: Payer: Medicaid Other | Admitting: Obstetrics and Gynecology

## 2022-04-04 ENCOUNTER — Ambulatory Visit: Payer: Medicaid Other | Admitting: Family Medicine

## 2022-04-15 ENCOUNTER — Ambulatory Visit: Payer: Medicaid Other | Admitting: Family Medicine

## 2022-04-18 ENCOUNTER — Ambulatory Visit: Payer: Medicaid Other | Admitting: Student

## 2022-05-01 NOTE — Progress Notes (Deleted)
    SUBJECTIVE:   CHIEF COMPLAINT / HPI:   STI Testing  **due for pap**- h/o LSIL, was supposed to have colpo postpartum in 2021 but never did?  PERTINENT  PMH / PSH: ***  OBJECTIVE:   LMP 05/18/2021   ***  ASSESSMENT/PLAN:   No problem-specific Assessment & Plan notes found for this encounter.     Maury Dus, MD [facility]

## 2022-05-02 ENCOUNTER — Ambulatory Visit: Payer: Medicaid Other | Admitting: Family Medicine

## 2022-05-16 ENCOUNTER — Encounter: Payer: Self-pay | Admitting: Family Medicine

## 2022-05-16 ENCOUNTER — Ambulatory Visit (INDEPENDENT_AMBULATORY_CARE_PROVIDER_SITE_OTHER): Payer: Medicaid Other | Admitting: Family Medicine

## 2022-05-16 VITALS — BP 148/90 | HR 87 | Wt 286.2 lb

## 2022-05-16 DIAGNOSIS — L732 Hidradenitis suppurativa: Secondary | ICD-10-CM | POA: Diagnosis present

## 2022-05-16 DIAGNOSIS — Z113 Encounter for screening for infections with a predominantly sexual mode of transmission: Secondary | ICD-10-CM | POA: Diagnosis not present

## 2022-05-16 DIAGNOSIS — Z309 Encounter for contraceptive management, unspecified: Secondary | ICD-10-CM | POA: Diagnosis not present

## 2022-05-16 DIAGNOSIS — R030 Elevated blood-pressure reading, without diagnosis of hypertension: Secondary | ICD-10-CM

## 2022-05-16 DIAGNOSIS — F339 Major depressive disorder, recurrent, unspecified: Secondary | ICD-10-CM

## 2022-05-16 LAB — POCT URINE PREGNANCY: Preg Test, Ur: NEGATIVE

## 2022-05-16 NOTE — Progress Notes (Unsigned)
    SUBJECTIVE:   CHIEF COMPLAINT / HPI:   STI Testing Patient requesting blood work to screen for STIs.  Likes to get checked periodically--no new partners, known exposures, or symptoms. Just [facility] but states they did not do blood work.  Contraception management Not currently using contraception.  Does not desire pregnancy at this time. Had Nexplanon in the past but would like to hear about other options as well. LMP 4/10.  Discuss FMLA Patient inquiring if she could have FMLA paperwork completed due to HS. Has episodic flares with large boils that have required I&D in the past.  Patient really prefers not to miss work but recently having more severe flares--shows me pictures of large abscesses on her phone.  On doxycycline daily.  Using Hibiclens daily.  Has seen dermatology in the past for this as well.  Thinks her recent worsening is related to stress and weight gain.  PERTINENT  PMH / PSH: depression, obesity, h/o pre-eclampsia, h/o gestational diabetes  OBJECTIVE:   BP (!) 142/86   Pulse 87   Wt 286 lb 3.2 oz (129.8 kg)   LMP 04/23/2022 (Exact Date)   SpO2 99%   Breastfeeding Unknown   BMI 54.08 kg/m   Gen: NAD, pleasant, able to participate in exam CV: RRR, normal S1/S2, no murmur Resp: Normal effort, lungs CTAB Extremities: no edema or cyanosis Skin: warm and dry, no rashes or lesions noted Neuro: alert, no obvious focal deficits Psych: Normal affect  ASSESSMENT/PLAN:   Routine Screening for STI -HIV and RPR obtained today -Offered vaginal swab for GC/chlamydia/trich but patient declines (recently completed at [facility]).  Encounter for contraceptive management Upreg negative today. Counseled on all available options including efficacy, duration, side effects. Patient also given resources in AVS to read more. Interested in [place] again. Will schedule separate appointment for placement.  Elevated blood pressure  reading BP elevated today x2. No prior diagnosis of HTN although she does have history of pre-eclampsia. Return in 2 weeks for BP follow-up.   Hydradenitis Not flaring currently but has had severe flares in the recent past. On doxycycline daily and using hibiclens without good control. Her recent weight gain may be contributing. Patient to bring FMLA paperwork to the office and I can complete.   Major depression, recurrent (HCC) PHQ-9 elevated today and patient endorsing depression. No SI. She will schedule separate appointment to discuss.     Maury Dus, MD [facility]

## 2022-05-16 NOTE — Patient Instructions (Addendum)
It was great to meet you!  Today we did blood work for HIV and syphilis. I will send you a MyChart message with the results.  The website to compare different birth control methods is called bedsider.org  You are overdue for a Pap (screening for cervical cancer).  Your last Pap smear was abnormal, so it is very important you have this done.   If you get FMLA paperwork from your employer and bring it to our office we can fill out-- you MAY be asked to make an appointment for this.  Schedule multiple appointments on your way out today: 1. To discuss depression 2. For blood pressure follow-up. Yours was elevated today 3. For Nexplanon insertion 4. For a pap (either here or with your OBGYN office).   Take care, Dr Anner Crete

## 2022-05-17 LAB — HIV ANTIBODY (ROUTINE TESTING W REFLEX): HIV Screen 4th Generation wRfx: NONREACTIVE

## 2022-05-17 LAB — RPR: RPR Ser Ql: NONREACTIVE

## 2022-05-18 DIAGNOSIS — R030 Elevated blood-pressure reading, without diagnosis of hypertension: Secondary | ICD-10-CM | POA: Insufficient documentation

## 2022-05-18 DIAGNOSIS — Z309 Encounter for contraceptive management, unspecified: Secondary | ICD-10-CM | POA: Insufficient documentation

## 2022-05-18 NOTE — Assessment & Plan Note (Addendum)
Upreg negative today. Counseled on all available options including efficacy, duration, side effects. Patient also given resources in AVS to read more. Interested in [place] again. Will schedule separate appointment for placement.

## 2022-05-18 NOTE — Assessment & Plan Note (Signed)
Not flaring currently but has had severe flares in the recent past. On doxycycline daily and using hibiclens without good control. Her recent weight gain may be contributing. Patient to bring FMLA paperwork to the office and I can complete.

## 2022-05-18 NOTE — Assessment & Plan Note (Signed)
BP elevated today x2. No prior diagnosis of HTN although she does have history of pre-eclampsia. Return in 2 weeks for BP follow-up.

## 2022-05-18 NOTE — Assessment & Plan Note (Signed)
PHQ-9 elevated today and patient endorsing depression. No SI. She will schedule separate appointment to discuss.

## 2022-07-23 ENCOUNTER — Other Ambulatory Visit: Payer: Self-pay

## 2022-07-23 ENCOUNTER — Emergency Department (HOSPITAL_COMMUNITY)
Admission: EM | Admit: 2022-07-23 | Discharge: 2022-07-24 | Discharge status: Against Medical Advice | Payer: Medicaid Other | Attending: Emergency Medicine | Admitting: Emergency Medicine

## 2022-07-23 ENCOUNTER — Emergency Department (HOSPITAL_COMMUNITY): Payer: Medicaid Other

## 2022-07-23 DIAGNOSIS — M25512 Pain in left shoulder: Secondary | ICD-10-CM | POA: Diagnosis not present

## 2022-07-23 DIAGNOSIS — Z5321 Procedure and treatment not carried out due to patient leaving prior to being seen by health care provider: Secondary | ICD-10-CM | POA: Diagnosis not present

## 2022-07-23 DIAGNOSIS — S42002A Fracture of unspecified part of left clavicle, initial encounter for closed fracture: Secondary | ICD-10-CM | POA: Diagnosis not present

## 2022-07-23 DIAGNOSIS — G8929 Other chronic pain: Secondary | ICD-10-CM | POA: Insufficient documentation

## 2022-07-23 LAB — CBC WITH DIFFERENTIAL/PLATELET
Abs Immature Granulocytes: 0.03 10*3/uL (ref 0.00–0.07)
Basophils Absolute: 0 10*3/uL (ref 0.0–0.1)
Basophils Relative: 0 %
Eosinophils Absolute: 0.1 10*3/uL (ref 0.0–0.5)
Eosinophils Relative: 1 %
HCT: 37.2 % (ref 36.0–46.0)
Hemoglobin: 11.4 g/dL — ABNORMAL LOW (ref 12.0–15.0)
Immature Granulocytes: 0 %
Lymphocytes Relative: 33 %
Lymphs Abs: 3.1 10*3/uL (ref 0.7–4.0)
MCH: 25.7 pg — ABNORMAL LOW (ref 26.0–34.0)
MCHC: 30.6 g/dL (ref 30.0–36.0)
MCV: 84 fL (ref 80.0–100.0)
Monocytes Absolute: 0.5 10*3/uL (ref 0.1–1.0)
Monocytes Relative: 6 %
Neutro Abs: 5.5 10*3/uL (ref 1.7–7.7)
Neutrophils Relative %: 60 %
Platelets: 354 10*3/uL (ref 150–400)
RBC: 4.43 MIL/uL (ref 3.87–5.11)
RDW: 18.4 % — ABNORMAL HIGH (ref 11.5–15.5)
WBC: 9.3 10*3/uL (ref 4.0–10.5)
nRBC: 0 % (ref 0.0–0.2)

## 2022-07-23 LAB — BASIC METABOLIC PANEL
Anion gap: 9 (ref 5–15)
BUN: 9 mg/dL (ref 6–20)
CO2: 24 mmol/L (ref 22–32)
Calcium: 8.8 mg/dL — ABNORMAL LOW (ref 8.9–10.3)
Chloride: 105 mmol/L (ref 98–111)
Creatinine, Ser: 0.7 mg/dL (ref 0.44–1.00)
GFR, Estimated: >60 mL/min (ref >60)
Glucose, Bld: 122 mg/dL — ABNORMAL HIGH (ref 70–99)
Potassium: 3.5 mmol/L (ref 3.5–5.1)
Sodium: 138 mmol/L (ref 135–145)

## 2022-07-23 LAB — HCG, SERUM, QUALITATIVE: Preg, Serum: NEGATIVE

## 2022-07-23 NOTE — ED Triage Notes (Signed)
Patient reports chronic left shoulder pain for 2 months , denies injury or fall , pain increases with movement .

## 2022-07-24 ENCOUNTER — Ambulatory Visit (HOSPITAL_COMMUNITY): Payer: Self-pay

## 2022-08-12 IMAGING — DX DG KNEE COMPLETE 4+V*R*
4 series · 4 of 4 positions shown · non-contrast
Comparison: None.

CLINICAL DATA: Fell yesterday with anterior pain.

EXAM:
RIGHT KNEE - COMPLETE 4+ VIEW

[knee ap]
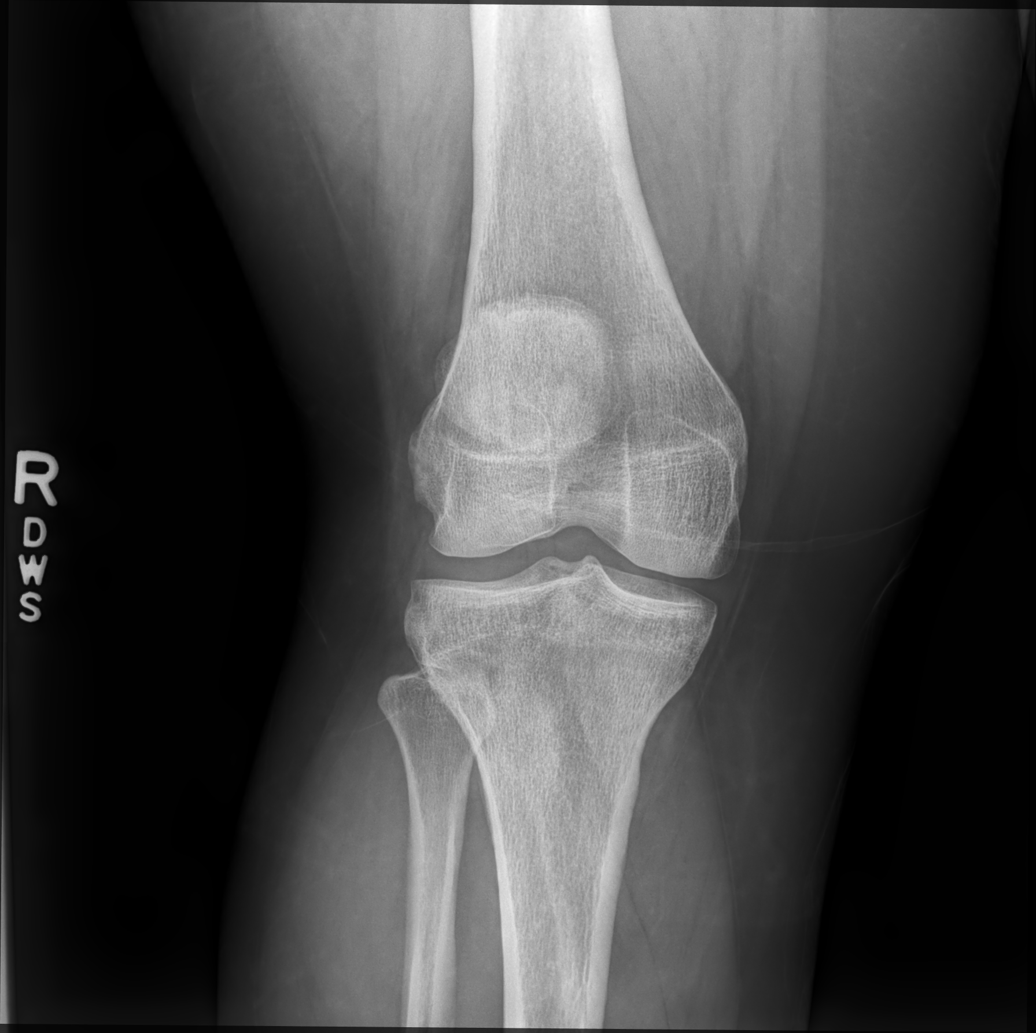

[knee lmo]
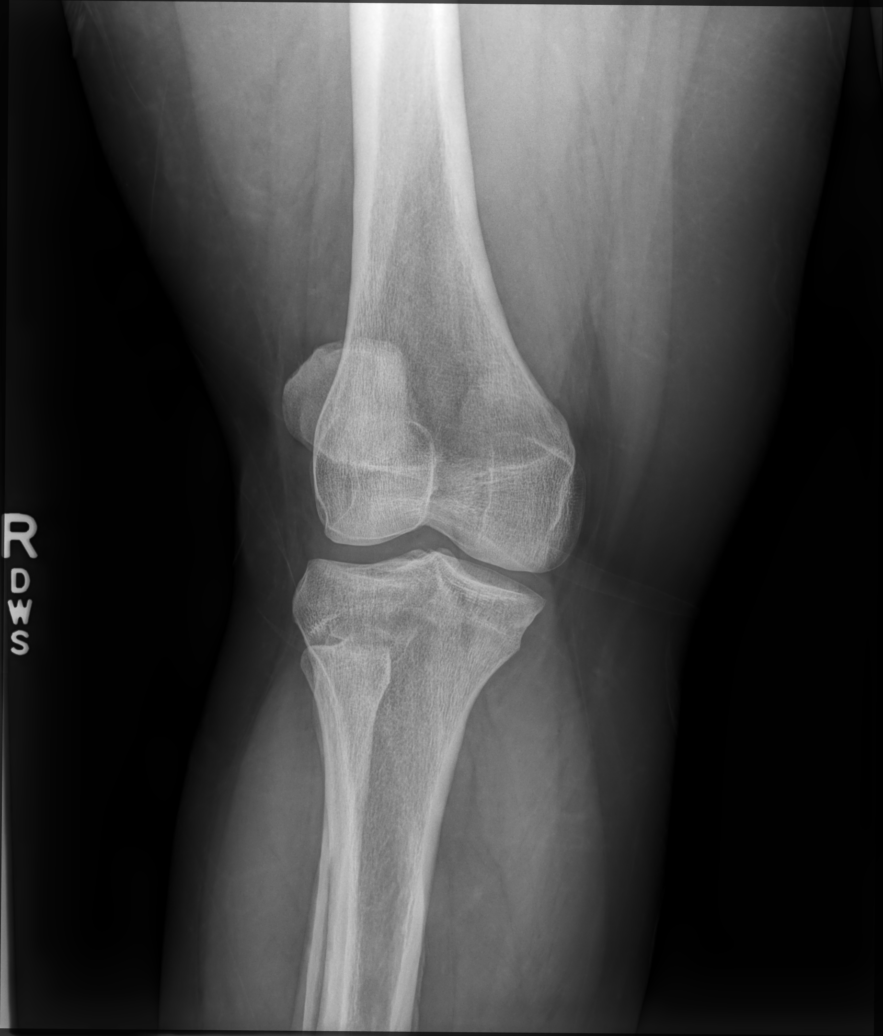

[knee mlo]
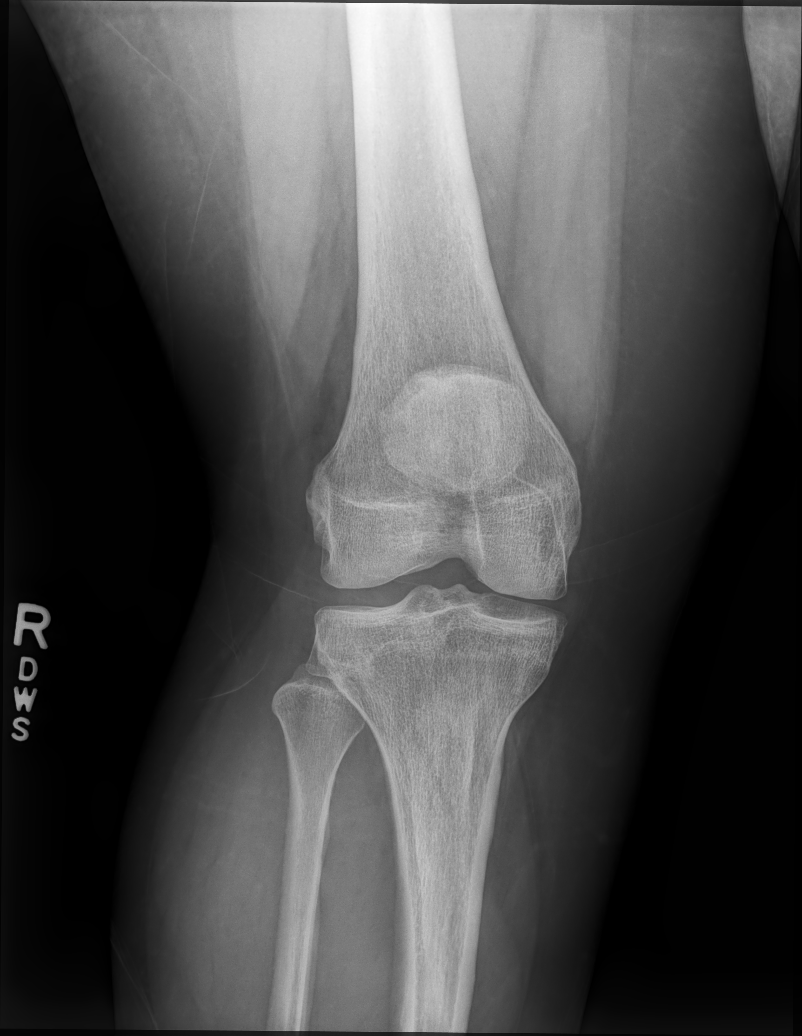

[knee lat]
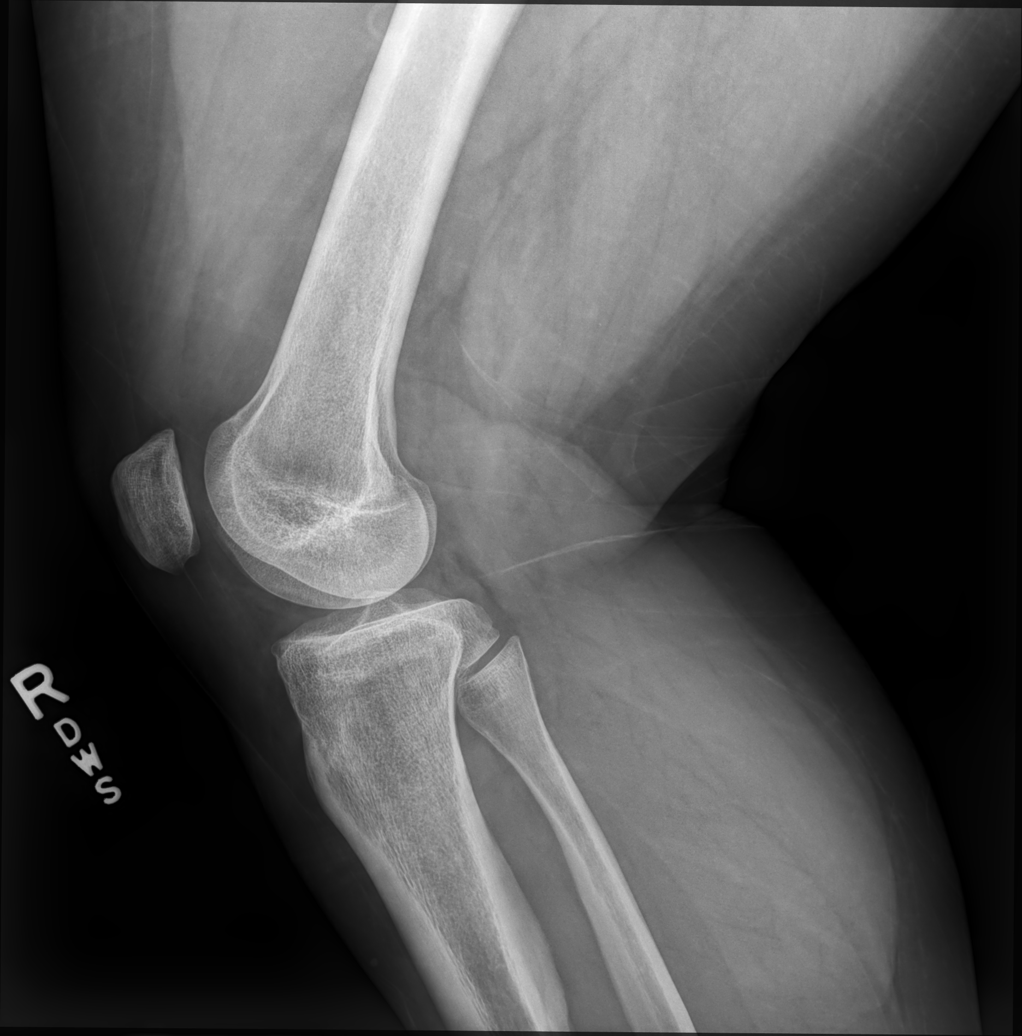

[4 of 4 positions shown; findings below may reference images not displayed]

FINDINGS: Anterior soft tissue swelling. No visible joint effusion. No
evidence of regional fracture. No degenerative changes. No focal
findings.
IMPRESSION: Anterior soft tissue swelling. No other finding.

## 2022-09-26 ENCOUNTER — Ambulatory Visit: Payer: Medicaid Other

## 2022-10-03 ENCOUNTER — Ambulatory Visit (INDEPENDENT_AMBULATORY_CARE_PROVIDER_SITE_OTHER): Payer: Medicaid Other | Admitting: Family Medicine

## 2022-10-03 ENCOUNTER — Encounter: Payer: Self-pay | Admitting: Family Medicine

## 2022-10-03 ENCOUNTER — Other Ambulatory Visit: Payer: Self-pay

## 2022-10-03 VITALS — BP 122/70 | HR 90 | Ht 60.0 in | Wt 286.8 lb

## 2022-10-03 DIAGNOSIS — L732 Hidradenitis suppurativa: Secondary | ICD-10-CM

## 2022-10-03 DIAGNOSIS — Z3A01 Less than 8 weeks gestation of pregnancy: Secondary | ICD-10-CM | POA: Diagnosis not present

## 2022-10-03 DIAGNOSIS — Z3200 Encounter for pregnancy test, result unknown: Secondary | ICD-10-CM

## 2022-10-03 DIAGNOSIS — O0991 Supervision of high risk pregnancy, unspecified, first trimester: Secondary | ICD-10-CM

## 2022-10-03 DIAGNOSIS — Z3491 Encounter for supervision of normal pregnancy, unspecified, first trimester: Secondary | ICD-10-CM | POA: Diagnosis not present

## 2022-10-03 LAB — POCT URINE PREGNANCY: Preg Test, Ur: POSITIVE — AB

## 2022-10-03 MED ORDER — CLINDAMYCIN PHOSPHATE 1 % EX SOLN
Freq: Two times a day (BID) | CUTANEOUS | 0 refills | Status: DC
Start: 2022-10-03 — End: 2022-10-20

## 2022-10-03 NOTE — Progress Notes (Signed)
    SUBJECTIVE:   CHIEF COMPLAINT / HPI:   Confirm pregnancy Has been feeling nauseous in the morning. Took a test on 9/3 and was faintly positive. Was supposed to start period on 9/6 but it did not come. She has taken 9 other pregnancy tests at home.  HS flare Has flare up on her side and back. Usually has flares here. Takes doxy daily for this. Has never tried metformin. Has seen dermatologist who also gave her a steroid cream.  PERTINENT  PMH / PSH: GDM, tobacco use, major depression, LG SIL, preeclampsia, GAD, alpha Thal trait  OBJECTIVE:   BP 122/70   Pulse 90   Ht 5' (1.524 m)   Wt 286 lb 12.8 oz (130.1 kg)   LMP 08/22/2022   SpO2 98%   BMI 56.01 kg/m   General: Alert and oriented, in NAD Skin: Warm, dry, and intact without lesions HEENT: NCAT, EOM grossly normal, midline nasal septum Cardiac: RRR, no m/r/g appreciated Respiratory: CTAB, breathing and speaking comfortably on RA Abdominal: Soft, nontender, nondistended, normoactive bowel sounds Extremities: Moves all extremities grossly equally Neurological: No gross focal deficit Psychiatric: Somewhat depressed mood  ASSESSMENT/PLAN:   Supervision of high risk pregnancy in first trimester Pregnancy test positive.  Multiple other test at home positive.  Collected initial labs today.  She is certain of her last period so no dating ultrasound was ordered.  Patient meets high risk criteria; therefore, [facility].  Will keep appointment with Dr. Barb Merino given patient may not be able to follow with OB as quickly.  Hidradenitis suppurativa Current flare.  Was on doxycycline daily.  Given pregnancy, advised to stop.  She does have triamcinolone ointment at home given to her by dermatologist.  She can continue this for now.  Will also send in topical clindamycin to be applied twice daily until resolution.  Follow-up improvement.   Health maintenance Patient will need  updated Pap smear.  Can obtain at new OB appointment with GC/chlamydia.  Janeal Holmes, MD [facility]

## 2022-10-03 NOTE — Assessment & Plan Note (Signed)
Current flare.  Was on doxycycline daily.  Given pregnancy, advised to stop.  She does have triamcinolone ointment at home given to her by dermatologist.  She can continue this for now.  Will also send in topical clindamycin to be applied twice daily until resolution.  Follow-up improvement.

## 2022-10-03 NOTE — Assessment & Plan Note (Addendum)
Pregnancy test positive.  Multiple other test at home positive.  Collected initial labs today.  She is certain of her last period so no dating ultrasound was ordered.  Patient meets high risk criteria; therefore, [facility].  Will keep appointment with Dr. Barb Merino given patient may not be able to follow with OB as quickly.

## 2022-10-03 NOTE — Patient Instructions (Addendum)
I have ordered labs for you. I will update you on their results. I have sent in clindamycin twice a day to the affected areas. Please stop doxycycline. We can revisit metformin later. Keep me updated on your decisions going forward. I can help give you resources if needed!  Dr. Phineas Real

## 2022-10-05 ENCOUNTER — Inpatient Hospital Stay (HOSPITAL_COMMUNITY)
Admission: AD | Admit: 2022-10-05 | Discharge: 2022-10-05 | Disposition: A | Discharge status: Home / Self Care | Payer: Medicaid Other | Attending: Obstetrics and Gynecology | Admitting: Obstetrics and Gynecology

## 2022-10-05 ENCOUNTER — Inpatient Hospital Stay (HOSPITAL_COMMUNITY): Payer: Medicaid Other

## 2022-10-05 ENCOUNTER — Encounter (HOSPITAL_COMMUNITY): Payer: Self-pay | Admitting: *Deleted

## 2022-10-05 DIAGNOSIS — I252 Old myocardial infarction: Secondary | ICD-10-CM | POA: Diagnosis present

## 2022-10-05 DIAGNOSIS — N858 Other specified noninflammatory disorders of uterus: Secondary | ICD-10-CM | POA: Diagnosis not present

## 2022-10-05 DIAGNOSIS — O34211 Maternal care for low transverse scar from previous cesarean delivery: Secondary | ICD-10-CM | POA: Diagnosis not present

## 2022-10-05 DIAGNOSIS — Z3A01 Less than 8 weeks gestation of pregnancy: Secondary | ICD-10-CM | POA: Diagnosis not present

## 2022-10-05 DIAGNOSIS — O418X10 Other specified disorders of amniotic fluid and membranes, first trimester, not applicable or unspecified: Secondary | ICD-10-CM

## 2022-10-05 DIAGNOSIS — D251 Intramural leiomyoma of uterus: Secondary | ICD-10-CM | POA: Insufficient documentation

## 2022-10-05 DIAGNOSIS — O208 Other hemorrhage in early pregnancy: Secondary | ICD-10-CM | POA: Insufficient documentation

## 2022-10-05 DIAGNOSIS — O3410 Maternal care for benign tumor of corpus uteri, unspecified trimester: Secondary | ICD-10-CM

## 2022-10-05 DIAGNOSIS — O209 Hemorrhage in early pregnancy, unspecified: Secondary | ICD-10-CM

## 2022-10-05 DIAGNOSIS — O3411 Maternal care for benign tumor of corpus uteri, first trimester: Secondary | ICD-10-CM | POA: Insufficient documentation

## 2022-10-05 DIAGNOSIS — O3680X0 Pregnancy with inconclusive fetal viability, not applicable or unspecified: Secondary | ICD-10-CM | POA: Insufficient documentation

## 2022-10-05 DIAGNOSIS — O3481 Maternal care for other abnormalities of pelvic organs, first trimester: Secondary | ICD-10-CM | POA: Diagnosis not present

## 2022-10-05 DIAGNOSIS — R109 Unspecified abdominal pain: Secondary | ICD-10-CM

## 2022-10-05 DIAGNOSIS — O26851 Spotting complicating pregnancy, first trimester: Secondary | ICD-10-CM | POA: Diagnosis present

## 2022-10-05 LAB — CBC/D/PLT+RPR+RH+ABO+RUBIGG...
Antibody Screen: NEGATIVE
Basophils Absolute: 0 10*3/uL (ref 0.0–0.2)
Basos: 0 %
Bilirubin, UA: NEGATIVE
EOS (ABSOLUTE): 0.1 10*3/uL (ref 0.0–0.4)
Eos: 1 %
Glucose, UA: NEGATIVE
HCV Ab: NONREACTIVE
HIV Screen 4th Generation wRfx: NONREACTIVE
Hematocrit: 33.4 % — ABNORMAL LOW (ref 34.0–46.6)
Hemoglobin: 10.5 g/dL — ABNORMAL LOW (ref 11.1–15.9)
Hepatitis B Surface Ag: NEGATIVE
Immature Grans (Abs): 0 10*3/uL (ref 0.0–0.1)
Immature Granulocytes: 0 %
Ketones, UA: NEGATIVE
Leukocytes,UA: NEGATIVE
Lymphocytes Absolute: 1.6 10*3/uL (ref 0.7–3.1)
Lymphs: 23 %
MCH: 26.2 pg — ABNORMAL LOW (ref 26.6–33.0)
MCHC: 31.4 g/dL — ABNORMAL LOW (ref 31.5–35.7)
MCV: 83 fL (ref 79–97)
Monocytes Absolute: 0.4 10*3/uL (ref 0.1–0.9)
Monocytes: 5 %
Neutrophils Absolute: 4.8 10*3/uL (ref 1.4–7.0)
Neutrophils: 71 %
Nitrite, UA: NEGATIVE
Platelets: 301 10*3/uL (ref 150–450)
Protein,UA: NEGATIVE
RBC, UA: NEGATIVE
RBC: 4.01 x10E6/uL (ref 3.77–5.28)
RDW: 16 % — ABNORMAL HIGH (ref 11.7–15.4)
RPR Ser Ql: NONREACTIVE
Rh Factor: POSITIVE
Rubella Antibodies, IGG: 1.45 index (ref >0.99)
Specific Gravity, UA: 1.024 (ref 1.005–1.030)
Urobilinogen, Ur: 0.2 mg/dL (ref 0.2–1.0)
WBC: 6.8 10*3/uL (ref 3.4–10.8)
pH, UA: 6 (ref 5.0–7.5)

## 2022-10-05 LAB — HCG, QUANTITATIVE, PREGNANCY: hCG, Beta Chain, Quant, S: 56691 m[IU]/mL — ABNORMAL HIGH (ref <5)

## 2022-10-05 LAB — MICROSCOPIC EXAMINATION
Bacteria, UA: NONE SEEN
Casts: NONE SEEN /LPF
RBC, Urine: NONE SEEN /HPF (ref 0–2)

## 2022-10-05 LAB — WET PREP, GENITAL
Clue Cells Wet Prep HPF POC: NONE SEEN
Sperm: NONE SEEN
Trich, Wet Prep: NONE SEEN
WBC, Wet Prep HPF POC: <10 (ref <10)
Yeast Wet Prep HPF POC: NONE SEEN

## 2022-10-05 LAB — CBC
HCT: 35.2 % — ABNORMAL LOW (ref 36.0–46.0)
Hemoglobin: 11.2 g/dL — ABNORMAL LOW (ref 12.0–15.0)
MCH: 26.1 pg (ref 26.0–34.0)
MCHC: 31.8 g/dL (ref 30.0–36.0)
MCV: 82.1 fL (ref 80.0–100.0)
Platelets: 338 10*3/uL (ref 150–400)
RBC: 4.29 MIL/uL (ref 3.87–5.11)
RDW: 17.1 % — ABNORMAL HIGH (ref 11.5–15.5)
WBC: 8 10*3/uL (ref 4.0–10.5)
nRBC: 0 % (ref 0.0–0.2)

## 2022-10-05 LAB — HCV INTERPRETATION

## 2022-10-05 LAB — HIV ANTIBODY (ROUTINE TESTING W REFLEX): HIV Screen 4th Generation wRfx: NONREACTIVE

## 2022-10-05 LAB — URINE CULTURE, OB REFLEX

## 2022-10-05 MED ORDER — FERROUS SULFATE 325 (65 FE) MG PO TABS
325.0000 mg | ORAL_TABLET | ORAL | 3 refills | Status: DC
Start: 2022-10-05 — End: 2022-11-05

## 2022-10-05 NOTE — Progress Notes (Addendum)
Sent MyChart message to patient stating to take iron supplementation every other day given anemia. Also advised to not use NSAIDs like ibuprofen/naproxen. Take a daily prenatal vitamin. She will be following with Dr. Barb Merino for [facility] care until able to get in with OB/GYN given high risk nature of pregnancy.

## 2022-10-05 NOTE — Discharge Instructions (Signed)
Return to MAU: If you have heavier bleeding that soaks through more that 2 pads per hour for an hour or more If you bleed so much that you feel like you might pass out or you do pass out If you have significant abdominal pain that is not improved with Tylenol 1000 mg every 8 hours as needed for pain If you develop a fever > 100.5  

## 2022-10-05 NOTE — MAU Note (Addendum)
Denise Gillespie is a 32 y.o. at ~[redacted]w[redacted]d, here in MAU reporting: +HPT 9/3, yesterday went out with kids shopping, was walking for about 2+hrs.  Felt like she had peed on herself.  When she got home, there was blood in her underwear.  Has been brownish, scant to small amt.  Little cramping in lower stomach. Confirmed preg at dr this past wk. LMP: early August Onset of complaint: yesterday Pain score: mild Vitals:   10/05/22 1606  BP: 126/79  Pulse: 85  Resp: 18  Temp: 99.1 F (37.3 C)  SpO2: 99%      Lab orders placed from triage:     ABORH found in chart   A+

## 2022-10-05 NOTE — Addendum Note (Signed)
Addended by: Evette Georges B on: 10/05/2022 08:07 AM   Modules accepted: Orders

## 2022-10-05 NOTE — MAU Provider Note (Signed)
History     CSN: [id number]  Arrival date and time: 10/05/22 1534   Event Date/Time   First Provider Initiated Contact with Patient 10/05/22 1716      Chief Complaint  Patient presents with   Vaginal Bleeding   Abdominal Pain   HPI Ms. LURENE MULNIX is a 32 y.o. year old G64P2103 female at [redacted]w[redacted]d weeks gestation who presents to MAU reporting (+) UPT at [facility]. She did not have an U/S there to verify viability. She had prenatal labs drawn and is scheduled to return on 10/14/2022 for her NOB visit with a provider. She reports that she walked around in stores for more than 2 hours yesterday with her children. She thought she peed on herself while walking to the car, but realized it was blood when she got home in the BR. She describes the bleeding as scant in amount and brownish in color. She also reports some mild, lower abdominal cramping.   OB History     Gravida  5   Para  3   Term  2   Preterm  1   AB      Living  3      SAB      IAB      Ectopic      Multiple  0   Live Births  3           Past Medical History:  Diagnosis Date   Abscess    multiple   Anemia, iron deficiency    Depression    Gestational diabetes    Hidradenitis    Obesity    Pregnancy induced hypertension     Past Surgical History:  Procedure Laterality Date   CESAREAN SECTION     INCISE AND DRAIN ABCESS      Family History  Problem Relation Age of Onset   Hypertension Mother    Migraines Mother    Eczema Brother     Social History   Tobacco Use   Smoking status: Former    Current packs/day: 0.00    Types: Cigarettes    Quit date: 09/18/2018    Years since quitting: 4.0   Smokeless tobacco: Never   Tobacco comments:    quit when found out pregnant   Vaping Use   Vaping status: Never Used  Substance Use Topics   Alcohol use: Not Currently    Comment: Social   Drug use: No    Allergies: No Known Allergies  Medications Prior to Admission   Medication Sig Dispense Refill Last Dose   chlorhexidine (HIBICLENS) 4 % external liquid Apply topically daily as needed. (Patient not taking: Reported on 10/03/2022)      clindamycin (CLEOCIN T) 1 % external solution Apply topically 2 (two) times daily. 60 mL 0    doxycycline (ADOXA) 100 MG tablet Take 100 mg by mouth daily.      ferrous sulfate 325 (65 FE) MG tablet Take 1 tablet (325 mg total) by mouth every other day. 30 tablet 3    naproxen (NAPROSYN) 500 MG tablet Take 1 tablet (500 mg total) by mouth 2 (two) times daily. (Patient not taking: Reported on 10/03/2022) 30 tablet 0     Review of Systems  Constitutional: Negative.   HENT: Negative.    Eyes: Negative.   Respiratory: Negative.    Cardiovascular: Negative.   Gastrointestinal: Negative.   Endocrine: Negative.   Genitourinary:  Positive for pelvic pain (cramping) and vaginal bleeding (  scant amount of brownish blood after 2hrs of walking in the store yesterday).  Musculoskeletal: Negative.   Skin: Negative.   Allergic/Immunologic: Negative.   Neurological: Negative.   Hematological: Negative.   Psychiatric/Behavioral: Negative.     Physical Exam   Blood pressure 126/79, pulse 85, temperature 99.1 F (37.3 C), temperature source Oral, resp. rate 18, height 5' (1.524 m), weight 129.3 kg, last menstrual period 08/22/2022, SpO2 99%, unknown if currently breastfeeding.  Physical Exam Vitals and nursing note reviewed.  Constitutional:      Appearance: Normal appearance. She is obese.  Cardiovascular:     Rate and Rhythm: Normal rate.  Pulmonary:     Effort: Pulmonary effort is normal.  Genitourinary:    Comments: Swabs collected by patient using blind swab technique  Musculoskeletal:        General: Normal range of motion.  Skin:    General: Skin is warm and dry.  Neurological:     Mental Status: She is alert and oriented to person, place, and time.  Psychiatric:        Mood and Affect: Mood normal.         Behavior: Behavior normal.        Thought Content: Thought content normal.        Judgment: Judgment normal.    MAU Course  Procedures  MDM CCUA UPT CBC ABO/Rh HCG OB U/S < 14 wks TVUS  Results for orders placed or performed during the hospital encounter of 10/05/22 (from the past 24 hour(s))  CBC     Status: Abnormal   Collection Time: 10/05/22  5:34 PM  Result Value Ref Range   WBC 8.0 4.0 - 10.5 K/uL   RBC 4.29 3.87 - 5.11 MIL/uL   Hemoglobin 11.2 (L) 12.0 - 15.0 g/dL   HCT 16.1 (L) 09.6 - 04.5 %   MCV 82.1 80.0 - 100.0 fL   MCH 26.1 26.0 - 34.0 pg   MCHC 31.8 30.0 - 36.0 g/dL   RDW 40.9 (H) 81.1 - 91.4 %   Platelets 338 150 - 400 K/uL   nRBC 0.0 0.0 - 0.2 %  hCG, quantitative, pregnancy     Status: Abnormal   Collection Time: 10/05/22  5:34 PM  Result Value Ref Range   hCG, Beta Chain, Quant, S 56,691 (H) <5 mIU/mL  Wet prep, genital     Status: None   Collection Time: 10/05/22  5:40 PM   Specimen: PATH Cytology Cervicovaginal Ancillary Only  Result Value Ref Range   Yeast Wet Prep HPF POC NONE SEEN NONE SEEN   Trich, Wet Prep NONE SEEN NONE SEEN   Clue Cells Wet Prep HPF POC NONE SEEN NONE SEEN   WBC, Wet Prep HPF POC <10 <10   Sperm NONE SEEN     US OB LESS THAN 14 WEEKS WITH OB TRANSVAGINAL  Result Date: 10/05/2022 CLINICAL DATA:  Pregnant, assigned gestational age [redacted] weeks, 2 days. Vaginal bleeding. EXAM: OBSTETRIC <14 WK [place] AND TRANSVAGINAL OB US TECHNIQUE: Both transabdominal and transvaginal ultrasound examinations were performed for complete evaluation of the gestation as well as the maternal uterus, adnexal regions, and pelvic cul-de-sac. Transvaginal technique was performed to assess early pregnancy. COMPARISON:  None Available. FINDINGS: Intrauterine gestational sac: Present, single Yolk sac:  Present, single Embryo:  Not visualized Cardiac Activity: Not applicable MSD: 16 mm   6 w   3 d CRL: Not applicable Subchorionic hemorrhage: A large subchorionic  hemorrhage is identified measuring up to 3.8  x 1.6 x 3.7 cm. Maternal uterus/adnexae: The uterus is anteverted. The cervix is closed and is unremarkable. C-section scar noted within the lower uterine segment anteriorly. The myometrial wall is heterogeneous and at least 1 isoechoic solid rounded intramural mass is identified compatible with an intramural uterine fibroid measuring up to 2.6 cm in dimension. Trace simple appearing free fluid is seen within the pelvis. The maternal right ovary is unremarkable. The maternal left ovary is not visualized. IMPRESSION: Single intrauterine gestational sac. Visualization of the yolk sac roughly estimates the gestational between 5.5 and 6.0 weeks. Follow-up sonography is recommended in 10-14 days to document appropriate progression and better age the gestation. Large subchorionic hemorrhage. Electronically Signed   By: Helyn Numbers M.D.   On: 10/05/2022 18:31      Assessment and Plan  1. Pregnancy with uncertain fetal viability, single or unspecified fetus - Repeat U/S in 2 weeks >> scheduled @ [facility] on 10/20/22 @ 1115  2. Vaginal bleeding in pregnancy, first trimester - Information provided on vaginal bleeding in pregnancy   3. Abdominal cramping affecting pregnancy - Information provided on abdominal pain in pregnancy  4. Subchorionic hematoma in first trimester, single or unspecified fetus - Information provided on [facility] - Return to MAU: If you have heavier bleeding that soaks through more that 2 pads per hour for an hour or more If you bleed so much that you feel like you might pass out or you do pass out If you have significant abdominal pain that is not improved with Tylenol 1000 mg every 8 hours as needed for pain If you develop a fever > 100.5    5. Uterine fibroid in pregnancy - Information provided on fibroids    6. [redacted] weeks gestation of pregnancy   - Discharge patient - Keep scheduled appt with [facility] on 10/14/2022 - Patient verbalized an  understanding of the plan of care and agrees.    Raelyn Mora, CNM 10/05/2022, 5:16 PM

## 2022-10-06 LAB — GC/CHLAMYDIA PROBE AMP (~~LOC~~) NOT AT ARMC
Chlamydia: NEGATIVE
Comment: NEGATIVE
Comment: NORMAL
Neisseria Gonorrhea: NEGATIVE

## 2022-10-14 ENCOUNTER — Encounter: Payer: Medicaid Other | Admitting: Family Medicine

## 2022-10-14 NOTE — Progress Notes (Deleted)
Patient Name: Denise Gillespie Date of Birth: 1990-10-03 [facility] Initial Prenatal Visit  Denise Gillespie is a 32 y.o. year old G94P2103 at [redacted]w[redacted]d who presents for her initial prenatal visit. Pregnancy {Is/is not:9024} planned She reports {pregnancy symptoms:18128}. She {is/is not:320031::"is"} taking a prenatal vitamin.  She denies pelvic pain or vaginal bleeding.   She presented to the MAU on 9/22 due to vaginal bleeding and was found to have large subchorionic hemorrhage on ultrasound, scheduled for follow-up ultrasound on 10/7***  Pregnancy Dating: The patient is dated by ***.  LMP: *** Period is certain:  {yes/no:20286}.  Periods were regular:  {yes/no:20286}.  LMP was a typical period:  {yes/no:20286}.  Using hormonal contraception in 3 months prior to conception: {yes/no:20286}  Lab Review: Blood type: A Rh Status: + Antibody screen: Negative HIV: Negative RPR: Negative Hemoglobin electrophoresis reviewed: {yes/no:20286::"Yes"} Results of OB urine culture are: Negative Rubella: Immune Hep C Ab: Negative Varicella status is {Desc; immune/not/unknown:31571::"Immune"}  PMH: Reviewed and as detailed below: HTN: {yes/no:20286::"No"}  Gestational Hypertension/preeclampsia: {yes/no:20286::"No"}  Type 1 or 2 Diabetes: {yes/no:20286::"No"}  Depression:  {yes/no:20286::"No"}  Seizure disorder:  {yes/no:20286::"No"} VTE: {yes/no:20286::"No"} ,  History of STI {yes/no:20286::"No"},  Abnormal Pap smear:  {yes/no:20286::"No"}, Genital herpes simplex:  {yes/no:20286::"No"}   PSH: Gynecologic Surgery:  {No/  **:31982:o:"no"} Surgical history reviewed, notable for: ***  Obstetric History: Obstetric history tab updated and reviewed.  Summary of prior pregnancies: *** Cesarean delivery: {yes/no:20286::"No"}  Gestational Diabetes:  {yes/no:20286::"No"} Hypertension in pregnancy: {yes/no:20286::"No"} History of preterm birth: {yes/no:20286::"No"} History  of LGA/SGA infant:  {yes/no:20286::"No"} History of shoulder dystocia: {yes/no:20286::"No"} Indications for referral were reviewed, and the patient has no obstetric indications for referral to High Risk OB Clinic at this time.   Social History: Partner's name: ***  Tobacco use: {yes/no:20286::"No"} Alcohol use:  {yes/no:20286::"No"} Other substance use:  {yes/no:20286::"No"}  Current Medications:  ***  Reviewed and appropriate in pregnancy.   Genetic and Infection Screen: Flow Sheet Updated {yes/no:20286::"Yes"}  Prenatal Exam: Gen: Well nourished, well developed.  No distress.  Vitals noted. HEENT: Normocephalic, atraumatic.  Neck supple without cervical lymphadenopathy, thyromegaly or thyroid nodules.  Fair dentition. CV: RRR no murmur, gallops or rubs Lungs: CTA B.  Normal respiratory effort without wheezes or rales. Abd: soft, NTND. +BS.  Uterus not appreciated above pelvis. GU: Normal external female genitalia without lesions.  Nl vaginal, well rugated without lesions. No vaginal discharge.  Bimanual exam: No adnexal mass or TTP. No CMT.  Uterus size *** Ext: No clubbing, cyanosis or edema. Psych: Normal grooming and dress.  Not depressed or anxious appearing.  Normal thought content and process without flight of ideas or looseness of associations  Fetal heart tones: {appropriate:23337::"Appropriate"}  Assessment/Plan:  Denise Gillespie is a 32 y.o. [phone] at [redacted]w[redacted]d who presents to initiate prenatal care. She is doing well.  Current pregnancy issues include ***.  Routine prenatal care: As dating {ACTION; IS/IS NOT:21021397::"is not"} reliable, a dating ultrasound {HAS HAS NOT:18834::"has"} been ordered. Dating tab updated. Pre-pregnancy weight updated. Expected weight gain this pregnancy is {weight gain pregnancy :23296::"25-35 pounds "} Prenatal labs reviewed, notable for ***. Indications for referral to [facility] were reviewed and the patient {DOES NOT does:27190::"does  not"} meet criteria for referral.  Medication list reviewed and updated.  Recommended patient see a dentist for regular care.  Bleeding and pain precautions reviewed. Importance of prenatal vitamins reviewed.  Genetic screening offered. Patient opted for: {obgeneticscreen:23414}. The patient has the following indications for aspirinto begin 81 mg at  12-16 weeks: One high risk condition: {fmcaspirinobhigh:26167} MORE than one moderate risk condition: {fmcaspirinobmoderate:26168} Aspirin {WAS/WAS [phone]::"was not"}  recommended today based upon above risk factors (one high risk condition or more than one moderate risk factor)  The patient {will/will not be:23415} age 89 or over at time of delivery. Referral to genetic counseling {WAS/WAS [phone]::"was not"} offered today.  The patient has the following risk factors for preexisting diabetes: {Pre-existing diabetes screening:23343::"Reviewed indications for early 1 hour glucose testing, not indicated "}. An early 1 hour glucose tolerance test {WAS/WAS [phone]::"was not"} ordered. Pregnancy Medical Home and PHQ-9 forms completed, problems noted: {yes/no:20286}  2. Pregnancy issues include the following which were addressed today:  Subchorionic hemorrhage noted MAU 9/22-patient scheduled for follow-up ultrasound 10/7   Follow up 4 weeks for next prenatal visit.

## 2022-10-20 ENCOUNTER — Encounter (HOSPITAL_COMMUNITY): Payer: Self-pay | Admitting: Family Medicine

## 2022-10-20 ENCOUNTER — Other Ambulatory Visit: Payer: Self-pay

## 2022-10-20 ENCOUNTER — Telehealth: Payer: Self-pay | Admitting: Family Medicine

## 2022-10-20 ENCOUNTER — Ambulatory Visit (INDEPENDENT_AMBULATORY_CARE_PROVIDER_SITE_OTHER): Payer: Medicaid Other

## 2022-10-20 ENCOUNTER — Telehealth: Payer: Self-pay

## 2022-10-20 ENCOUNTER — Inpatient Hospital Stay (HOSPITAL_COMMUNITY)
Admission: AD | Admit: 2022-10-20 | Discharge: 2022-10-20 | Disposition: A | Discharge status: Home / Self Care | Payer: Medicaid Other | Attending: Family Medicine | Admitting: Family Medicine

## 2022-10-20 DIAGNOSIS — O99211 Obesity complicating pregnancy, first trimester: Secondary | ICD-10-CM | POA: Diagnosis not present

## 2022-10-20 DIAGNOSIS — O283 Abnormal ultrasonic finding on antenatal screening of mother: Secondary | ICD-10-CM | POA: Insufficient documentation

## 2022-10-20 DIAGNOSIS — O0991 Supervision of high risk pregnancy, unspecified, first trimester: Secondary | ICD-10-CM

## 2022-10-20 DIAGNOSIS — O26891 Other specified pregnancy related conditions, first trimester: Secondary | ICD-10-CM | POA: Diagnosis not present

## 2022-10-20 DIAGNOSIS — O30001 Twin pregnancy, unspecified number of placenta and unspecified number of amniotic sacs, first trimester: Secondary | ICD-10-CM | POA: Diagnosis not present

## 2022-10-20 DIAGNOSIS — Z3A08 8 weeks gestation of pregnancy: Secondary | ICD-10-CM

## 2022-10-20 DIAGNOSIS — O209 Hemorrhage in early pregnancy, unspecified: Secondary | ICD-10-CM

## 2022-10-20 HISTORY — DX: Benign neoplasm of connective and other soft tissue, unspecified: D21.9

## 2022-10-20 MED ORDER — PRENATAL 28-0.8 MG PO TABS: 1.0000 | ORAL_TABLET | Freq: Every day | ORAL | 12 refills | Status: AC

## 2022-10-20 NOTE — MAU Note (Signed)
Denise Gillespie is a 32 y.o. at [redacted]w[redacted]d here in MAU reporting: went to [facility] today for [place].  Was told it was twins, but had a hard time seeing one.  Results say one is non-vaible.  No one was there to draw her blood - so they had her come here for blood work.  No longer bleeding (had been told previously she had a subchorionic hemorrhage.) no pain.  Onset of complaint: this was viability confirmation Pain score: none, does report 'occ random pain' Vitals:   10/20/22 1630  BP: (!) 142/87  Pulse: 90  Resp: 18  Temp: 98.8 F (37.1 C)  SpO2: 98%      Lab orders placed from triage:

## 2022-10-20 NOTE — MAU Provider Note (Signed)
History     [id number]  Arrival date and time: 10/20/22 1603    Chief Complaint  Patient presents with   Follow-up     HPI [name] is a 32 y.o. at [redacted]w[redacted]d by Korea earlier today with PMHx notable for preE, GDM, VBAC, obesity, who presents for abnormal [place] results.   Patient reports she was at [facility] earlier today and had an [place] Sonographer told her she had twin gestation but then only gave her a picture of one and told her to wait to be seen by a provider Then was told no provider was available at that moment and she would be called Per documentation she called nurse line, though patient denies this, and was directed to come to MAU Here she reports no significant pain, had some bleeding last week but none since She has seen an Korea report in MyChart and is wondering what it means  A/Positive/-- (09/20 1459)  OB History     Gravida  5   Para  3   Term  2   Preterm  1   AB  1   Living  3      SAB      IAB  1   Ectopic      Multiple  0   Live Births  3           Past Medical History:  Diagnosis Date   Abscess    multiple   Anemia, iron deficiency    Depression    Fibroid    Gestational diabetes    Hidradenitis    Obesity    Pregnancy induced hypertension     Past Surgical History:  Procedure Laterality Date   CESAREAN SECTION     INCISE AND DRAIN ABCESS     THERAPEUTIC ABORTION      Family History  Problem Relation Age of Onset   Hypertension Mother    Migraines Mother    Depression Mother    Liver disease Mother        fatty liver   Lumbar disc disease Mother        degenerative   Eczema Brother     Social History   Socioeconomic History   Marital status: Single    Spouse name: Not on file   Number of children: Not on file   Years of education: Not on file   Highest education level: Not on file  Occupational History   Not on file  Tobacco Use   Smoking status: Former    Current packs/day: 0.00    Types: Cigarettes     Quit date: 09/18/2018    Years since quitting: 4.0   Smokeless tobacco: Never   Tobacco comments:    quit when found out pregnant   Vaping Use   Vaping status: Former  Substance and Sexual Activity   Alcohol use: Not Currently    Alcohol/week: 5.0 standard drinks of alcohol    Types: 5 Glasses of wine per week    Comment: -   Drug use: No   Sexual activity: Yes    Birth control/protection: None  Other Topics Concern   Not on file  Social History Narrative   Not on file   Social Determinants of Health   Financial Resource Strain: Not on file  Food Insecurity: No Food Insecurity (04/21/2019)   Hunger Vital Sign    Worried About Running Out of Food in the Last Year: Never true    Ran  Out of Food in the Last Year: Never true  Transportation Needs: No Transportation Needs (04/21/2019)   PRAPARE - Administrator, Civil Service (Medical): No    Lack of Transportation (Non-Medical): No  Physical Activity: Not on file  Stress: Not on file  Social Connections: Unknown (05/27/2021)   Received from [facility], [facility]   Social Network    Social Network: Not on file  Intimate Partner Violence: Unknown (04/18/2021)   Received from [facility], [facility]   HITS    Physically Hurt: Not on file    Insult or Talk Down To: Not on file    Threaten Physical Harm: Not on file    Scream or Curse: Not on file    No Known Allergies  No current facility-administered medications on file prior to encounter.   Current Outpatient Medications on File Prior to Encounter  Medication Sig Dispense Refill   chlorhexidine (HIBICLENS) 4 % external liquid Apply topically daily as needed. (Patient not taking: Reported on 10/03/2022)     clindamycin (CLEOCIN T) 1 % external solution Apply topically 2 (two) times daily. 60 mL 0   ferrous sulfate 325 (65 FE) MG tablet Take 1 tablet (325 mg total) by mouth every other day. 30 tablet 3     ROS Pertinent positives and negative per HPI,  all others reviewed and negative  Physical Exam   BP (!) 142/87 (BP Location: Right Arm)   Pulse 90   Temp 98.8 F (37.1 C) (Oral)   Resp 18   Ht 5' (1.524 m)   Wt 130.7 kg   LMP 08/22/2022   SpO2 98%   BMI 56.29 kg/m   Patient Vitals for the past 24 hrs:  BP Temp Temp src Pulse Resp SpO2 Height Weight  10/20/22 1630 (!) 142/87 98.8 F (37.1 C) Oral 90 18 98 % 5' (1.524 m) 130.7 kg    Physical Exam Vitals reviewed.  Constitutional:      General: She is not in acute distress.    Appearance: She is well-developed. She is not diaphoretic.  Eyes:     General: No scleral icterus. Pulmonary:     Effort: Pulmonary effort is normal. No respiratory distress.  Skin:    General: Skin is warm and dry.  Neurological:     Mental Status: She is alert.     Coordination: Coordination normal.      Cervical Exam    Bedside Ultrasound Not performed.  My interpretation: n/a   Labs No results found for this or any previous visit (from the past 24 hour(s)).  Imaging US OB LESS THAN 14 WEEKS WITH OB TRANSVAGINAL  Result Date: 10/20/2022 ----------------------------------------------------------------------  OBSTETRICS REPORT                       (Signed Final 10/20/2022 01:24 pm) ---------------------------------------------------------------------- Patient Info  ID #:       [id number]                          D.O.B.:  [birth year] (32 yrs)  Name:       Denise Gillespie             Visit Date: 10/20/2022 11:38 am ---------------------------------------------------------------------- Performed By  Attending:        Lyndel Safe        Ref. Address:     [address]  [place]                                                             [place], [place]                                                             [address]  Performed By:     Otilio Jefferson BA       [facility]  Referred By:      [facility]          Visit Type:       OB                    for Women ---------------------------------------------------------------------- Orders  #  Description                           Code        Ordered By  1  US OB LESS THAN 14 WEEKS              [id number]     KELLY DAVIS     WITH OB TRANSVAGINAL ----------------------------------------------------------------------  #  Order #                     Accession #                Episode #  1  [id number]                   [id number]  [id number] ---------------------------------------------------------------------- Indications  [redacted] weeks gestation of pregnancy                 Z3A.08  Pregnancy with inconclusive fetal viability    O36.80X0  Twin pregnancy with loss (demise) of one       O30.009, O31.10X0  fetus, antepartum  Obesity complicating pregnancy                 O99.210 E66.9 ---------------------------------------------------------------------- Fetal Evaluation (Fetus A)  Num Of Fetuses:         2  Preg. Location:         Intrauterine  Gest. Sac:              Irregular  Yolk Sac:               Not visualized  Fetal Pole:             Not visualized  Fetal Heart Rate(bpm):  0  Cardiac Activity:       No embryo visualized  Comment:    LMP 08/22/2022 ---------------------------------------------------------------------- Biometry (Fetus A)  GS:       24.8  mm     G. Age:  7w 6d                   EDD:   06/02/23 ---------------------------------------------------------------------- Gestational Age (Fetus A)  Best:          8w 3d      Det. By:  U/S C R L Fetus B        EDD:   05/29/23                                      (10/20/22) ---------------------------------------------------------------------- Fetal Evaluation (Fetus B)  Num Of Fetuses:          2  Preg. Location:         Intrauterine  Gest. Sac:              Intrauterine  Yolk Sac:               Visualized  Fetal Pole:             Visualized  Fetal Heart Rate(bpm):  186  Cardiac Activity:       Tachycardia ---------------------------------------------------------------------- Biometry (Fetus B)  CRL:      19.7  mm     G. Age:  8w 3d                   EDD:   05/29/23 ---------------------------------------------------------------------- Gestational Age (Fetus B)  Best:          8w 3d      Det. By:  U/S C R L Fetus B        EDD:   05/29/23                                      (10/20/22) ---------------------------------------------------------------------- Cervix Uterus Adnexa  Uterus  Size(cm)    13.77   x   7.57   x  1.33  Uterus  No abnormality visualized.  Right Ovary  Size(cm)     3.69   x   2.86   x  2.64      Vol(ml): 14.59  Within normal limits.  Left Ovary  Not visualized.  Cul De Sac  Small amount of free fluid seen.  Adnexa  No abnormality visualized ---------------------------------------------------------------------- Impression  Twin pregnancy  Twin A- non viable, no fetal pole but clearly defined second  gestational sac  Twin B - Viable with normal FHR ---------------------------------------------------------------------- Recommendations  Repeat US in 10-14 days for viability of Twin B ----------------------------------------------------------------------               Lyndel Safe, MD Electronically Signed Final Report   10/20/2022 01:24 pm ----------------------------------------------------------------------    MAU Course  Procedures Lab Orders  No laboratory test(s) ordered today   Meds ordered this encounter  Medications   Prenatal 28-0.8 MG TABS    Sig: Take 1 tablet by mouth daily.    Dispense:  30 tablet    Refill:  12   Imaging Orders  No imaging studies ordered today    MDM Moderate (Level 3-4)  Assessment and Plan  # Abnormal pregnancy [place] #Twin gestation #[redacted]  weeks gestation of pregnancy Reviewed [place] images with patient. Discussed that based on appearance of [place] she has one viable pregnancy but the second twin appears to be non-viable. Discussed she will likely proceed to have a normal singleton pregnancy but risk of miscarriage likely slightly higher in this scenario. Reassured her that she does not need any blood work at present. She would like to engage with prenatal care at [facility], message sent to coordiante.    Dispo: discharged to home in stable condition   Venora Maples, MD/MPH 10/20/22 5:23 PM  Allergies as of 10/20/2022   No Known Allergies      Medication List     STOP taking these medications    chlorhexidine 4 % external liquid Commonly known as: HIBICLENS   clindamycin 1 % external solution Commonly known as: CLEOCIN T       TAKE these medications    ferrous sulfate 325 (65 FE) MG tablet Take 1 tablet (325 mg total) by mouth every other day.   Prenatal 28-0.8 MG Tabs Take 1 tablet by mouth daily.

## 2022-10-20 NOTE — Telephone Encounter (Signed)
Patient had her Korea today and she is requesting a call back to let her know if she will need lab work to determine if she is having twins.

## 2022-10-20 NOTE — Telephone Encounter (Signed)
Patient calls nurse line regarding concerns with ultrasound performed at [facility] today.   She reports that they are unsure of viability of twin pregnancy.   Unable to view ultrasound report at this time.   Patient is also experiencing spotting and 5/10 abdominal pain.   Spoke with Dr. Lum Babe regarding patient. Advised that patient be evaluated in MAU due to bleeding and abdominal pain.   Provided patient with these instructions. Patient verbalizes understanding.   Forwarding to Dr. Phineas Real who saw patient for initial visit.   Veronda Prude, RN

## 2022-10-20 NOTE — Telephone Encounter (Signed)
RN precepted case with me. OB U/S report not available at this time. MAU visit recommended. Will forward to PCP and Dr. Phineas Real to follow.

## 2022-10-21 NOTE — Telephone Encounter (Signed)
Patient seen at MAU on 10/20/22 and questions addressed by Crissie Reese, MD.

## 2022-10-24 ENCOUNTER — Encounter: Payer: Self-pay | Admitting: Family Medicine

## 2022-10-24 ENCOUNTER — Telehealth: Payer: Self-pay | Admitting: Family Medicine

## 2022-10-24 ENCOUNTER — Telehealth: Payer: Self-pay

## 2022-10-24 NOTE — Telephone Encounter (Signed)
Patient calls nurse line stating that she missed  a call regarding FMLA paperwork.   She was unsure if Dr. Phineas Real was calling with questions pertaining to this paperwork. She states that her mother's number was called.   Will forward to Dr. Phineas Real. Patient requests returned call at [phone].  Veronda Prude, RN

## 2022-10-24 NOTE — Telephone Encounter (Signed)
Called and spoke with patient about recent disability/fmla form request. Since request is for pregnancy, recommend patient complete this form with her OB. She is scheduled for appointments at [facility] on 10/23 and 10/28. Patient will connect with her employer about sharing the disability/fmla paperwork with her OB. Discussed seeking more immediate care as needed for bleeding symptoms.

## 2022-10-29 ENCOUNTER — Telehealth: Payer: Self-pay | Admitting: Family Medicine

## 2022-10-29 NOTE — Telephone Encounter (Signed)
Patient called in to get a ultrasound scheduled because she was told last week that she was having twins but only 1 shows viable, and was only given pictures of one. Patient stated she then went to the hospital because she was confused and scared.

## 2022-10-31 ENCOUNTER — Telehealth: Payer: Self-pay

## 2022-10-31 DIAGNOSIS — O3680X2 Pregnancy with inconclusive fetal viability, fetus 2: Secondary | ICD-10-CM

## 2022-10-31 NOTE — Telephone Encounter (Addendum)
Called patient back regarding [place] appointment that needs to be scheduled. Appointment was made for 11/10/22 at 9:25AM  for [place] viability. Informed patient she will be able to see appointment via Mychart.   [phone] Appointment changed to 11/10/22 at 10:15 AM. Will reach out to patient via Mychart message to notify of appointment change.  Marcelino Duster, RN

## 2022-10-31 NOTE — Addendum Note (Signed)
Addended byQuintella Reichert on: 10/31/2022 09:11 AM   Modules accepted: Orders

## 2022-11-05 ENCOUNTER — Telehealth: Payer: Medicaid Other | Admitting: General Practice

## 2022-11-05 DIAGNOSIS — O0991 Supervision of high risk pregnancy, unspecified, first trimester: Secondary | ICD-10-CM

## 2022-11-05 DIAGNOSIS — Z3A10 10 weeks gestation of pregnancy: Secondary | ICD-10-CM

## 2022-11-05 DIAGNOSIS — Z98891 History of uterine scar from previous surgery: Secondary | ICD-10-CM | POA: Insufficient documentation

## 2022-11-05 DIAGNOSIS — O24419 Gestational diabetes mellitus in pregnancy, unspecified control: Secondary | ICD-10-CM | POA: Insufficient documentation

## 2022-11-05 MED ORDER — BLOOD PRESSURE KIT DEVI
1.0000 | Freq: Once | 0 refills | Status: AC
Start: 2022-11-05 — End: 2022-11-05

## 2022-11-05 MED ORDER — GOJJI WEIGHT SCALE MISC
1.0000 | Freq: Once | 0 refills | Status: AC
Start: 2022-11-05 — End: 2022-11-05

## 2022-11-05 NOTE — Progress Notes (Signed)
New OB Intake  I connected with Denise Gillespie  on 11/05/22 at  1:15 PM EDT by MyChart Video Visit and verified that I am speaking with the correct person using two identifiers. Nurse is located at [facility] and pt is located at home.  I discussed the limitations, risks, security and privacy concerns of performing an evaluation and management service by telephone and the availability of in person appointments. I also discussed with the patient that there may be a patient responsible charge related to this service. The patient expressed understanding and agreed to proceed.  I explained I am completing New OB Intake today. We discussed EDD of 05/29/2023, by Last Menstrual Period. Pt is [id number]. I reviewed her allergies, medications and Medical/Surgical/OB history.    Patient Active Problem List   Diagnosis Date Noted   History of C-section 11/05/2022   History of gestational diabetes 03/27/2019   Alpha thalassemia trait 12/28/2018   LGSIL on Pap smear of cervix 12/03/2018   History of VBAC 11/30/2018   Supervision of high risk pregnancy in first trimester 11/16/2018   History of severe pre-eclampsia 11/16/2018   Maternal morbid obesity, antepartum (HCC)    Hidradenitis suppurativa 05/25/2017   Major depression, recurrent (HCC) 05/25/2017   Generalized anxiety disorder 05/25/2017    Concerns addressed today Pt would like sooner ultrasound to check on baby & she is started a new job on Monday during currently scheduled time. Rescheduled for tomorrow afternoon.   Delivery Plans Plans to deliver at [facility]. Discussed the nature of our practice with multiple providers including residents and students. Due to the size of the practice, the delivering provider may not be the same as those providing prenatal care.   MyChart/Babyscripts MyChart access verified. I explained pt will have some visits in office and some virtually. Babyscripts instructions given and order placed. Patient verifies  receipt of registration text/e-mail. Account successfully created and app downloaded.  Blood Pressure Cuff/Weight Scale Blood pressure cuff ordered for patient to pick-up from [employer]. Explained after first prenatal appt pt will check weekly and document in Babyscripts. Patient does not have weight scale; order sent to [facility], patient may track weight weekly in Babyscripts.  Anatomy US Explained first scheduled Korea will be around 19 weeks. Anatomy US scheduled for 12/20 at 10:15am.  Is patient a CenteringPregnancy candidate?  Patient is considering Centering & will let us know her thoughts at a later time.  Is patient a Mom+Baby Combined Care candidate?  Not a candidate    Interested in [place]? If yes, send referral and doula dot phrase.    Is patient a candidate for Babyscripts Optimization? No - high risk due to hx of pre-eclampsia  First visit review I reviewed new OB appt with patient. Explained pt will be seen by Dr Scheryl Darter at first visit. Discussed Avelina Laine genetic screening with patient. Panorama to be done at new OB appt. Horizon previously collected with last pregnancy. Routine prenatal labs  will be collected at new OB appt.    Last Pap Diagnosis  Date Value Ref Range Status  11/30/2018 - Low grade squamous intraepithelial lesion (LSIL) (A)  Final  Will get up to date next week.   Marylynn Pearson, RN 11/05/2022  1:30 PM

## 2022-11-06 ENCOUNTER — Ambulatory Visit: Payer: Medicaid Other

## 2022-11-06 ENCOUNTER — Other Ambulatory Visit: Payer: Self-pay

## 2022-11-06 DIAGNOSIS — O3680X2 Pregnancy with inconclusive fetal viability, fetus 2: Secondary | ICD-10-CM

## 2022-11-06 DIAGNOSIS — Z3A10 10 weeks gestation of pregnancy: Secondary | ICD-10-CM | POA: Diagnosis not present

## 2022-11-06 DIAGNOSIS — O0990 Supervision of high risk pregnancy, unspecified, unspecified trimester: Secondary | ICD-10-CM | POA: Diagnosis not present

## 2022-11-07 ENCOUNTER — Telehealth: Payer: Self-pay

## 2022-11-07 NOTE — Telephone Encounter (Addendum)
-----   Message from [facility] sent at 11/07/2022  8:28 AM EDT ----- Disregard previous message. Patient has a viable SINGLE pregnancy. One twin failed to develop  Very sorry for the previous miscommunication  Dr. Jolayne Panther   Called pt; reviewed [place] results of vanishing twin A and continued growth of twin B. Encouraged pt to ask follow up questions at new OB visit on Monday. EDD 05/29/22 by [place] x 2 which correlates with LMP. No change made to EDD.

## 2022-11-10 ENCOUNTER — Other Ambulatory Visit: Payer: Self-pay

## 2022-11-10 ENCOUNTER — Ambulatory Visit: Payer: Medicaid Other | Admitting: Obstetrics & Gynecology

## 2022-11-10 ENCOUNTER — Other Ambulatory Visit: Payer: Medicaid Other

## 2022-11-10 VITALS — BP 131/88 | HR 91 | Wt 286.2 lb

## 2022-11-10 DIAGNOSIS — O0991 Supervision of high risk pregnancy, unspecified, first trimester: Secondary | ICD-10-CM | POA: Diagnosis not present

## 2022-11-10 DIAGNOSIS — O99211 Obesity complicating pregnancy, first trimester: Secondary | ICD-10-CM | POA: Diagnosis not present

## 2022-11-10 DIAGNOSIS — Z98891 History of uterine scar from previous surgery: Secondary | ICD-10-CM

## 2022-11-10 DIAGNOSIS — Z8759 Personal history of other complications of pregnancy, childbirth and the puerperium: Secondary | ICD-10-CM

## 2022-11-10 DIAGNOSIS — Z3A11 11 weeks gestation of pregnancy: Secondary | ICD-10-CM

## 2022-11-10 DIAGNOSIS — Z8632 Personal history of gestational diabetes: Secondary | ICD-10-CM

## 2022-11-10 DIAGNOSIS — O99210 Obesity complicating pregnancy, unspecified trimester: Secondary | ICD-10-CM

## 2022-11-10 MED ORDER — ASPIRIN 81 MG PO TBEC
81.0000 mg | DELAYED_RELEASE_TABLET | Freq: Every day | ORAL | 12 refills | Status: DC
Start: 2022-11-10 — End: 2023-05-12

## 2022-11-10 NOTE — Progress Notes (Signed)
Subjective:    Denise Gillespie is a [id number] [redacted]w[redacted]d being seen today for her first obstetrical visit.  Her obstetrical history is significant for obesity and previous cesarean section . Patient does intend to breast feed. Pregnancy history fully reviewed.  Patient reports no complaints.  Vitals:   11/10/22 1454  BP: 131/88  Pulse: 91  Weight: 286 lb 3.2 oz (129.8 kg)    HISTORY: OB History  Gravida Para Term Preterm AB Living  5 3 2 1 1 3   SAB IAB Ectopic Multiple Live Births  0 1 0 0 3    # Outcome Date GA Lbr Len/2nd Weight Sex Type Anes PTL Lv  5 Current           4 Preterm 04/22/19 [redacted]w[redacted]d 05:46 / 00:09 6 lb 14.2 oz (3.124 kg) M VBAC EPI  LIV     Birth Comments: wnl     Complications: GDM (gestational diabetes mellitus), Preeclampsia  3 Term 10/15/08 107w0d  7 lb 14 oz (3.572 kg) F VBAC EPI  LIV     Birth Comments: Hypertension;   2 Term 05/11/07 [redacted]w[redacted]d  7 lb 6 oz (3.345 kg) F CS-Unspec EPI  LIV     Birth Comments: c/s due to breech, preeclampsia  1 IAB            Past Medical History:  Diagnosis Date   Abscess    multiple   Anemia, iron deficiency    Depression    Fibroid    Gestational diabetes    Hidradenitis    Obesity    Pregnancy induced hypertension    Past Surgical History:  Procedure Laterality Date   CESAREAN SECTION     INCISE AND DRAIN ABCESS     THERAPEUTIC ABORTION     Family History  Problem Relation Age of Onset   Arthritis Mother    Hypertension Mother    Migraines Mother    Depression Mother    Liver disease Mother        fatty liver   Lumbar disc disease Mother        degenerative   Eczema Brother    Stroke Maternal Grandmother      Exam    Uterus:   11 wk  Pelvic Exam: deferred   Perineum:    Vulva:    Vagina:     pH:    Cervix:    Adnexa:    Bony Pelvis:   System: Breast:  Inspection negative   Skin: normal coloration and turgor, no rashes    Neurologic: oriented, normal mood   Extremities: normal strength, tone,  and muscle mass   HEENT PERRLA   Mouth/Teeth mucous membranes moist, pharynx normal without lesions   Neck supple   Cardiovascular: regular rate and rhythm   Respiratory:  appears well, vitals normal, no respiratory distress, acyanotic, normal RR   Abdomen: soft, non-tender; bowel sounds normal; no masses,  no organomegaly   Urinary:       Assessment:    Pregnancy: [id number] Patient Active Problem List   Diagnosis Date Noted   History of C-section 11/05/2022   History of gestational diabetes 03/27/2019   Alpha thalassemia trait 12/28/2018   LGSIL on Pap smear of cervix 12/03/2018   History of VBAC 11/30/2018   Supervision of high risk pregnancy in first trimester 11/16/2018   History of severe pre-eclampsia 11/16/2018   Maternal morbid obesity, antepartum (HCC)    Hidradenitis suppurativa 05/25/2017   Major depression, recurrent (HCC)  05/25/2017   Generalized anxiety disorder 05/25/2017        Plan:     Initial labs drawn. Prenatal vitamins. Problem list reviewed and updated. Genetic Screening discussed : ordered.  Ultrasound discussed; fetal survey: ordered.  Follow up in 4 weeks. 50% of 30 min visit spent on counseling and coordination of care.     Scheryl Darter 11/10/2022

## 2022-11-11 LAB — CBC/D/PLT+RPR+RH+ABO+RUBIGG...
Antibody Screen: NEGATIVE
Basophils Absolute: 0 10*3/uL (ref 0.0–0.2)
Basos: 0 %
EOS (ABSOLUTE): 0.1 10*3/uL (ref 0.0–0.4)
Eos: 2 %
HCV Ab: NONREACTIVE
HIV Screen 4th Generation wRfx: NONREACTIVE
Hematocrit: 34.7 % (ref 34.0–46.6)
Hemoglobin: 11 g/dL — ABNORMAL LOW (ref 11.1–15.9)
Hepatitis B Surface Ag: NEGATIVE
Immature Grans (Abs): 0 10*3/uL (ref 0.0–0.1)
Immature Granulocytes: 0 %
Lymphocytes Absolute: 2.1 10*3/uL (ref 0.7–3.1)
Lymphs: 34 %
MCH: 25.9 pg — ABNORMAL LOW (ref 26.6–33.0)
MCHC: 31.7 g/dL (ref 31.5–35.7)
MCV: 82 fL (ref 79–97)
Monocytes Absolute: 0.3 10*3/uL (ref 0.1–0.9)
Monocytes: 5 %
Neutrophils Absolute: 3.6 10*3/uL (ref 1.4–7.0)
Neutrophils: 59 %
Platelets: 292 10*3/uL (ref 150–450)
RBC: 4.25 x10E6/uL (ref 3.77–5.28)
RDW: 15.6 % — ABNORMAL HIGH (ref 11.7–15.4)
RPR Ser Ql: NONREACTIVE
Rh Factor: POSITIVE
Rubella Antibodies, IGG: 1.41 {index} (ref >0.99)
WBC: 6.1 10*3/uL (ref 3.4–10.8)

## 2022-11-11 LAB — HEMOGLOBIN A1C
Est. average glucose Bld gHb Est-mCnc: 126 mg/dL
Hgb A1c MFr Bld: 6 % — ABNORMAL HIGH (ref 4.8–5.6)

## 2022-11-11 LAB — HCV INTERPRETATION

## 2022-11-18 ENCOUNTER — Telehealth: Payer: Self-pay

## 2022-11-18 LAB — HORIZON CUSTOM: REPORT SUMMARY: POSITIVE — AB

## 2022-11-18 NOTE — Telephone Encounter (Signed)
-----   Message from Scheryl Darter sent at 11/18/2022  1:48 PM EST ----- A1C was 6, please schedule early 2 hr GTT

## 2022-11-18 NOTE — Progress Notes (Signed)
A1C was 6, please schedule early 2 hr GTT

## 2022-11-18 NOTE — Telephone Encounter (Signed)
Called patient at number listed in chart--sent to unidentified VM. Left message to call office back in order to schedule additional testing related to most recent test results (per provider results note below).   Maureen Ralphs RN on 11/18/22 at [phone]

## 2022-11-19 NOTE — Telephone Encounter (Signed)
Called patient and informed of result. Patient verbalized understanding and scheduled for an early 2hr gtt on 11/27/2022.   B'Aisha, CMA

## 2022-11-20 ENCOUNTER — Telehealth: Payer: Self-pay | Admitting: Family Medicine

## 2022-11-20 NOTE — Telephone Encounter (Signed)
Per chart review results are not back. I called Denise Gillespie and explained results for panorama are not back and should be back anyday now . I asked if she is wanting to see them and she confirmed she does. I explained she should be able to see them the same time we do and we are expecting the results anyday now. 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 She reports she knew that from last pregnancy. I also explained she can set up genetic counseling if she desires. She also informed me the father of the baby passed Tuesday. I offered my condolences.  Nancy Fetter

## 2022-11-20 NOTE — Telephone Encounter (Signed)
Patient wants to know if her panorama results are in.

## 2022-11-22 LAB — PANORAMA PRENATAL TEST FULL PANEL:PANORAMA TEST PLUS 5 ADDITIONAL MICRODELETIONS: FETAL FRACTION: 4.1

## 2022-11-25 ENCOUNTER — Other Ambulatory Visit: Payer: Self-pay

## 2022-11-25 DIAGNOSIS — R7309 Other abnormal glucose: Secondary | ICD-10-CM

## 2022-11-27 ENCOUNTER — Other Ambulatory Visit: Payer: Medicaid Other

## 2022-12-03 ENCOUNTER — Encounter: Payer: Self-pay | Admitting: *Deleted

## 2022-12-03 ENCOUNTER — Encounter: Payer: Self-pay | Admitting: Obstetrics & Gynecology

## 2022-12-08 ENCOUNTER — Ambulatory Visit: Payer: Medicaid Other | Admitting: Obstetrics & Gynecology

## 2022-12-08 ENCOUNTER — Other Ambulatory Visit: Payer: Self-pay

## 2022-12-08 VITALS — BP 133/85 | HR 80 | Wt 286.0 lb

## 2022-12-08 DIAGNOSIS — Z3A13 13 weeks gestation of pregnancy: Secondary | ICD-10-CM

## 2022-12-08 DIAGNOSIS — O99341 Other mental disorders complicating pregnancy, first trimester: Secondary | ICD-10-CM

## 2022-12-08 DIAGNOSIS — Z8759 Personal history of other complications of pregnancy, childbirth and the puerperium: Secondary | ICD-10-CM

## 2022-12-08 DIAGNOSIS — O0991 Supervision of high risk pregnancy, unspecified, first trimester: Secondary | ICD-10-CM | POA: Diagnosis not present

## 2022-12-08 DIAGNOSIS — O34219 Maternal care for unspecified type scar from previous cesarean delivery: Secondary | ICD-10-CM

## 2022-12-08 DIAGNOSIS — Z8632 Personal history of gestational diabetes: Secondary | ICD-10-CM

## 2022-12-08 DIAGNOSIS — O09292 Supervision of pregnancy with other poor reproductive or obstetric history, second trimester: Secondary | ICD-10-CM

## 2022-12-08 DIAGNOSIS — F339 Major depressive disorder, recurrent, unspecified: Secondary | ICD-10-CM

## 2022-12-08 DIAGNOSIS — Z98891 History of uterine scar from previous surgery: Secondary | ICD-10-CM

## 2022-12-08 NOTE — Progress Notes (Signed)
Subjective:  Denise Gillespie is a 32 y.o. [phone] at [redacted]w[redacted]d being seen today for prenatal care. Patient reports headache and decreased appetite, however she stated this could be due to recent loss of FOB and child within the past week. She also reports increased urination and thirst, but has not completed early GTT due to recent events.  Contractions: Not present.  Vag. Bleeding: None.  . Denies leaking of fluid.   The following portions of the patient's history were reviewed and updated as appropriate: allergies, current medications, past family history, past medical history, past social history, past surgical history and problem list.   Objective:   Vitals:   12/08/22 1334  BP: 133/85  Pulse: 80  Weight: 286 lb (129.7 kg)    Fetal Status: Fetal Heart Rate (bpm): 160         General:  Alert, oriented and cooperative. Patient is in no acute distress.  Skin: Skin is warm and dry. No rash noted.   Cardiovascular: Normal heart rate noted  Respiratory: Normal respiratory effort, no problems with respiration noted  Abdomen: Soft, gravid, appropriate for gestational age. Pain/Pressure: Absent     Vaginal: Vag. Bleeding: None.       Cervix: Not evaluated        Extremities: Normal range of motion.  Edema: None  Mental Status: Normal mood and affect. Normal behavior. Normal judgment and thought content.   Urinalysis:      Assessment and Plan:  Pregnancy: [id number] at [redacted]w[redacted]d  1. Supervision of high risk pregnancy in first trimester - AFP, Serum, Open Spina Bifida  2. History of gestational diabetes - A1C was 6.0, will plan on early GTT  3. History of C-section   4. History of VBAC   5. Recurrent major depressive disorder, remission status unspecified (HCC)   6. History of severe pre-eclampsia - BP today was 133/85, no symptoms currently  Preterm labor symptoms and general obstetric precautions including but not limited to vaginal bleeding, contractions, leaking of fluid and  fetal movement were reviewed in detail with the patient. Please refer to After Visit Summary for other counseling recommendations.  Return in about 4 weeks (around 01/05/2023).   Adam Phenix, MD

## 2022-12-10 LAB — AFP, SERUM, OPEN SPINA BIFIDA
AFP MoM: 1.35
AFP Value: 24.7 ng/mL
Gest. Age on Collection Date: 15.3 wk
Maternal Age At EDD: 34.1 a
OSBR Risk 1 IN: 2478
Test Results:: NEGATIVE
Weight: 286 [lb_av]

## 2022-12-29 DIAGNOSIS — D563 Thalassemia minor: Secondary | ICD-10-CM | POA: Insufficient documentation

## 2023-01-02 ENCOUNTER — Ambulatory Visit: Payer: Medicaid Other | Admitting: *Deleted

## 2023-01-02 ENCOUNTER — Ambulatory Visit: Payer: Medicaid Other | Attending: Obstetrics & Gynecology

## 2023-01-02 ENCOUNTER — Ambulatory Visit: Payer: Medicaid Other | Admitting: Maternal & Fetal Medicine

## 2023-01-02 ENCOUNTER — Other Ambulatory Visit: Payer: Self-pay | Admitting: *Deleted

## 2023-01-02 VITALS — BP 121/64 | HR 86

## 2023-01-02 DIAGNOSIS — O99212 Obesity complicating pregnancy, second trimester: Secondary | ICD-10-CM

## 2023-01-02 DIAGNOSIS — Z3A19 19 weeks gestation of pregnancy: Secondary | ICD-10-CM

## 2023-01-02 DIAGNOSIS — O09292 Supervision of pregnancy with other poor reproductive or obstetric history, second trimester: Secondary | ICD-10-CM

## 2023-01-02 DIAGNOSIS — O99810 Abnormal glucose complicating pregnancy: Secondary | ICD-10-CM | POA: Diagnosis not present

## 2023-01-02 DIAGNOSIS — E669 Obesity, unspecified: Secondary | ICD-10-CM | POA: Diagnosis not present

## 2023-01-02 DIAGNOSIS — O4592 Premature separation of placenta, unspecified, second trimester: Secondary | ICD-10-CM | POA: Diagnosis not present

## 2023-01-02 DIAGNOSIS — O34219 Maternal care for unspecified type scar from previous cesarean delivery: Secondary | ICD-10-CM

## 2023-01-02 DIAGNOSIS — O0991 Supervision of high risk pregnancy, unspecified, first trimester: Secondary | ICD-10-CM | POA: Diagnosis present

## 2023-01-02 DIAGNOSIS — R7303 Prediabetes: Secondary | ICD-10-CM | POA: Diagnosis present

## 2023-01-02 DIAGNOSIS — D563 Thalassemia minor: Secondary | ICD-10-CM

## 2023-01-02 DIAGNOSIS — O99012 Anemia complicating pregnancy, second trimester: Secondary | ICD-10-CM | POA: Diagnosis not present

## 2023-01-02 HISTORY — DX: Prediabetes: R73.03

## 2023-01-02 NOTE — Progress Notes (Signed)
Patient information  Patient Name: Denise Gillespie  Patient MRN:   [id number]  Referring practice: MFM Referring Provider: [facility])  MFM CONSULT  Denise Gillespie is a 32 y.o. [id number] at [redacted]w[redacted]d here for ultrasound and consultation. Patient Active Problem List   Diagnosis Date Noted   Alpha thalassemia silent carrier 12/29/2022   History of C-section 11/05/2022   History of gestational diabetes 03/27/2019   History of VBAC 11/30/2018   Supervision of high risk pregnancy in first trimester 11/16/2018   History of severe pre-eclampsia 11/16/2018   Maternal morbid obesity, antepartum (HCC)    Hidradenitis suppurativa 05/25/2017   Major depression, recurrent (HCC) 05/25/2017   Generalized anxiety disorder 05/25/2017   RE prediabetes and hx of GDM: Early hemoglobin A1c was 6.  Recommend early glucose screening followed by repeat screening around 24 to 28 weeks if this is normal.  If she is diagnosed with gestational diabetes then she will need referral to diabetic education along with supplies to check blood sugars 4 times a day.  RE history of C-section and subsequent VBACx2: Plans to have another VBAC.  Previously counseled and understands the risks versus the benefits of a VBAC versus scheduled cesarean delivery.  RE history of preeclampsia: Developed hypertension and/or preeclampsia and all the pregnancies before this one. She is compliant with aspirin for preE ppx. Baseline preeclampsia labs   RE Subchorionic hemorrhage: Seen multiple times on previous on early ultrasounds. This was thought to  possibly represent an early twin demise but there was only one yolk sac and also her NIPT was normal (which is typically abnormal with an early twin demise). I discussed the risk of possible miscarriage as well as placental abruption.   Sonographic findings Single intrauterine pregnancy at 19w 0d  Fetal cardiac activity:  Observed and appears  normal. Presentation: Breech. The anatomic structures that were well seen appear normal without evidence of soft markers. Due to poor acoustic windows some structures remain suboptimally visualized. Fetal biometry shows the estimated fetal weight at the 49 percentile.  Amniotic fluid:  MVP: 4.44 cm.  Placenta: Anterior Fundal. Adnexa: No abnormality visualized. Cervical length: 3.7 cm.  Recommendations -EDD is Estimated Date of Delivery: 05/29/23 based on  U/S CRL Fetus B  (10/20/22). -Detailed ultrasound was done today without abnormalities but a significant amount of the anatomy is suboptimally visualized. -Early glucola -Baseline preeclampsia labs: CMP, CBC and urine protein/creatinine ratio if not previously completed.  -Double Aspirin to 162 mg for preeclampsia prophylasis -Follow-up anatomy and fetal growth in 4 to 6 weeks -Serial growth ultrasounds starting around 28 weeks to monitor for fetal growth restriction -Antenatal testing to start around 34 weeks due to the increased risk of stillbirth and high risk pregnancy -Delivery timing pending clinical course but likely around 39 weeks gestion -Continue routine prenatal care with referring OB provider  Review of Systems: A review of systems was performed and was negative except per HPI   Vitals and Physical Exam    01/02/2023   10:14 AM 12/08/2022    1:34 PM 11/10/2022    2:54 PM  Vitals with BMI  Weight  286 lbs 286 lbs 3 oz  BMI   55.89  Systolic 121 133 147  Diastolic 64 85 88  Pulse 86 80 91  Sitting comfortably on the sonogram table Nonlabored breathing Normal rate and rhythm Abdomen is nontender  Past pregnancies OB History  Gravida Para Term Preterm AB Living  5  3 2 1 1 3   SAB IAB Ectopic Multiple Live Births  0 1 0 0 3    # Outcome Date GA Lbr Len/2nd Weight Sex Type Anes PTL Lv  5 Current           4 Preterm 04/22/19 [redacted]w[redacted]d 05:46 / 00:09 3.124 kg M VBAC EPI  LIV     Birth Comments: wnl      Complications: GDM (gestational diabetes mellitus), Preeclampsia  3 Term 10/15/08 [redacted]w[redacted]d  3.572 kg F VBAC EPI  LIV     Birth Comments: Hypertension;   2 Term 05/11/07 [redacted]w[redacted]d  3.345 kg F CS-Unspec EPI  LIV     Birth Comments: c/s due to breech, preeclampsia  1 IAB             I spent 45 minutes reviewing the patients chart, including labs and images as well as counseling the patient about her medical conditions. Greater than 50% of the time was spent in direct face-to-face patient counseling.  Braxton Feathers  MFM, [facility]   01/02/2023  10:55 AM

## 2023-01-09 ENCOUNTER — Encounter: Payer: Self-pay | Admitting: Obstetrics & Gynecology

## 2023-01-14 NOTE — L&D Delivery Note (Signed)
 OB/GYN Faculty Practice Delivery Note  Denise Gillespie is a 33 y.o. [phone] s/p VBAC at [redacted]w[redacted]d. She was admitted for IOL in setting of gHTN with A2GDM.   ROM: 18h 13m with clear fluid GBS Status: PRESUMPTIVE NEGATIVE/-- (04/25 1912) Maximum Maternal Temperature: 98.3F   Labor Progress: Initial SVE: 1.5/50/-3. She then progressed to complete.   Delivery Date/Time: 05/10/23 0851 Delivery: Called to room and patient was complete and pushing. Head delivered direct OA. Loose nuchal x 2 reduced at the perineum. Shoulder and body delivered in usual fashion. Infant with spontaneous cry, placed on mother's abdomen, dried and stimulated. Cord clamped x 2 after 1-minute delay, and cut by maternal aunt, [name]. Cord blood drawn. Placenta delivered spontaneously with gentle cord traction. Fundus firm with massage and Pitocin , TXA prophylactically administered. Some trickling noted, so lower uterine sweep performed with evacuation of a few small clots. A foley catheter was inserted following delivery. Labia, perineum, vagina, and cervix inspected inspected with no tears.  Baby Weight: pending  Placenta: Sent to L&D Complications: None Lacerations: None EBL: 75 mL Analgesia: Epidural   Infant:  APGAR (1 MIN):  7 APGAR (5 MINS):  8  Melanie Spires, MD OB Fellow, Faculty Practice [facility], Center for [facility]

## 2023-01-16 ENCOUNTER — Other Ambulatory Visit: Payer: Medicaid Other

## 2023-01-16 DIAGNOSIS — R7309 Other abnormal glucose: Secondary | ICD-10-CM | POA: Diagnosis not present

## 2023-01-17 LAB — GLUCOSE TOLERANCE, 2 HOURS W/ 1HR
Glucose, 1 hour: 220 mg/dL — ABNORMAL HIGH (ref 70–179)
Glucose, 2 hour: 160 mg/dL — ABNORMAL HIGH (ref 70–152)
Glucose, Fasting: 108 mg/dL — ABNORMAL HIGH (ref 70–91)

## 2023-01-20 ENCOUNTER — Other Ambulatory Visit: Payer: Self-pay

## 2023-01-20 ENCOUNTER — Encounter: Payer: Self-pay | Admitting: Obstetrics and Gynecology

## 2023-01-20 ENCOUNTER — Ambulatory Visit: Payer: Medicaid Other | Admitting: Obstetrics and Gynecology

## 2023-01-20 VITALS — BP 122/88 | HR 95 | Wt 283.0 lb

## 2023-01-20 DIAGNOSIS — Z6841 Body Mass Index (BMI) 40.0 and over, adult: Secondary | ICD-10-CM

## 2023-01-20 DIAGNOSIS — Z8632 Personal history of gestational diabetes: Secondary | ICD-10-CM

## 2023-01-20 DIAGNOSIS — O0991 Supervision of high risk pregnancy, unspecified, first trimester: Secondary | ICD-10-CM

## 2023-01-20 DIAGNOSIS — Z8759 Personal history of other complications of pregnancy, childbirth and the puerperium: Secondary | ICD-10-CM

## 2023-01-20 DIAGNOSIS — Z98891 History of uterine scar from previous surgery: Secondary | ICD-10-CM

## 2023-01-20 DIAGNOSIS — O24419 Gestational diabetes mellitus in pregnancy, unspecified control: Secondary | ICD-10-CM

## 2023-01-20 DIAGNOSIS — Z3A21 21 weeks gestation of pregnancy: Secondary | ICD-10-CM

## 2023-01-20 DIAGNOSIS — O0992 Supervision of high risk pregnancy, unspecified, second trimester: Secondary | ICD-10-CM

## 2023-01-20 DIAGNOSIS — L732 Hidradenitis suppurativa: Secondary | ICD-10-CM

## 2023-01-20 MED ORDER — CLINDAMYCIN PHOSPHATE 1 % EX GEL: Freq: Two times a day (BID) | CUTANEOUS | 11 refills | Status: DC

## 2023-01-20 NOTE — Progress Notes (Signed)
   PRENATAL VISIT NOTE  Subjective:  Denise Gillespie is a 33 y.o. [phone] at [redacted]w[redacted]d being seen today for ongoing prenatal care.  She is currently monitored for the following issues for this high-risk pregnancy and has Hidradenitis suppurativa; Major depression, recurrent (HCC); Generalized anxiety disorder; Supervision of high risk pregnancy in first trimester; History of severe pre-eclampsia; Maternal morbid obesity, antepartum (HCC); History of VBAC; BMI 50.0-59.9, adult (HCC); GDM at 21wks; and Alpha thalassemia silent carrier on their problem list.  Patient reports  left sciatci pain and HS flares.  .  Contractions: Not present. Vag. Bleeding: None.  Movement: Present. Denies leaking of fluid.   The following portions of the patient's history were reviewed and updated as appropriate: allergies, current medications, past family history, past medical history, past social history, past surgical history and problem list.   Objective:   Vitals:   01/20/23 1413  BP: 122/88  Pulse: 95  Weight: 283 lb (128.4 kg)    Fetal Status: Fetal Heart Rate (bpm): 155   Movement: Present     General:  Alert, oriented and cooperative. Patient is in no acute distress.  Skin: Skin is warm and dry. No rash noted.   Cardiovascular: Normal heart rate noted  Respiratory: Normal respiratory effort, no problems with respiration noted  Abdomen: Soft, gravid, appropriate for gestational age.  Pain/Pressure: Absent     Pelvic: Cervical exam deferred        Extremities: Normal range of motion.  Edema: None  Mental Status: Normal mood and affect. Normal behavior. Normal judgment and thought content.   Assessment and Plan:  Pregnancy: [id number] at [redacted]w[redacted]d 1. BMI 50.0-59.9, adult (HCC) (Primary) Weight stable - Referral to Nutrition and Diabetes Services  2. Hidradenitis suppurativa Endorsing flares and was on doxy and clinda topical prior to pregnancy. D/w her recommend starting on clinda only topical 1% bid  and continue throughout pregnancy. Rx sent in  3. Supervision of high risk pregnancy in first trimester Continue on ASA. Can refer to PT if sciatic issues worsens, watch weight. Ask about contraception next visits.   4. History of VBAC D/w her later and tolac consent PRN  5. History of severe pre-eclampsia See above  6. Gestational diabetes mellitus (GDM) in second trimester, gestational diabetes method of control unspecified Dx on early GTT a few days ago and patient unaware. D/w her and she has a h/o GDM in the past. Patient amenable to referral but also comfortable starting to check her sugars now. Instructions on how to check and log sheet given and if any issues with getting supplies to let us  know ASAP - Referral to Nutrition and Diabetes Services  Preterm labor symptoms and general obstetric precautions including but not limited to vaginal bleeding, contractions, leaking of fluid and fetal movement were reviewed in detail with the patient. Please refer to After Visit Summary for other counseling recommendations.   Return in 17 days (on 02/06/2023) for in person, md visit, high risk ob.  Future Appointments  Date Time Provider Department Center  02/06/2023  9:15 AM [facility]URSE [facility]  02/06/2023  9:30 AM [facility] US3 [facility]    Bebe Furry, MD

## 2023-02-05 ENCOUNTER — Encounter: Payer: Self-pay | Admitting: Obstetrics & Gynecology

## 2023-02-06 ENCOUNTER — Other Ambulatory Visit: Payer: Self-pay | Admitting: *Deleted

## 2023-02-06 ENCOUNTER — Ambulatory Visit (INDEPENDENT_AMBULATORY_CARE_PROVIDER_SITE_OTHER): Payer: Medicaid Other | Admitting: Obstetrics & Gynecology

## 2023-02-06 ENCOUNTER — Ambulatory Visit: Payer: Medicaid Other | Attending: Obstetrics and Gynecology | Admitting: Obstetrics and Gynecology

## 2023-02-06 ENCOUNTER — Ambulatory Visit: Payer: Medicaid Other | Admitting: *Deleted

## 2023-02-06 ENCOUNTER — Other Ambulatory Visit: Payer: Self-pay

## 2023-02-06 ENCOUNTER — Ambulatory Visit: Payer: Medicaid Other | Attending: Maternal & Fetal Medicine

## 2023-02-06 VITALS — BP 121/68 | HR 86 | Wt 288.8 lb

## 2023-02-06 VITALS — BP 119/73 | HR 84

## 2023-02-06 DIAGNOSIS — O34219 Maternal care for unspecified type scar from previous cesarean delivery: Secondary | ICD-10-CM

## 2023-02-06 DIAGNOSIS — D563 Thalassemia minor: Secondary | ICD-10-CM

## 2023-02-06 DIAGNOSIS — O24419 Gestational diabetes mellitus in pregnancy, unspecified control: Secondary | ICD-10-CM

## 2023-02-06 DIAGNOSIS — O09292 Supervision of pregnancy with other poor reproductive or obstetric history, second trimester: Secondary | ICD-10-CM

## 2023-02-06 DIAGNOSIS — Z3A24 24 weeks gestation of pregnancy: Secondary | ICD-10-CM

## 2023-02-06 DIAGNOSIS — O99212 Obesity complicating pregnancy, second trimester: Secondary | ICD-10-CM

## 2023-02-06 DIAGNOSIS — O24415 Gestational diabetes mellitus in pregnancy, controlled by oral hypoglycemic drugs: Secondary | ICD-10-CM | POA: Diagnosis not present

## 2023-02-06 DIAGNOSIS — E669 Obesity, unspecified: Secondary | ICD-10-CM | POA: Diagnosis not present

## 2023-02-06 DIAGNOSIS — O99213 Obesity complicating pregnancy, third trimester: Secondary | ICD-10-CM

## 2023-02-06 DIAGNOSIS — Z6841 Body Mass Index (BMI) 40.0 and over, adult: Secondary | ICD-10-CM

## 2023-02-06 DIAGNOSIS — O4592 Premature separation of placenta, unspecified, second trimester: Secondary | ICD-10-CM

## 2023-02-06 DIAGNOSIS — O99013 Anemia complicating pregnancy, third trimester: Secondary | ICD-10-CM | POA: Diagnosis not present

## 2023-02-06 DIAGNOSIS — O0992 Supervision of high risk pregnancy, unspecified, second trimester: Secondary | ICD-10-CM

## 2023-02-06 DIAGNOSIS — O0991 Supervision of high risk pregnancy, unspecified, first trimester: Secondary | ICD-10-CM

## 2023-02-06 DIAGNOSIS — E6689 Other obesity not elsewhere classified: Secondary | ICD-10-CM

## 2023-02-06 MED ORDER — METFORMIN HCL 500 MG PO TABS: 500.0000 mg | ORAL_TABLET | Freq: Every day | ORAL | 5 refills | Status: DC

## 2023-02-06 NOTE — Progress Notes (Signed)
  Maternal-Fetal Medicine-Consultation  I had the pleasure of seeing Ms. Denise Gillespie today at the [facility]. She is G5 p2113 at 24 weeks' gestation and is here for completion of fetal anatomy. She has a new diagnosis of gestational diabetes.  At her prenatal visit today, she was prescribed metformin 500 mg to be taken at night. Patient reports her fasting levels range between 90 and 112 mg/dL and postprandial levels are below 125 mg/dL.  She checks her blood glucose 4 times daily. Blood pressure today at our office is 119/73 mmHg.  Obstetric history is significant for a cesarean delivery followed by 2 VBACs.  Her last 2 pregnancies were complicated by preeclampsia and she underwent induction of labor.  Ultrasound Fetal growth is appropriate for gestational age.  Amniotic fluid is normal good fetal activity seen.  Cardiac anatomy appears normal.  Fetal spine could not be evaluated because of fetal position.  Placenta is anterior and there is no evidence of previa or placenta accreta spectrum.  Our concerns include: Gestational diabetes I explained the diagnosis of gestational diabetes.  I emphasized the importance of good blood glucose control to prevent adverse fetal or neonatal outcomes.  I discussed normal range of blood glucose values. I encouraged her to check her blood glucose regularly.  Possible complications of gestational diabetes include fetal macrosomia, shoulder dystocia and birth injuries, stillbirth (in poorly controlled diabetes) and neonatal respiratory syndrome and other complications.  In about 85% of cases, gestational diabetes is well controlled by diet alone.  Exercise reduces the need for insulin.  Medical treatment includes oral hypoglycemics or insulin.  Timing of delivery: In well-controlled diabetes on metformin or insulin, patient can be delivered at 39-weeks' gestation. Vaginal delivery is not contraindicated. Type 2 diabetes develops in up  to 50% of women with GDM. I recommend postpartum screening with 75-g glucose load at 6 to 12 weeks after delivery. Previous cesarean delivery and history of VBAC (x2) Patient is a very good candidate for VBAC.  She is likely to undergo induction of labor because of diabetes.  Patient takes low-dose aspirin prophylaxis (she did not take in her last pregnancy), which is likely to reduce the chances of gestational hypertension/preeclampsia. Oxytocin induction only slightly increases the likelihood of uterine scar dehiscence (2%).  Recommendations -An appointment was made for her to return in 4 weeks for fetal growth assessment. -Weekly BPP from [redacted] weeks gestation till delivery.  Thank you for consultation.  If you have any questions or concerns, please contact me the [facility].  Consultation including face-to-face (more than 50%) counseling 30 minutes.

## 2023-02-06 NOTE — Progress Notes (Signed)
Pt reports migraines and lower abdominal pain

## 2023-02-06 NOTE — Progress Notes (Signed)
   PRENATAL VISIT NOTE  Subjective:  Denise Gillespie is a 33 y.o. [phone] at [redacted]w[redacted]d being seen today for ongoing prenatal care.  She is currently monitored for the following issues for this high-risk pregnancy and has Hidradenitis suppurativa; Major depression, recurrent (HCC); Generalized anxiety disorder; Supervision of high risk pregnancy in first trimester; History of severe pre-eclampsia; Maternal morbid obesity, antepartum (HCC); History of VBAC; BMI 50.0-59.9, adult (HCC); GDM (gestational diabetes mellitus); and Alpha thalassemia silent carrier on their problem list.  Patient reports headache.  Contractions: Irritability. Vag. Bleeding: None.  Movement: Present. Denies leaking of fluid.   The following portions of the patient's history were reviewed and updated as appropriate: allergies, current medications, past family history, past medical history, past social history, past surgical history and problem list.   Objective:   Vitals:   02/06/23 0821  BP: 121/68  Pulse: 86  Weight: 288 lb 12.8 oz (131 kg)    Fetal Status: Fetal Heart Rate (bpm): 161   Movement: Present     General:  Alert, oriented and cooperative. Patient is in no acute distress.  Skin: Skin is warm and dry. No rash noted.   Cardiovascular: Normal heart rate noted  Respiratory: Normal respiratory effort, no problems with respiration noted  Abdomen: Soft, gravid, appropriate for gestational age.  Pain/Pressure: Present     Pelvic: Cervical exam deferred        Extremities: Normal range of motion.  Edema: Trace  Mental Status: Normal mood and affect. Normal behavior. Normal judgment and thought content.   Assessment and Plan:  Pregnancy: [id number] at [redacted]w[redacted]d 1. [redacted] weeks gestation of pregnancy (Primary) FBS >100, add metformin - metFORMIN (GLUCOPHAGE) 500 MG tablet; Take 1 tablet (500 mg total) by mouth daily with supper.  Dispense: 60 tablet; Refill: 5  2. Supervision of high risk pregnancy in first  trimester [place] today  3. Gestational diabetes mellitus (GDM) in second trimester controlled on oral hypoglycemic drug   4. BMI 50.0-59.9, adult (HCC) Body mass index is 56.4 kg/m.   Preterm labor symptoms and general obstetric precautions including but not limited to vaginal bleeding, contractions, leaking of fluid and fetal movement were reviewed in detail with the patient. Please refer to After Visit Summary for other counseling recommendations.   Return in about 2 weeks (around 02/20/2023).  Future Appointments  Date Time Provider Department Center  02/06/2023  9:15 AM [facility] NURSE [facility]  02/06/2023  9:30 AM [facility] US3 [facility]    Scheryl Darter, MD

## 2023-03-03 ENCOUNTER — Other Ambulatory Visit: Payer: Self-pay

## 2023-03-03 DIAGNOSIS — O0991 Supervision of high risk pregnancy, unspecified, first trimester: Secondary | ICD-10-CM

## 2023-03-06 ENCOUNTER — Ambulatory Visit: Payer: Medicaid Other | Admitting: *Deleted

## 2023-03-06 ENCOUNTER — Other Ambulatory Visit: Payer: Medicaid Other

## 2023-03-06 ENCOUNTER — Ambulatory Visit: Payer: Medicaid Other | Admitting: Family Medicine

## 2023-03-06 ENCOUNTER — Ambulatory Visit: Payer: Medicaid Other | Admitting: Obstetrics

## 2023-03-06 ENCOUNTER — Ambulatory Visit: Payer: Medicaid Other | Attending: Obstetrics and Gynecology

## 2023-03-06 ENCOUNTER — Other Ambulatory Visit: Payer: Self-pay

## 2023-03-06 VITALS — BP 129/79 | HR 89 | Wt 293.0 lb

## 2023-03-06 VITALS — BP 141/81 | HR 83

## 2023-03-06 DIAGNOSIS — O24419 Gestational diabetes mellitus in pregnancy, unspecified control: Secondary | ICD-10-CM | POA: Diagnosis present

## 2023-03-06 DIAGNOSIS — Z3A28 28 weeks gestation of pregnancy: Secondary | ICD-10-CM | POA: Diagnosis not present

## 2023-03-06 DIAGNOSIS — O0991 Supervision of high risk pregnancy, unspecified, first trimester: Secondary | ICD-10-CM | POA: Diagnosis not present

## 2023-03-06 DIAGNOSIS — O0993 Supervision of high risk pregnancy, unspecified, third trimester: Secondary | ICD-10-CM

## 2023-03-06 DIAGNOSIS — O24415 Gestational diabetes mellitus in pregnancy, controlled by oral hypoglycemic drugs: Secondary | ICD-10-CM | POA: Insufficient documentation

## 2023-03-06 DIAGNOSIS — O34219 Maternal care for unspecified type scar from previous cesarean delivery: Secondary | ICD-10-CM

## 2023-03-06 DIAGNOSIS — O99213 Obesity complicating pregnancy, third trimester: Secondary | ICD-10-CM

## 2023-03-06 DIAGNOSIS — O4593 Premature separation of placenta, unspecified, third trimester: Secondary | ICD-10-CM | POA: Diagnosis not present

## 2023-03-06 DIAGNOSIS — O99013 Anemia complicating pregnancy, third trimester: Secondary | ICD-10-CM

## 2023-03-06 DIAGNOSIS — O99212 Obesity complicating pregnancy, second trimester: Secondary | ICD-10-CM | POA: Diagnosis present

## 2023-03-06 DIAGNOSIS — D563 Thalassemia minor: Secondary | ICD-10-CM | POA: Diagnosis not present

## 2023-03-06 DIAGNOSIS — E669 Obesity, unspecified: Secondary | ICD-10-CM | POA: Diagnosis not present

## 2023-03-06 DIAGNOSIS — Z98891 History of uterine scar from previous surgery: Secondary | ICD-10-CM

## 2023-03-06 DIAGNOSIS — O09293 Supervision of pregnancy with other poor reproductive or obstetric history, third trimester: Secondary | ICD-10-CM

## 2023-03-06 DIAGNOSIS — Z8759 Personal history of other complications of pregnancy, childbirth and the puerperium: Secondary | ICD-10-CM

## 2023-03-06 NOTE — Progress Notes (Signed)
 MFM Consult Note  Egan Sahlin is currently at 28 weeks and 0 days.  She has been followed due to gestational diabetes treated with metformin and maternal obesity with a BMI of 51.  She reports that her fasting fingerstick values have been in the high 90s to the low 100s range and her 2-hour postprandial fingerstick values have been in the high 120s to the low 130s range.  She denies any problems since her last exam.  She was informed that the fetal growth and amniotic fluid level appears appropriate for her gestational age.  The fetus remains in the breech presentation.  A large anterior fibroid was noted today.  She was advised that hopefully her fetus will have room to turn and convert to the vertex presentation later in her pregnancy despite this large fibroid.  She was advised that the goals for her fingerstick values are fasting values of 90-95 or less and two-hour postprandials of 120 or less.    The possibility of that she may have to be treated with insulin should her fingerstick values remain elevated was discussed.    Due to maternal obesity and gestational diabetes, we will start weekly fetal testing at 32 weeks.    She will return in 4 weeks for another growth scan and BPP.    The patient stated that all of her questions were answered today.  A total of 20 minutes was spent counseling and coordinating the care for this patient.  Greater than 50% of the time was spent in direct face-to-face contact.

## 2023-03-06 NOTE — Progress Notes (Signed)
   PRENATAL VISIT NOTE  Subjective:  Denise Gillespie is a 33 y.o. [phone] at [redacted]w[redacted]d being seen today for ongoing prenatal care.  She is currently monitored for the following issues for this high-risk pregnancy and has Hidradenitis suppurativa; Major depression, recurrent (HCC); Generalized anxiety disorder; Supervision of high risk pregnancy in first trimester; History of severe pre-eclampsia; Maternal morbid obesity, antepartum (HCC); History of VBAC; LGSIL on Pap smear of cervix; BMI 50.0-59.9, adult (HCC); GDM (gestational diabetes mellitus)-Metformin; and Alpha thalassemia silent carrier on their problem list.  Patient reports no complaints.  Contractions: Not present. Vag. Bleeding: None.  Movement: Present. Denies leaking of fluid.   The following portions of the patient's history were reviewed and updated as appropriate: allergies, current medications, past family history, past medical history, past social history, past surgical history and problem list.   Objective:   Vitals:   03/06/23 0835  BP: 129/79  Pulse: 89  Weight: 293 lb (132.9 kg)    Fetal Status: Fetal Heart Rate (bpm): 148 Fundal Height: 30 cm Movement: Present     General:  Alert, oriented and cooperative. Patient is in no acute distress.  Skin: Skin is warm and dry. No rash noted.   Cardiovascular: Normal heart rate noted  Respiratory: Normal respiratory effort, no problems with respiration noted  Abdomen: Soft, gravid, appropriate for gestational age.  Pain/Pressure: Absent     Pelvic: Cervical exam deferred        Extremities: Normal range of motion.  Edema: None  Mental Status: Normal mood and affect. Normal behavior. Normal judgment and thought content.   Assessment and Plan:  Pregnancy: [id number] at [redacted]w[redacted]d 1. [redacted] weeks gestation of pregnancy   2. Supervision of high risk pregnancy in first trimester (Primary) 28 week labs today  3. Gestational diabetes mellitus (GDM) in third trimester controlled on  oral hypoglycemic drug FBS 90-97 2 hour pp < 125 No log, left at work--reports EFW 48% On Metformin q hs  4. History of VBAC For VBAC again. Consent signed today with BTL papers  5. Maternal morbid obesity, antepartum (HCC) Discussed possibility of no pp BTL  6. History of severe pre-eclampsia Normotensive today  Preterm labor symptoms and general obstetric precautions including but not limited to vaginal bleeding, contractions, leaking of fluid and fetal movement were reviewed in detail with the patient. Please refer to After Visit Summary for other counseling recommendations.   Return in 2 weeks (on 03/20/2023).  Future Appointments  Date Time Provider Department Center  03/19/2023  8:55 AM Reva Bores, MD [facility]  04/02/2023  8:15 AM Adam Phenix, MD [facility]  04/03/2023  8:15 AM [facility] NURSE [facility]  04/03/2023  8:30 AM [facility] US4 [facility]  04/10/2023  8:15 AM [facility]URSE [facility]  04/10/2023  8:30 AM [facility] US4 [facility]  04/17/2023  8:15 AM Adam Phenix, MD [facility]  04/29/2023  8:15 AM Adam Phenix, MD [facility]  05/07/2023  8:15 AM Adam Phenix, MD [facility]    Reva Bores, MD

## 2023-03-07 LAB — CBC
Hematocrit: 32.8 % — ABNORMAL LOW (ref 34.0–46.6)
Hemoglobin: 10.4 g/dL — ABNORMAL LOW (ref 11.1–15.9)
MCH: 27.1 pg (ref 26.6–33.0)
MCHC: 31.7 g/dL (ref 31.5–35.7)
MCV: 85 fL (ref 79–97)
Platelets: 258 10*3/uL (ref 150–450)
RBC: 3.84 x10E6/uL (ref 3.77–5.28)
RDW: 13.6 % (ref 11.7–15.4)
WBC: 9.5 10*3/uL (ref 3.4–10.8)

## 2023-03-07 LAB — HIV ANTIBODY (ROUTINE TESTING W REFLEX): HIV Screen 4th Generation wRfx: NONREACTIVE

## 2023-03-07 LAB — RPR: RPR Ser Ql: NONREACTIVE

## 2023-03-13 ENCOUNTER — Encounter: Payer: Medicaid Other | Admitting: Obstetrics and Gynecology

## 2023-03-18 ENCOUNTER — Telehealth: Payer: Self-pay

## 2023-03-18 NOTE — Telephone Encounter (Addendum)
 Called patient and offered patient lab appointment for today per our physician. Patient states that she would like to wait until tomorrow during her prenatal appointment to get drawn.   Patient confirmed schedule prenatal appointment for tomorrow, Thursday, 03/19/23 at 8:55.   Patient denies any other needs at this time.  Marcelino Duster, RN  ----- Message from Reva Bores sent at 03/12/2023  7:01 PM EST ----- Yes, please ----- Message ----- From: Jill Side, RN Sent: 03/12/2023   3:51 PM EST To: Reva Bores, MD  Anemia panel cannot be added to the specimens which were drawn on 2/21. They were refrigerated specimens and she needs room temp specimen. Do you want her to come in for a lab appt to have done?    Diane ----- Message ----- From: Reva Bores, MD Sent: 03/07/2023   3:25 PM EST To: [facility] Clinical Pool  Add anemia panel please.

## 2023-03-19 ENCOUNTER — Telehealth: Payer: Self-pay | Admitting: Family Medicine

## 2023-03-19 ENCOUNTER — Encounter: Payer: Medicaid Other | Admitting: Family Medicine

## 2023-03-19 ENCOUNTER — Encounter: Payer: Self-pay | Admitting: Family Medicine

## 2023-03-19 NOTE — Telephone Encounter (Signed)
 Error- please disregard

## 2023-03-19 NOTE — Progress Notes (Signed)
Patient did not keep appointment today. She will be called to reschedule.  

## 2023-03-30 ENCOUNTER — Telehealth: Payer: Self-pay | Admitting: Lactation Services

## 2023-03-30 ENCOUNTER — Encounter: Payer: Self-pay | Admitting: Obstetrics & Gynecology

## 2023-03-30 ENCOUNTER — Telehealth: Payer: Self-pay

## 2023-03-30 DIAGNOSIS — O0990 Supervision of high risk pregnancy, unspecified, unspecified trimester: Secondary | ICD-10-CM

## 2023-03-30 DIAGNOSIS — O24415 Gestational diabetes mellitus in pregnancy, controlled by oral hypoglycemic drugs: Secondary | ICD-10-CM

## 2023-03-30 DIAGNOSIS — Z8759 Personal history of other complications of pregnancy, childbirth and the puerperium: Secondary | ICD-10-CM

## 2023-03-30 NOTE — Telephone Encounter (Signed)
 Encounter addressed in incoming phone call today 3/17

## 2023-03-30 NOTE — Telephone Encounter (Signed)
 Patient called front office requesting to speak with RN regarding concerns of positive COVID test. Patient reports testing positive with COVID yesterday. Notified patient that I can provide a list of safe medication she can take during pregnancy to help with symptom management. Encourage rest, hydration and quarantine for 7 days. Reviewed MAU precautions with patient. Patient verbalized understanding. Sent message to front office to change patient's 3/20 appointment to virtual.

## 2023-03-30 NOTE — Telephone Encounter (Signed)
 Called and spoke with patient following Mychart message.   She reports she has Covid, and was determined this morning. She started with a sore throat on Saturday. She reports her whole body hurts. She says she cannot taste or smell anything. She has a bad cough.   She reports she is having sharp pains in her lower abdomen. They occur with and without coughing. They are painful and reports she cannot move when they occur for several seconds.   She does not feel like they are contractions. She is voiding a lot and has diarrhea also. The voiding was there previously and the diarrhea related to the Covid. She has been on Metformin about a month.   Baby is usually pretty active, he is not as active as he was.  Reviewed that if she lays on her left side and is not feeling at least 10 kicks an hour to go to the Maternity Assessment Unit for evaluation.   She denies vaginal leaking.   She has pain to upper abdominal to the right side under the breast.   Maternity Wannetta Sender is not working, it causes her more pressure and her feet are swelling.   Feet are swelling above the ankles and is pitting per patient. BP this morning was 131/85.   She is having a lot of pressure at work. She wants to know about coming out of work. Reviewed if takes time prenatally, may not get postnatal care. She reports she is covered for 26 weeks per her HR. Reviewed speaking with OB at next visit.   Reviewed with Dr. Alvester Morin, She would like to have patient come in for BP check and Preeclampsia labs this week. Reviewed with patient.   Also reviewed with patient that if infant not moving well, her pain increases, having contractions, vaginal bleeding or leaking, go to MAU immediately.   Reviewed that it is best that she come to her appointments as scheduled so we can assist her in the future.

## 2023-04-02 ENCOUNTER — Telehealth: Payer: Medicaid Other | Admitting: Obstetrics & Gynecology

## 2023-04-02 ENCOUNTER — Ambulatory Visit

## 2023-04-02 VITALS — BP 131/97 | HR 86

## 2023-04-02 DIAGNOSIS — O24415 Gestational diabetes mellitus in pregnancy, controlled by oral hypoglycemic drugs: Secondary | ICD-10-CM | POA: Diagnosis not present

## 2023-04-02 DIAGNOSIS — O0993 Supervision of high risk pregnancy, unspecified, third trimester: Secondary | ICD-10-CM

## 2023-04-02 DIAGNOSIS — Z3A31 31 weeks gestation of pregnancy: Secondary | ICD-10-CM

## 2023-04-02 DIAGNOSIS — O0991 Supervision of high risk pregnancy, unspecified, first trimester: Secondary | ICD-10-CM

## 2023-04-02 MED ORDER — AMOXICILLIN-POT CLAVULANATE 875-125 MG PO TABS: 1.0000 | ORAL_TABLET | Freq: Two times a day (BID) | ORAL | 0 refills | Status: DC

## 2023-04-02 NOTE — Progress Notes (Unsigned)
 I connected with Denise Gillespie 04/02/23 at  8:15 AM EDT by: MyChart video and verified that I am speaking with the correct person using two identifiers.  Patient is located at home and provider is located at [facility].     I discussed the limitations, risks, security and privacy concerns of performing an evaluation and management service by MyChart video and the availability of in person appointments. I also discussed with the patient that there may be a patient responsible charge related to this service. By engaging in this virtual visit, you consent to the provision of healthcare.  Additionally, you authorize for your insurance to be billed for the services provided during this visit.  The patient expressed understanding and agreed to proceed.  The following staff members participated in the virtual visit:  Carrie     PRENATAL VISIT NOTE  Subjective:  Denise Gillespie is a 33 y.o. [id number] at [redacted]w[redacted]d  for virtual visit for ongoing prenatal care.  She is currently monitored for the following issues for this high-risk pregnancy and has Hidradenitis suppurativa; Major depression, recurrent (HCC); Generalized anxiety disorder; Supervision of high risk pregnancy in first trimester; History of severe pre-eclampsia; Maternal morbid obesity, antepartum (HCC); History of VBAC; LGSIL on Pap smear of cervix; BMI 50.0-59.9, adult (HCC); GDM (gestational diabetes mellitus)-Metformin; and Alpha thalassemia silent carrier on their problem list.  Patient reports  Covid sx for 3 days .  Contractions: Not present. Vag. Bleeding: None.  Movement: Present. Denies leaking of fluid.  Sore throat, possible strep The following portions of the patient's history were reviewed and updated as appropriate: allergies, current medications, past family history, past medical history, past social history, past surgical history and problem list.   Objective:   Vitals:   04/02/23 0831  BP: (!) 131/97  Pulse: 86    Self-Obtained  Fetal Status:     Movement: Present     Assessment and Plan:  Pregnancy: [name] at [redacted]w[redacted]d 1. Gestational diabetes mellitus (GDM) in third trimester controlled on oral hypoglycemic drug (Primary) Continue present mgmt  2. Supervision of high risk pregnancy in first trimester Possible GHTN  Preterm labor symptoms and general obstetric precautions including but not limited to vaginal bleeding, contractions, leaking of fluid and fetal movement were reviewed in detail with the patient.  Return in about 4 days (around 04/06/2023).  Future Appointments  Date Time Provider Department Center  04/02/2023  3:00 PM [facility] NURSE [facility]  04/03/2023  8:15 AM [facility] NURSE [facility]  04/03/2023  8:30 AM [facility] US4 [facility]  04/10/2023  8:15 AM [facility] NURSE [facility]  04/10/2023  8:30 AM [facility] US4 [facility]  04/17/2023  8:15 AM Adam Phenix, MD [facility]  04/29/2023  8:15 AM Adam Phenix, MD [facility]  05/07/2023  8:15 AM Adam Phenix, MD [facility]     Time spent on virtual visit: 11 minutes  Scheryl Darter, MD

## 2023-04-02 NOTE — Patient Instructions (Signed)

## 2023-04-03 ENCOUNTER — Ambulatory Visit: Payer: Medicaid Other

## 2023-04-06 ENCOUNTER — Telehealth: Payer: Self-pay

## 2023-04-06 NOTE — Telephone Encounter (Signed)
 Pelvic pressure for past few weeks. Mildly worse today. Noticed mucus vaginal discharge. Yellow discharge, with some pink, stringy.

## 2023-04-07 ENCOUNTER — Ambulatory Visit

## 2023-04-10 ENCOUNTER — Other Ambulatory Visit: Payer: Self-pay | Admitting: *Deleted

## 2023-04-10 ENCOUNTER — Ambulatory Visit: Payer: Medicaid Other | Attending: Obstetrics and Gynecology

## 2023-04-10 ENCOUNTER — Ambulatory Visit

## 2023-04-10 ENCOUNTER — Ambulatory Visit: Payer: Medicaid Other

## 2023-04-10 DIAGNOSIS — Z3A33 33 weeks gestation of pregnancy: Secondary | ICD-10-CM | POA: Diagnosis not present

## 2023-04-10 DIAGNOSIS — O24415 Gestational diabetes mellitus in pregnancy, controlled by oral hypoglycemic drugs: Secondary | ICD-10-CM

## 2023-04-10 DIAGNOSIS — O24419 Gestational diabetes mellitus in pregnancy, unspecified control: Secondary | ICD-10-CM | POA: Insufficient documentation

## 2023-04-10 DIAGNOSIS — O34219 Maternal care for unspecified type scar from previous cesarean delivery: Secondary | ICD-10-CM

## 2023-04-10 DIAGNOSIS — O99212 Obesity complicating pregnancy, second trimester: Secondary | ICD-10-CM | POA: Insufficient documentation

## 2023-04-10 DIAGNOSIS — E669 Obesity, unspecified: Secondary | ICD-10-CM | POA: Diagnosis not present

## 2023-04-10 DIAGNOSIS — D563 Thalassemia minor: Secondary | ICD-10-CM | POA: Diagnosis not present

## 2023-04-10 DIAGNOSIS — O99213 Obesity complicating pregnancy, third trimester: Secondary | ICD-10-CM

## 2023-04-13 ENCOUNTER — Other Ambulatory Visit: Payer: Self-pay | Admitting: *Deleted

## 2023-04-13 DIAGNOSIS — O24415 Gestational diabetes mellitus in pregnancy, controlled by oral hypoglycemic drugs: Secondary | ICD-10-CM

## 2023-04-13 DIAGNOSIS — O99213 Obesity complicating pregnancy, third trimester: Secondary | ICD-10-CM

## 2023-04-16 ENCOUNTER — Ambulatory Visit: Admitting: General Practice

## 2023-04-16 ENCOUNTER — Other Ambulatory Visit: Payer: Self-pay | Admitting: *Deleted

## 2023-04-16 ENCOUNTER — Inpatient Hospital Stay (HOSPITAL_COMMUNITY)
Admission: AD | Admit: 2023-04-16 | Discharge: 2023-04-16 | Disposition: A | Discharge status: Home / Self Care | Attending: Obstetrics and Gynecology | Admitting: Obstetrics and Gynecology

## 2023-04-16 ENCOUNTER — Encounter: Payer: Self-pay | Admitting: General Practice

## 2023-04-16 ENCOUNTER — Other Ambulatory Visit: Payer: Self-pay

## 2023-04-16 ENCOUNTER — Encounter (HOSPITAL_COMMUNITY): Payer: Self-pay | Admitting: Obstetrics and Gynecology

## 2023-04-16 VITALS — BP 157/70 | HR 83 | Ht 60.0 in | Wt 296.0 lb

## 2023-04-16 DIAGNOSIS — Z3A33 33 weeks gestation of pregnancy: Secondary | ICD-10-CM | POA: Insufficient documentation

## 2023-04-16 DIAGNOSIS — O0991 Supervision of high risk pregnancy, unspecified, first trimester: Secondary | ICD-10-CM

## 2023-04-16 DIAGNOSIS — Z8759 Personal history of other complications of pregnancy, childbirth and the puerperium: Secondary | ICD-10-CM

## 2023-04-16 DIAGNOSIS — O113 Pre-existing hypertension with pre-eclampsia, third trimester: Secondary | ICD-10-CM | POA: Diagnosis present

## 2023-04-16 DIAGNOSIS — O24415 Gestational diabetes mellitus in pregnancy, controlled by oral hypoglycemic drugs: Secondary | ICD-10-CM | POA: Insufficient documentation

## 2023-04-16 DIAGNOSIS — O133 Gestational [pregnancy-induced] hypertension without significant proteinuria, third trimester: Secondary | ICD-10-CM | POA: Insufficient documentation

## 2023-04-16 DIAGNOSIS — Z0130 Encounter for examination of blood pressure without abnormal findings: Secondary | ICD-10-CM

## 2023-04-16 DIAGNOSIS — O09293 Supervision of pregnancy with other poor reproductive or obstetric history, third trimester: Secondary | ICD-10-CM | POA: Insufficient documentation

## 2023-04-16 DIAGNOSIS — Z6841 Body Mass Index (BMI) 40.0 and over, adult: Secondary | ICD-10-CM

## 2023-04-16 DIAGNOSIS — O26893 Other specified pregnancy related conditions, third trimester: Secondary | ICD-10-CM

## 2023-04-16 LAB — URINALYSIS, ROUTINE W REFLEX MICROSCOPIC
Bilirubin Urine: NEGATIVE
Glucose, UA: NEGATIVE mg/dL
Hgb urine dipstick: NEGATIVE
Ketones, ur: NEGATIVE mg/dL
Leukocytes,Ua: NEGATIVE
Nitrite: NEGATIVE
Protein, ur: NEGATIVE mg/dL
Specific Gravity, Urine: 1.025 (ref 1.005–1.030)
pH: 6 (ref 5.0–8.0)

## 2023-04-16 LAB — COMPREHENSIVE METABOLIC PANEL WITH GFR
ALT: 8 U/L (ref 0–44)
AST: 13 U/L — ABNORMAL LOW (ref 15–41)
Albumin: 2.7 g/dL — ABNORMAL LOW (ref 3.5–5.0)
Alkaline Phosphatase: 37 U/L — ABNORMAL LOW (ref 38–126)
Anion gap: 8 (ref 5–15)
BUN: 5 mg/dL — ABNORMAL LOW (ref 6–20)
CO2: 22 mmol/L (ref 22–32)
Calcium: 9.1 mg/dL (ref 8.9–10.3)
Chloride: 105 mmol/L (ref 98–111)
Creatinine, Ser: 0.46 mg/dL (ref 0.44–1.00)
GFR, Estimated: >60 mL/min (ref >60)
Glucose, Bld: 110 mg/dL — ABNORMAL HIGH (ref 70–99)
Potassium: 3.6 mmol/L (ref 3.5–5.1)
Sodium: 135 mmol/L (ref 135–145)
Total Bilirubin: 0.5 mg/dL (ref 0.0–1.2)
Total Protein: 6.2 g/dL — ABNORMAL LOW (ref 6.5–8.1)

## 2023-04-16 LAB — PROTEIN / CREATININE RATIO, URINE
Creatinine, Urine: 153 mg/dL
Protein Creatinine Ratio: 0.07 mg/mg{creat} (ref 0.00–0.15)
Total Protein, Urine: 10 mg/dL

## 2023-04-16 LAB — CBC
HCT: 30.7 % — ABNORMAL LOW (ref 36.0–46.0)
Hemoglobin: 9.9 g/dL — ABNORMAL LOW (ref 12.0–15.0)
MCH: 26.8 pg (ref 26.0–34.0)
MCHC: 32.2 g/dL (ref 30.0–36.0)
MCV: 83.2 fL (ref 80.0–100.0)
Platelets: 241 10*3/uL (ref 150–400)
RBC: 3.69 MIL/uL — ABNORMAL LOW (ref 3.87–5.11)
RDW: 14.6 % (ref 11.5–15.5)
WBC: 8.4 10*3/uL (ref 4.0–10.5)
nRBC: 0 % (ref 0.0–0.2)

## 2023-04-16 MED ORDER — LACTATED RINGERS IV BOLUS: 1000.0000 mL | Freq: Once | INTRAVENOUS | Status: DC

## 2023-04-16 MED ORDER — ACETAMINOPHEN-CAFFEINE 500-65 MG PO TABS
2.0000 | ORAL_TABLET | Freq: Once | ORAL | Status: AC
  Administered 2023-04-16: 2 via ORAL
  Filled 2023-04-16: qty 2

## 2023-04-16 NOTE — MAU Note (Signed)
.  Denise Gillespie is a 33 y.o. at [redacted]w[redacted]d here in MAU reporting: Sent over from the office for PEC eval. She reports she woke up with a HA today and has been experiencing intermittent blurred vision. She denies RUQ/epigastric pain and edema. Denies VB or LOF. +FM.  -GHTN. Not taking any meds. GDM. VBAC x2. -Patient has a history of severe Pre-E at 36w.  Onset of complaint: This morning Pain score: 6/10 HA  FHT: 145 initial external Lab orders placed from triage: placed by MD

## 2023-04-16 NOTE — MAU Provider Note (Signed)
 Chief Complaint:  Hypertension and Headache   HPI   None     Denise Gillespie is a 33 y.o. [phone] at [redacted]w[redacted]d who presents to maternity admissions for elevated blood pressures and preeclampsia rule out. She presented to the office today, elevated blood pressure there 157/70 and complaints of frequent headaches. Denies dizziness, blurry vision, RUQ pain. She has a history of previous pregnancy with preeclampsia resulting in early delivery and BMI >30 that increases the risk of preeclampsia. Trace swelling noted in the office as well. At MAU, reports waking up with a 4/10 headache today with intermittent blurred vision this morning. Headache has gradually worsened and is now 6/10, unilateral behind her right eye. She has not had anything to eat or drink today other than a few sips of water with her morning medication. Her children have had the flu this past week and she is exhausted from taking care of them. Denies contractions, vaginal bleeding, discharge, or leaking of fluids. Reports good fetal movement.   Pregnancy Course: Receives care at [facility]. Prenatal records reviewed. High risk pregnancy monitored, patient also has GDM on metformin.  Past Medical History:  Diagnosis Date   Abscess    multiple   Anemia, iron deficiency    Depression    Fibroid    Gestational diabetes    Hidradenitis    History of gestational diabetes 03/27/2019   Dx on 3/12     Obesity    Prediabetes 01/02/2023   Pregnancy induced hypertension    OB History  Gravida Para Term Preterm AB Living  5 3 2 1 1 3   SAB IAB Ectopic Multiple Live Births  0 1 0 0 3    # Outcome Date GA Lbr Len/2nd Weight Sex Type Anes PTL Lv  5 Current           4 Preterm 04/22/19 [redacted]w[redacted]d 05:46 / 00:09 3124 g M VBAC EPI  LIV     Birth Comments: wnl     Complications: GDM (gestational diabetes mellitus), Preeclampsia  3 Term 10/15/08 [redacted]w[redacted]d  3572 g F VBAC EPI  LIV     Birth Comments: Hypertension;   2 Term 05/11/07 [redacted]w[redacted]d  3345 g F  CS-Unspec EPI  LIV     Birth Comments: c/s due to breech, preeclampsia  1 IAB            Past Surgical History:  Procedure Laterality Date   CESAREAN SECTION     INCISE AND DRAIN ABCESS     THERAPEUTIC ABORTION     Family History  Problem Relation Age of Onset   Diabetes Mother    Arthritis Mother    Hypertension Mother    Migraines Mother    Depression Mother    Liver disease Mother        fatty liver   Lumbar disc disease Mother        degenerative   Eczema Brother    Heart disease Paternal Aunt    Diabetes Paternal Aunt    Heart disease Maternal Grandmother    Stroke Maternal Grandmother    Asthma Neg Hx    Cancer Neg Hx    Social History   Tobacco Use   Smoking status: Former    Current packs/day: 0.00    Types: Cigarettes    Quit date: 09/18/2018    Years since quitting: 4.5   Smokeless tobacco: Never   Tobacco comments:    quit when found out pregnant   Vaping Use  Vaping status: Former   Quit date: 01/13/2020   Substances: Nicotine, Flavoring  Substance Use Topics   Alcohol use: Not Currently    Alcohol/week: 5.0 standard drinks of alcohol    Types: 5 Glasses of wine per week    Comment: -   Drug use: Not Currently    Types: Marijuana    Comment: last use 15 yrs ago   No Known Allergies Medications Prior to Admission  Medication Sig Dispense Refill Last Dose/Taking   acetaminophen (TYLENOL) 500 MG tablet Take 500 mg by mouth every 6 (six) hours as needed.   Past Week   aspirin EC 81 MG tablet Take 1 tablet (81 mg total) by mouth daily. Swallow whole. 30 tablet 12 04/15/2023 Morning   FEROSUL 325 (65 Fe) MG tablet Take 325 mg by mouth every other day.   04/16/2023 Morning   Prenatal 28-0.8 MG TABS Take 1 tablet by mouth daily. 30 tablet 12 04/16/2023 Morning   metFORMIN (GLUCOPHAGE) 500 MG tablet Take 1 tablet (500 mg total) by mouth daily with supper. (Patient not taking: Reported on 04/16/2023) 60 tablet 5     I have reviewed patient's Past Medical Hx,  Surgical Hx, Family Hx, Social Hx, medications and allergies.   ROS  Pertinent items noted in HPI and remainder of comprehensive ROS otherwise negative.   PHYSICAL EXAM  Patient Vitals for the past 24 hrs:  BP Temp Temp src Pulse Resp SpO2 Height Weight  04/16/23 1145 133/82 -- -- 81 -- -- -- --  04/16/23 1130 122/69 -- -- 83 -- 97 % -- --  04/16/23 1115 118/70 -- -- 84 -- 96 % -- --  04/16/23 1100 121/71 -- -- 92 -- 98 % -- --  04/16/23 1045 132/77 -- -- 92 -- 98 % -- --  04/16/23 1037 (!) 139/91 -- -- 79 -- -- -- --  04/16/23 1023 138/85 98.7 F (37.1 C) Oral 90 18 99 % -- --  04/16/23 1007 -- -- -- -- -- -- 5' (1.524 m) 135.4 kg    Constitutional: Well-developed, well-nourished female in no acute distress.  Cardiovascular: normal rate & rhythm, warm and well-perfused Respiratory: normal effort, no problems with respiration noted GI: Abd soft, non-tender, non-distended MSK: Extremities nontender, no edema, normal ROM Skin: warm and dry. Acyanotic, no jaundice or pallor. Neurologic: Alert and oriented x 4. No abnormal coordination. Psychiatric: Normal mood. Speech not slurred, not rapid/pressured. Patient is cooperative. GU: no CVA tenderness     Fetal Tracing: Baseline FHR: 150 per minute Fetal heart variability: moderate Fetal Heart Rate accelerations: yes Fetal Heart Rate decelerations: none Fetal Non-stress Test: reactive Toco: uterine irritability  Labs: Results for orders placed or performed during the hospital encounter of 04/16/23 (from the past 24 hours)  CBC     Status: Abnormal   Collection Time: 04/16/23 10:27 AM  Result Value Ref Range   WBC 8.4 4.0 - 10.5 K/uL   RBC 3.69 (L) 3.87 - 5.11 MIL/uL   Hemoglobin 9.9 (L) 12.0 - 15.0 g/dL   HCT 16.1 (L) 09.6 - 04.5 %   MCV 83.2 80.0 - 100.0 fL   MCH 26.8 26.0 - 34.0 pg   MCHC 32.2 30.0 - 36.0 g/dL   RDW 40.9 81.1 - 91.4 %   Platelets 241 150 - 400 K/uL   nRBC 0.0 0.0 - 0.2 %  Comprehensive metabolic panel      Status: Abnormal   Collection Time: 04/16/23 10:27 AM  Result Value Ref  Range   Sodium 135 135 - 145 mmol/L   Potassium 3.6 3.5 - 5.1 mmol/L   Chloride 105 98 - 111 mmol/L   CO2 22 22 - 32 mmol/L   Glucose, Bld 110 (H) 70 - 99 mg/dL   BUN 5 (L) 6 - 20 mg/dL   Creatinine, Ser 1.61 0.44 - 1.00 mg/dL   Calcium 9.1 8.9 - 09.6 mg/dL   Total Protein 6.2 (L) 6.5 - 8.1 g/dL   Albumin 2.7 (L) 3.5 - 5.0 g/dL   AST 13 (L) 15 - 41 U/L   ALT 8 0 - 44 U/L   Alkaline Phosphatase 37 (L) 38 - 126 U/L   Total Bilirubin 0.5 0.0 - 1.2 mg/dL   GFR, Estimated >04 >54 mL/min   Anion gap 8 5 - 15  Urinalysis, Routine w reflex microscopic -Urine, Clean Catch     Status: None   Collection Time: 04/16/23 10:39 AM  Result Value Ref Range   Color, Urine YELLOW YELLOW   APPearance CLEAR CLEAR   Specific Gravity, Urine 1.025 1.005 - 1.030   pH 6.0 5.0 - 8.0   Glucose, UA NEGATIVE NEGATIVE mg/dL   Hgb urine dipstick NEGATIVE NEGATIVE   Bilirubin Urine NEGATIVE NEGATIVE   Ketones, ur NEGATIVE NEGATIVE mg/dL   Protein, ur NEGATIVE NEGATIVE mg/dL   Nitrite NEGATIVE NEGATIVE   Leukocytes,Ua NEGATIVE NEGATIVE  Protein / creatinine ratio, urine     Status: None   Collection Time: 04/16/23 10:52 AM  Result Value Ref Range   Creatinine, Urine 153 mg/dL   Total Protein, Urine 10 mg/dL   Protein Creatinine Ratio 0.07 0.00 - 0.15 mg/mg[Cre]    Imaging:  No results found.  MDM & MAU COURSE  MDM: High  MAU Course: -Preeclampsia labs and BP monitoring. -Excedrin-Tension for headache. Check urine for possible dehydration, if dehydrated start IV fluids. -CBC shows further decreased Hgb, patient has iron supplement at home. -LFTs within normal limits. No proteinuria. -Headache 4/10 at 25 minutes after Excedrin-Tension. Blood pressures have been <140/90. -Ate some graham crackers, headache completely resolved.   Orders Placed This Encounter  Procedures   CBC   Comprehensive metabolic panel   Protein /  creatinine ratio, urine   Urinalysis, Routine w reflex microscopic -Urine, Clean Catch   Discharge patient   Meds ordered this encounter  Medications   DISCONTD: lactated ringers bolus 1,000 mL   acetaminophen-caffeine (EXCEDRIN TENSION HEADACHE) 500-65 MG per tablet 2 tablet    ASSESSMENT   1. Headache in pregnancy, antepartum, third trimester   2. Gestational hypertension, third trimester   3. [redacted] weeks gestation of pregnancy     PLAN  Discharge home in stable condition with return precautions for headache, RUQ/epigastric pain, vision changes.  Emphasized the importance of taking her prescribed iron supplement for IDA and metformin for gDM.  Meets criteria for gestational HTN (BP 131/97 on 04/02/2023 and BP 139/91 today) Follow up tomorrow as scheduled.   Follow-up Information     Adam Phenix, MD Follow up.   Specialty: Obstetrics and Gynecology Why: As scheduled for ongoing prenatal care Contact information: [address] [place] [address] [phone]                 Allergies as of 04/16/2023   No Known Allergies      Medication List     TAKE these medications    acetaminophen 500 MG tablet Commonly known as: TYLENOL Take 500 mg by mouth every  6 (six) hours as needed.   aspirin EC 81 MG tablet Take 1 tablet (81 mg total) by mouth daily. Swallow whole.   FeroSul 325 (65 Fe) MG tablet Generic drug: ferrous sulfate Take 325 mg by mouth every other day.   metFORMIN 500 MG tablet Commonly known as: GLUCOPHAGE Take 1 tablet (500 mg total) by mouth daily with supper.   Prenatal 28-0.8 MG Tabs Take 1 tablet by mouth daily.        Melida Quitter, PA

## 2023-04-16 NOTE — Progress Notes (Signed)
 Patient presents to office today for blood pressure check. Her history is significant for pre-eclampsia with early delivery. She reports frequent headaches but denies dizziness or blurry vision. Has current headache. Trace swelling noted. FHR 142bpm in office- patient reports good fetal movement. She is requesting to be taken out of work for remainder of pregnancy. Recently had flu/strep and having trouble with sciatica pain. She's missed multiple days of work and is concerned about losing her job- per Dr Crissie Reese, okay for patient to be taken out of work especially in light of elevated blood pressures in the office and history. Discussed with patient & letter given. Advised her to have paperwork faxed to our office. Per Dr Roney Mans, patient should go to MAU today for evaluation- discussed with patient. MAU called & notified.  Chase Caller RN BSN 04/16/23

## 2023-04-16 NOTE — Discharge Instructions (Signed)
 It was a pleasure taking care of you today! I am so glad you are feeling better.  You do officially have gestational hypertension, so we will need to continue monitoring your blood pressure closely.  Come back to the MAU if you develop a headache that does not get better with Tylenol, upper abdominal pain, or changes in vision.

## 2023-04-17 ENCOUNTER — Ambulatory Visit: Admitting: *Deleted

## 2023-04-17 ENCOUNTER — Ambulatory Visit (INDEPENDENT_AMBULATORY_CARE_PROVIDER_SITE_OTHER): Payer: Self-pay | Admitting: Obstetrics & Gynecology

## 2023-04-17 ENCOUNTER — Ambulatory Visit: Attending: Obstetrics and Gynecology | Admitting: *Deleted

## 2023-04-17 VITALS — BP 127/85 | HR 90 | Wt 287.0 lb

## 2023-04-17 VITALS — BP 130/75 | HR 97

## 2023-04-17 DIAGNOSIS — Z8759 Personal history of other complications of pregnancy, childbirth and the puerperium: Secondary | ICD-10-CM

## 2023-04-17 DIAGNOSIS — O99213 Obesity complicating pregnancy, third trimester: Secondary | ICD-10-CM | POA: Insufficient documentation

## 2023-04-17 DIAGNOSIS — O24419 Gestational diabetes mellitus in pregnancy, unspecified control: Secondary | ICD-10-CM | POA: Diagnosis not present

## 2023-04-17 DIAGNOSIS — Z3A34 34 weeks gestation of pregnancy: Secondary | ICD-10-CM | POA: Diagnosis not present

## 2023-04-17 DIAGNOSIS — O0993 Supervision of high risk pregnancy, unspecified, third trimester: Secondary | ICD-10-CM | POA: Diagnosis not present

## 2023-04-17 DIAGNOSIS — O24415 Gestational diabetes mellitus in pregnancy, controlled by oral hypoglycemic drugs: Secondary | ICD-10-CM

## 2023-04-17 DIAGNOSIS — O0991 Supervision of high risk pregnancy, unspecified, first trimester: Secondary | ICD-10-CM

## 2023-04-17 DIAGNOSIS — Z98891 History of uterine scar from previous surgery: Secondary | ICD-10-CM

## 2023-04-17 DIAGNOSIS — O133 Gestational [pregnancy-induced] hypertension without significant proteinuria, third trimester: Secondary | ICD-10-CM

## 2023-04-17 DIAGNOSIS — E66813 Obesity, class 3: Secondary | ICD-10-CM | POA: Diagnosis not present

## 2023-04-17 NOTE — Procedures (Signed)
 BRODI NERY 09/05/1990 [redacted]w[redacted]d  Fetus A Non-Stress Test Interpretation for 04/17/23-NST  only  Indication: Gestational Diabetes medication controlled and class 3 obesity  Fetal Heart Rate A Mode: External Baseline Rate (A): 130 bpm Variability: Moderate Accelerations: 15 x 15 Decelerations: None Multiple birth?: No  Uterine Activity Mode: Toco Contraction Frequency (min): none Resting Tone Palpated: Relaxed  Interpretation (Fetal Testing) Nonstress Test Interpretation: Reactive Comments: Tracing reviewed byDr. Parke Poisson

## 2023-04-17 NOTE — Progress Notes (Deleted)
   PRENATAL VISIT NOTE  Subjective:  Denise Gillespie is a 33 y.o. [phone] at [redacted]w[redacted]d being seen today for ongoing prenatal care.  She is currently monitored for the following issues for this {Blank single:19197::"high-risk","low-risk"} pregnancy and has Gestational hypertension, third trimester; Hidradenitis suppurativa; Major depression, recurrent (HCC); Generalized anxiety disorder; Supervision of high risk pregnancy in first trimester; History of severe pre-eclampsia; Maternal morbid obesity, antepartum (HCC); History of VBAC; LGSIL on Pap smear of cervix; BMI 50.0-59.9, adult (HCC); GDM (gestational diabetes mellitus)-Metformin; and Alpha thalassemia silent carrier on their problem list.  Patient reports {sx:[id number]}.  Contractions: Not present. Vag. Bleeding: None.  Movement: Present. Denies leaking of fluid.   The following portions of the patient's history were reviewed and updated as appropriate: allergies, current medications, past family history, past medical history, past social history, past surgical history and problem list.   Objective:   Vitals:   04/17/23 0823  BP: 127/85  Pulse: 90  Weight: 287 lb (130.2 kg)    Fetal Status: Fetal Heart Rate (bpm): 140   Movement: Present     General:  Alert, oriented and cooperative. Patient is in no acute distress.  Skin: Skin is warm and dry. No rash noted.   Cardiovascular: Normal heart rate noted  Respiratory: Normal respiratory effort, no problems with respiration noted  Abdomen: Soft, gravid, appropriate for gestational age.  Pain/Pressure: Absent     Pelvic: {Blank single:19197::"Cervical exam performed in the presence of a chaperone","Cervical exam deferred"}        Extremities: Normal range of motion.  Edema: None  Mental Status: Normal mood and affect. Normal behavior. Normal judgment and thought content.   Assessment and Plan:  Pregnancy: [id number] at [redacted]w[redacted]d 1. [redacted] weeks gestation of pregnancy (Primary) ***  {Blank  single:19197::"Term","Preterm"} labor symptoms and general obstetric precautions including but not limited to vaginal bleeding, contractions, leaking of fluid and fetal movement were reviewed in detail with the patient. Please refer to After Visit Summary for other counseling recommendations.   No follow-ups on file.  Future Appointments  Date Time Provider Department Center  04/17/2023  9:30 AM [facility] NURSE [facility]  04/17/2023  9:45 AM [facility] NST [facility]  04/23/2023  1:15 PM [facility] US2 [facility]  04/29/2023  8:15 AM Adam Phenix, MD [facility]  04/29/2023  8:55 AM [facility] US2 [facility]  05/07/2023  7:00 AM [facility] PROVIDER 1 [facility]  05/07/2023  7:30 AM [facility] US4 [facility]  05/07/2023  8:15 AM Adam Phenix, MD [facility]    Scheryl Darter, MD

## 2023-04-17 NOTE — Progress Notes (Signed)
 Subjective:  Denise Gillespie is a 33 y.o. [phone] at [redacted]w[redacted]d being seen today for prenatal care.  Patient reports no complaints.  Contractions: Not present.  Vag. Bleeding: None. Movement: Present. Denies leaking of fluid.   Was seen at the MAU on 4/3 for hypertension. Bp went down while at the MAU. Is not  currently having any high BP today.   The following portions of the patient's history were reviewed and updated as appropriate: allergies, current medications, past family history, past medical history, past social history, past surgical history and problem list.   Objective:   Vitals:   04/17/23 0823  BP: 127/85  Pulse: 90  Weight: 287 lb (130.2 kg)    Fetal Status: Fetal Heart Rate (bpm): 140   Movement: Present     General:  Alert, oriented and cooperative. Patient is in no acute distress.  Skin: Skin is warm and dry. No rash noted.   Cardiovascular: Normal heart rate noted  Respiratory: Normal respiratory effort, no problems with respiration noted  Abdomen: Soft, gravid, appropriate for gestational age. Pain/Pressure: Absent     Vaginal: Vag. Bleeding: None.       Cervix: Not evaluated        Extremities: Normal range of motion.  Edema: None  Mental Status: Normal mood and affect. Normal behavior. Normal judgment and thought content.   Urinalysis:      Assessment and Plan:  Pregnancy: [name] at [redacted]w[redacted]d  1. [redacted] weeks gestation of pregnancy (Primary) No complaints today. Continue to take metformin and check sugars.   Term labor symptoms and general obstetric precautions including but not limited to vaginal bleeding, contractions, leaking of fluid and fetal movement were reviewed in detail with the patient. Please refer to After Visit Summary for other counseling recommendations.  Return in about 1 week (around 04/24/2023).   Denise Gillespie   Attestation of Attending Supervision of PA Student: Evaluation and management procedures were performed by the  PA student under my supervision and collaboration.  I have reviewed the student's note and chart, and I agree with the management and plan.  Scheryl Darter, MD, FACOG Attending Obstetrician & Gynecologist Faculty Practice, [facility]

## 2023-04-23 ENCOUNTER — Ambulatory Visit

## 2023-04-23 ENCOUNTER — Other Ambulatory Visit: Payer: Self-pay

## 2023-04-23 DIAGNOSIS — Z3A34 34 weeks gestation of pregnancy: Secondary | ICD-10-CM | POA: Diagnosis not present

## 2023-04-23 DIAGNOSIS — O24415 Gestational diabetes mellitus in pregnancy, controlled by oral hypoglycemic drugs: Secondary | ICD-10-CM | POA: Diagnosis not present

## 2023-04-23 DIAGNOSIS — Z6841 Body Mass Index (BMI) 40.0 and over, adult: Secondary | ICD-10-CM

## 2023-04-29 ENCOUNTER — Ambulatory Visit

## 2023-04-29 ENCOUNTER — Ambulatory Visit: Payer: Self-pay | Admitting: Obstetrics & Gynecology

## 2023-04-29 VITALS — BP 127/83 | HR 106 | Wt 300.0 lb

## 2023-04-29 DIAGNOSIS — O99213 Obesity complicating pregnancy, third trimester: Secondary | ICD-10-CM | POA: Diagnosis not present

## 2023-04-29 DIAGNOSIS — O133 Gestational [pregnancy-induced] hypertension without significant proteinuria, third trimester: Secondary | ICD-10-CM

## 2023-04-29 DIAGNOSIS — Z98891 History of uterine scar from previous surgery: Secondary | ICD-10-CM | POA: Diagnosis not present

## 2023-04-29 DIAGNOSIS — O24415 Gestational diabetes mellitus in pregnancy, controlled by oral hypoglycemic drugs: Secondary | ICD-10-CM

## 2023-04-29 DIAGNOSIS — Z3A35 35 weeks gestation of pregnancy: Secondary | ICD-10-CM

## 2023-04-29 DIAGNOSIS — Z0289 Encounter for other administrative examinations: Secondary | ICD-10-CM

## 2023-04-29 DIAGNOSIS — O0993 Supervision of high risk pregnancy, unspecified, third trimester: Secondary | ICD-10-CM

## 2023-04-29 DIAGNOSIS — O0991 Supervision of high risk pregnancy, unspecified, first trimester: Secondary | ICD-10-CM

## 2023-04-29 DIAGNOSIS — Z8759 Personal history of other complications of pregnancy, childbirth and the puerperium: Secondary | ICD-10-CM | POA: Diagnosis not present

## 2023-04-29 DIAGNOSIS — O99210 Obesity complicating pregnancy, unspecified trimester: Secondary | ICD-10-CM

## 2023-04-29 NOTE — Progress Notes (Signed)
   PRENATAL VISIT NOTE  Subjective:  Denise Gillespie is a 33 y.o. [phone] at [redacted]w[redacted]d being seen today for ongoing prenatal care.  She is currently monitored for the following issues for this high-risk pregnancy and has Gestational hypertension, third trimester; Hidradenitis suppurativa; Major depression, recurrent (HCC); Generalized anxiety disorder; Supervision of high risk pregnancy in first trimester; History of severe pre-eclampsia; Maternal morbid obesity, antepartum (HCC); History of VBAC; LGSIL on Pap smear of cervix; BMI 50.0-59.9, adult (HCC); GDM (gestational diabetes mellitus)-Metformin; and Alpha thalassemia silent carrier on their problem list.  Patient reports no complaints.  Contractions: Irritability.  .   . Denies leaking of fluid.   The following portions of the patient's history were reviewed and updated as appropriate: allergies, current medications, past family history, past medical history, past social history, past surgical history and problem list.   Objective:   Vitals:   04/29/23 0819  BP: 127/83  Pulse: (!) 106  Weight: 300 lb (136.1 kg)    Fetal Status: Fetal Heart Rate (bpm): 143         General:  Alert, oriented and cooperative. Patient is in no acute distress.  Skin: Skin is warm and dry. No rash noted.   Cardiovascular: Normal heart rate noted  Respiratory: Normal respiratory effort, no problems with respiration noted  Abdomen: Soft, gravid, appropriate for gestational age.  Pain/Pressure: Present     Pelvic: Cervical exam deferred        Extremities: Normal range of motion.  Edema: Trace  Mental Status: Normal mood and affect. Normal behavior. Normal judgment and thought content.   Assessment and Plan:  Pregnancy: [id number] at [redacted]w[redacted]d 1. Gestational diabetes mellitus (GDM) in third trimester controlled on oral hypoglycemic drug (Primary) All values in range on metformin  2. History of severe pre-eclampsia Normal BP today  3. Gestational  hypertension, third trimester   4. History of VBAC Plans TOLAc  5. Supervision of high risk pregnancy in first trimester Third trimester  6. Maternal morbid obesity, antepartum (HCC)   Preterm labor symptoms and general obstetric precautions including but not limited to vaginal bleeding, contractions, leaking of fluid and fetal movement were reviewed in detail with the patient. Please refer to After Visit Summary for other counseling recommendations.   Return in about 1 week (around 05/06/2023).  Future Appointments  Date Time Provider Department Center  04/29/2023  8:55 AM [facility] US2 [facility]  05/07/2023  7:00 AM [facility] PROVIDER 1 [facility]  05/07/2023  7:30 AM [facility] US4 [facility]  05/07/2023  8:15 AM Tresia Fruit, MD [facility]    Onnie Bilis, MD

## 2023-05-06 ENCOUNTER — Encounter: Payer: Self-pay | Admitting: Obstetrics & Gynecology

## 2023-05-06 ENCOUNTER — Other Ambulatory Visit: Payer: Self-pay

## 2023-05-06 DIAGNOSIS — R4589 Other symptoms and signs involving emotional state: Secondary | ICD-10-CM

## 2023-05-06 DIAGNOSIS — O0991 Supervision of high risk pregnancy, unspecified, first trimester: Secondary | ICD-10-CM

## 2023-05-07 ENCOUNTER — Ambulatory Visit

## 2023-05-07 ENCOUNTER — Other Ambulatory Visit (HOSPITAL_COMMUNITY)
Admission: RE | Admit: 2023-05-07 | Discharge: 2023-05-07 | Disposition: A | Discharge status: Home / Self Care | Source: Ambulatory Visit | Attending: Obstetrics & Gynecology | Admitting: Obstetrics & Gynecology

## 2023-05-07 ENCOUNTER — Ambulatory Visit: Payer: Self-pay | Admitting: Obstetrics & Gynecology

## 2023-05-07 ENCOUNTER — Ambulatory Visit: Admitting: Obstetrics

## 2023-05-07 ENCOUNTER — Other Ambulatory Visit: Payer: Self-pay

## 2023-05-07 VITALS — BP 136/91 | HR 115

## 2023-05-07 DIAGNOSIS — O99213 Obesity complicating pregnancy, third trimester: Secondary | ICD-10-CM | POA: Insufficient documentation

## 2023-05-07 DIAGNOSIS — O0993 Supervision of high risk pregnancy, unspecified, third trimester: Secondary | ICD-10-CM

## 2023-05-07 DIAGNOSIS — Z1332 Encounter for screening for maternal depression: Secondary | ICD-10-CM | POA: Diagnosis not present

## 2023-05-07 DIAGNOSIS — Z3A36 36 weeks gestation of pregnancy: Secondary | ICD-10-CM

## 2023-05-07 DIAGNOSIS — E669 Obesity, unspecified: Secondary | ICD-10-CM

## 2023-05-07 DIAGNOSIS — O0991 Supervision of high risk pregnancy, unspecified, first trimester: Secondary | ICD-10-CM | POA: Insufficient documentation

## 2023-05-07 DIAGNOSIS — O34219 Maternal care for unspecified type scar from previous cesarean delivery: Secondary | ICD-10-CM | POA: Diagnosis not present

## 2023-05-07 DIAGNOSIS — O133 Gestational [pregnancy-induced] hypertension without significant proteinuria, third trimester: Secondary | ICD-10-CM | POA: Diagnosis not present

## 2023-05-07 DIAGNOSIS — O24415 Gestational diabetes mellitus in pregnancy, controlled by oral hypoglycemic drugs: Secondary | ICD-10-CM | POA: Insufficient documentation

## 2023-05-07 NOTE — Progress Notes (Signed)
   PRENATAL VISIT NOTE  Subjective:  Denise Gillespie is a 33 y.o. [phone] at [redacted]w[redacted]d being seen today for ongoing prenatal care.  She is currently monitored for the following issues for this high-risk pregnancy and has Gestational hypertension, third trimester; Hidradenitis suppurativa; Major depression, recurrent (HCC); Generalized anxiety disorder; Supervision of high risk pregnancy in first trimester; History of severe pre-eclampsia; Maternal morbid obesity, antepartum (HCC); History of VBAC; LGSIL on Pap smear of cervix; BMI 50.0-59.9, adult (HCC); GDM (gestational diabetes mellitus)-Metformin ; and Alpha thalassemia silent carrier on their problem list.  Patient reports occasional contractions.  Contractions: Regular.  .  Movement: Present. Denies leaking of fluid.   The following portions of the patient's history were reviewed and updated as appropriate: allergies, current medications, past family history, past medical history, past social history, past surgical history and problem list.   Objective:   Vitals:   05/07/23 0824  BP: (!) 136/91  Pulse: (!) 115    Fetal Status:     Movement: Present  Presentation: Vertex  General:  Alert, oriented and cooperative. Patient is in no acute distress.  Skin: Skin is warm and dry. No rash noted.   Cardiovascular: Normal heart rate noted  Respiratory: Normal respiratory effort, no problems with respiration noted  Abdomen: Soft, gravid, appropriate for gestational age.  Pain/Pressure: Present     Pelvic: Cervical exam performed in the presence of a chaperone Dilation: 1 Effacement (%): 30 Station: -3  Extremities: Normal range of motion.  Edema: Trace  Mental Status: Normal mood and affect. Normal behavior. Normal judgment and thought content.   Assessment and Plan:  Pregnancy: [id number] at [redacted]w[redacted]d 1. Supervision of high risk pregnancy in first trimester (Primary)  - Cervicovaginal ancillary only - Culture, beta strep (group b only) GHTN-  IOL 37 weeks Term labor symptoms and general obstetric precautions including but not limited to vaginal bleeding, contractions, leaking of fluid and fetal movement were reviewed in detail with the patient. Please refer to After Visit Summary for other counseling recommendations.   Return if symptoms worsen or fail to improve.  Future Appointments  Date Time Provider Department Center  05/08/2023  7:15 AM [facility] SCHED ROOM [facility] None  05/14/2023  4:15 PM Tresia Fruit, MD [facility]    Onnie Bilis, MD

## 2023-05-07 NOTE — Addendum Note (Signed)
 Addended by: Tresia Fruit on: 05/07/2023 05:20 PM   Modules accepted: Orders

## 2023-05-08 ENCOUNTER — Inpatient Hospital Stay (HOSPITAL_COMMUNITY)

## 2023-05-08 ENCOUNTER — Encounter (HOSPITAL_COMMUNITY): Payer: Self-pay | Admitting: Obstetrics & Gynecology

## 2023-05-08 ENCOUNTER — Inpatient Hospital Stay (HOSPITAL_COMMUNITY)
Admission: RE | Admit: 2023-05-08 | Discharge: 2023-05-12 | DRG: 806 | Disposition: A | Discharge status: Home / Self Care | Attending: Obstetrics and Gynecology | Admitting: Obstetrics and Gynecology

## 2023-05-08 ENCOUNTER — Other Ambulatory Visit: Payer: Self-pay

## 2023-05-08 DIAGNOSIS — O9081 Anemia of the puerperium: Secondary | ICD-10-CM | POA: Diagnosis not present

## 2023-05-08 DIAGNOSIS — T413X5A Adverse effect of local anesthetics, initial encounter: Secondary | ICD-10-CM | POA: Diagnosis not present

## 2023-05-08 DIAGNOSIS — O1414 Severe pre-eclampsia complicating childbirth: Secondary | ICD-10-CM | POA: Diagnosis not present

## 2023-05-08 DIAGNOSIS — O34211 Maternal care for low transverse scar from previous cesarean delivery: Secondary | ICD-10-CM | POA: Diagnosis not present

## 2023-05-08 DIAGNOSIS — Z3A37 37 weeks gestation of pregnancy: Secondary | ICD-10-CM | POA: Diagnosis not present

## 2023-05-08 DIAGNOSIS — O24425 Gestational diabetes mellitus in childbirth, controlled by oral hypoglycemic drugs: Secondary | ICD-10-CM | POA: Diagnosis present

## 2023-05-08 DIAGNOSIS — Z30017 Encounter for initial prescription of implantable subdermal contraceptive: Secondary | ICD-10-CM | POA: Diagnosis not present

## 2023-05-08 DIAGNOSIS — O139 Gestational [pregnancy-induced] hypertension without significant proteinuria, unspecified trimester: Principal | ICD-10-CM | POA: Diagnosis present

## 2023-05-08 DIAGNOSIS — O9A22 Injury, poisoning and certain other consequences of external causes complicating childbirth: Secondary | ICD-10-CM | POA: Diagnosis not present

## 2023-05-08 DIAGNOSIS — O1092 Unspecified pre-existing hypertension complicating childbirth: Secondary | ICD-10-CM | POA: Diagnosis present

## 2023-05-08 DIAGNOSIS — O114 Pre-existing hypertension with pre-eclampsia, complicating childbirth: Principal | ICD-10-CM | POA: Diagnosis present

## 2023-05-08 DIAGNOSIS — O99214 Obesity complicating childbirth: Secondary | ICD-10-CM | POA: Diagnosis not present

## 2023-05-08 DIAGNOSIS — O24424 Gestational diabetes mellitus in childbirth, insulin controlled: Secondary | ICD-10-CM | POA: Diagnosis not present

## 2023-05-08 DIAGNOSIS — Z148 Genetic carrier of other disease: Secondary | ICD-10-CM

## 2023-05-08 DIAGNOSIS — Z833 Family history of diabetes mellitus: Secondary | ICD-10-CM

## 2023-05-08 DIAGNOSIS — Z87891 Personal history of nicotine dependence: Secondary | ICD-10-CM

## 2023-05-08 DIAGNOSIS — O24419 Gestational diabetes mellitus in pregnancy, unspecified control: Secondary | ICD-10-CM | POA: Diagnosis present

## 2023-05-08 DIAGNOSIS — I952 Hypotension due to drugs: Secondary | ICD-10-CM | POA: Diagnosis not present

## 2023-05-08 DIAGNOSIS — O1410 Severe pre-eclampsia, unspecified trimester: Secondary | ICD-10-CM | POA: Diagnosis present

## 2023-05-08 DIAGNOSIS — O133 Gestational [pregnancy-induced] hypertension without significant proteinuria, third trimester: Secondary | ICD-10-CM

## 2023-05-08 DIAGNOSIS — O34219 Maternal care for unspecified type scar from previous cesarean delivery: Principal | ICD-10-CM | POA: Insufficient documentation

## 2023-05-08 DIAGNOSIS — O134 Gestational [pregnancy-induced] hypertension without significant proteinuria, complicating childbirth: Secondary | ICD-10-CM | POA: Diagnosis not present

## 2023-05-08 DIAGNOSIS — D62 Acute posthemorrhagic anemia: Secondary | ICD-10-CM | POA: Diagnosis not present

## 2023-05-08 DIAGNOSIS — Z8249 Family history of ischemic heart disease and other diseases of the circulatory system: Secondary | ICD-10-CM

## 2023-05-08 DIAGNOSIS — Z3A00 Weeks of gestation of pregnancy not specified: Secondary | ICD-10-CM | POA: Diagnosis not present

## 2023-05-08 LAB — COMPREHENSIVE METABOLIC PANEL WITH GFR
ALT: 12 U/L (ref 0–44)
AST: 24 U/L (ref 15–41)
Albumin: 2.8 g/dL — ABNORMAL LOW (ref 3.5–5.0)
Alkaline Phosphatase: 46 U/L (ref 38–126)
Anion gap: 9 (ref 5–15)
BUN: 11 mg/dL (ref 6–20)
CO2: 20 mmol/L — ABNORMAL LOW (ref 22–32)
Calcium: 9.1 mg/dL (ref 8.9–10.3)
Chloride: 109 mmol/L (ref 98–111)
Creatinine, Ser: 0.53 mg/dL (ref 0.44–1.00)
GFR, Estimated: >60 mL/min (ref >60)
Glucose, Bld: 106 mg/dL — ABNORMAL HIGH (ref 70–99)
Potassium: 3.7 mmol/L (ref 3.5–5.1)
Sodium: 138 mmol/L (ref 135–145)
Total Bilirubin: 0.7 mg/dL (ref 0.0–1.2)
Total Protein: 6.2 g/dL — ABNORMAL LOW (ref 6.5–8.1)

## 2023-05-08 LAB — CERVICOVAGINAL ANCILLARY ONLY
Chlamydia: NEGATIVE
Comment: NEGATIVE
Comment: NORMAL
Neisseria Gonorrhea: NEGATIVE

## 2023-05-08 LAB — CBC
HCT: 32.9 % — ABNORMAL LOW (ref 36.0–46.0)
Hemoglobin: 10.5 g/dL — ABNORMAL LOW (ref 12.0–15.0)
MCH: 26.5 pg (ref 26.0–34.0)
MCHC: 31.9 g/dL (ref 30.0–36.0)
MCV: 83.1 fL (ref 80.0–100.0)
Platelets: 252 10*3/uL (ref 150–400)
RBC: 3.96 MIL/uL (ref 3.87–5.11)
RDW: 14.6 % (ref 11.5–15.5)
WBC: 7.4 10*3/uL (ref 4.0–10.5)
nRBC: 0 % (ref 0.0–0.2)

## 2023-05-08 LAB — TYPE AND SCREEN
ABO/RH(D): A POS
Antibody Screen: NEGATIVE

## 2023-05-08 LAB — PROTEIN / CREATININE RATIO, URINE
Creatinine, Urine: 294 mg/dL
Protein Creatinine Ratio: 0.09 mg/mg{creat} (ref 0.00–0.15)
Total Protein, Urine: 27 mg/dL

## 2023-05-08 LAB — GROUP B STREP BY PCR: Group B strep by PCR: NEGATIVE

## 2023-05-08 LAB — GLUCOSE, CAPILLARY: Glucose-Capillary: 96 mg/dL (ref 70–99)

## 2023-05-08 MED ORDER — OXYCODONE-ACETAMINOPHEN 5-325 MG PO TABS: 2.0000 | ORAL_TABLET | ORAL | Status: DC | PRN

## 2023-05-08 MED ORDER — LABETALOL HCL 5 MG/ML IV SOLN
40.0000 mg | INTRAVENOUS | Status: DC | PRN
  Filled 2023-05-08: qty 8

## 2023-05-08 MED ORDER — LACTATED RINGERS IV SOLN: INTRAVENOUS | Status: AC

## 2023-05-08 MED ORDER — LIDOCAINE HCL (PF) 1 % IJ SOLN: 30.0000 mL | INTRAMUSCULAR | Status: DC | PRN

## 2023-05-08 MED ORDER — SOD CITRATE-CITRIC ACID 500-334 MG/5ML PO SOLN: 30.0000 mL | ORAL | Status: DC | PRN

## 2023-05-08 MED ORDER — MAGNESIUM SULFATE 40 GM/1000ML IV SOLN
2.0000 g/h | INTRAVENOUS | Status: DC
  Administered 2023-05-08: 2 g/h via INTRAVENOUS
  Filled 2023-05-08 (×2): qty 1000

## 2023-05-08 MED ORDER — TERBUTALINE SULFATE 1 MG/ML IJ SOLN
0.2500 mg | Freq: Once | INTRAMUSCULAR | Status: AC | PRN
  Administered 2023-05-10: 0.25 mg via SUBCUTANEOUS
  Filled 2023-05-08: qty 1

## 2023-05-08 MED ORDER — MAGNESIUM SULFATE BOLUS VIA INFUSION
4.0000 g | Freq: Once | INTRAVENOUS | Status: AC
  Administered 2023-05-08: 4 g via INTRAVENOUS
  Filled 2023-05-08: qty 1000

## 2023-05-08 MED ORDER — HYDRALAZINE HCL 20 MG/ML IJ SOLN: 10.0000 mg | INTRAMUSCULAR | Status: DC | PRN

## 2023-05-08 MED ORDER — OXYTOCIN-SODIUM CHLORIDE 30-0.9 UT/500ML-% IV SOLN
1.0000 m[IU]/min | INTRAVENOUS | Status: DC
  Administered 2023-05-08 – 2023-05-10 (×2): 2 m[IU]/min via INTRAVENOUS
  Filled 2023-05-08: qty 500

## 2023-05-08 MED ORDER — OXYCODONE-ACETAMINOPHEN 5-325 MG PO TABS: 1.0000 | ORAL_TABLET | ORAL | Status: DC | PRN

## 2023-05-08 MED ORDER — FENTANYL CITRATE (PF) 100 MCG/2ML IJ SOLN
50.0000 ug | INTRAMUSCULAR | Status: DC | PRN
  Administered 2023-05-08: 50 ug via INTRAVENOUS
  Filled 2023-05-08: qty 2

## 2023-05-08 MED ORDER — ACETAMINOPHEN 325 MG PO TABS
650.0000 mg | ORAL_TABLET | ORAL | Status: DC | PRN
  Administered 2023-05-09: 650 mg via ORAL
  Filled 2023-05-08: qty 2

## 2023-05-08 MED ORDER — LABETALOL HCL 5 MG/ML IV SOLN: 80.0000 mg | INTRAVENOUS | Status: DC | PRN

## 2023-05-08 MED ORDER — OXYTOCIN-SODIUM CHLORIDE 30-0.9 UT/500ML-% IV SOLN
2.5000 [IU]/h | INTRAVENOUS | Status: DC
  Filled 2023-05-08: qty 500

## 2023-05-08 MED ORDER — OXYTOCIN BOLUS FROM INFUSION
333.0000 mL | Freq: Once | INTRAVENOUS | Status: AC
  Administered 2023-05-10: 333 mL via INTRAVENOUS

## 2023-05-08 MED ORDER — LACTATED RINGERS IV SOLN: 500.0000 mL | INTRAVENOUS | Status: AC | PRN

## 2023-05-08 MED ORDER — ONDANSETRON HCL 4 MG/2ML IJ SOLN
4.0000 mg | Freq: Four times a day (QID) | INTRAMUSCULAR | Status: DC | PRN
  Administered 2023-05-09 – 2023-05-10 (×2): 4 mg via INTRAVENOUS
  Filled 2023-05-08 (×2): qty 2

## 2023-05-08 MED ORDER — LABETALOL HCL 5 MG/ML IV SOLN
20.0000 mg | INTRAVENOUS | Status: DC | PRN
  Administered 2023-05-08: 20 mg via INTRAVENOUS
  Filled 2023-05-08: qty 4

## 2023-05-08 NOTE — Progress Notes (Signed)
 Brief labor note:  Discussed course of IOL with patient -- in past she has had IOL and had Pit and FB. She is amenable to foley baloon placement. Cat I strip. Pt verbally consented to placement of FB. Cervix 1.5/50/-3. Difficult placement d/t posterior cervix and body habitus. When balloon was inflated, balloon had fallen out from cervix. Cervix rechecked and was 3/50/-3. Discussed starting Pitocin , pt amenable. Will start 2x2.  Had one severe range BP, repeat wnl. Discussed potential need for mag if BP back in severe range. No si/sx severe pre-e at this time. Pt has known hx severe pre-e, familiar w mag.  CBGs every 4 hours while in latent labor, will change to q2 hours when transitions to active labor.  No BTL consent on file. Discussed w pt she may be a better candidate for interval BTL and she is understanding. Plan for postpartum reproductive life planning is for Nexplanon prior to d/c as bridge to BTL.  Melanie Spires, MD OB Fellow, Faculty Practice [facility], Center for [facility]

## 2023-05-08 NOTE — H&P (Signed)
 OBSTETRIC ADMISSION HISTORY AND PHYSICAL  Anyah Shameeka Silliman is a 33 y.o. female [phone] with IUP at [redacted]w[redacted]d by 8 week US  presenting for IOL & TOLAC (consent 03/06/2023) with GHTN & GDM. She reports +FMs, No LOF, no VB, no blurry vision, headaches or peripheral edema, and RUQ pain.  She plans on breast and formula feeding. She request BTS for birth control. She received her prenatal care at [facility]   Dating: By 8 week US  --->  Estimated Date of Delivery: 05/29/23  Sono:   @[redacted]w[redacted]d , CWD, normal female anatomy, cephalic presentation, anterior placental lie, 2945 gm 6 lb 8 oz 44 % EFW  Prenatal History/Complications:  - BMI 51 - Hx of CS & 2x VBAC; CS for breech and preeclampsia then VBACs - proven to 3572g - GHTN  - A2GDM on Metformin  - Hidrandenitis suppurativa - MDD & GAD no medications - LSIL on pap smear - Alapha thalassemia silent carrier  Past Medical History: Past Medical History:  Diagnosis Date   Abscess    multiple   Anemia, iron  deficiency    Depression    Fibroid    Gestational diabetes    Hidradenitis    History of gestational diabetes 03/27/2019   Dx on 3/12     Obesity    Prediabetes 01/02/2023   Pregnancy induced hypertension     Past Surgical History: Past Surgical History:  Procedure Laterality Date   CESAREAN SECTION     INCISE AND DRAIN ABCESS     THERAPEUTIC ABORTION      Obstetrical History: OB History     Gravida  5   Para  3   Term  2   Preterm  1   AB  1   Living  3      SAB  0   IAB  1   Ectopic  0   Multiple  0   Live Births  3           Social History Social History   Socioeconomic History   Marital status: Single    Spouse name: Not on file   Number of children: Not on file   Years of education: Not on file   Highest education level: Not on file  Occupational History   Not on file  Tobacco Use   Smoking status: Former    Current packs/day: 0.00    Types: Cigarettes    Quit date: 09/18/2018    Years since  quitting: 4.6   Smokeless tobacco: Never   Tobacco comments:    quit when found out pregnant   Vaping Use   Vaping status: Former   Quit date: 01/13/2020   Substances: Nicotine, Flavoring  Substance and Sexual Activity   Alcohol use: Not Currently    Alcohol/week: 5.0 standard drinks of alcohol    Types: 5 Glasses of wine per week    Comment: -   Drug use: Not Currently    Types: Marijuana    Comment: last use 15 yrs ago   Sexual activity: Not Currently  Other Topics Concern   Not on file  Social History Narrative   Not on file   Social Drivers of Health   Financial Resource Strain: Not on file  Food Insecurity: No Food Insecurity (05/07/2023)   Hunger Vital Sign    Worried About Running Out of Food in the Last Year: Never true    Ran Out of Food in the Last Year: Never true  Transportation Needs: No Transportation Needs (  05/07/2023)   PRAPARE - Administrator, Civil Service (Medical): No    Lack of Transportation (Non-Medical): No  Physical Activity: Not on file  Stress: Not on file  Social Connections: Unknown (05/27/2021)   Received from [facility]   Social Network    Social Network: Not on file    Family History: Family History  Problem Relation Age of Onset   Diabetes Mother    Arthritis Mother    Hypertension Mother    Migraines Mother    Depression Mother    Liver disease Mother        fatty liver   Lumbar disc disease Mother        degenerative   Eczema Brother    Heart disease Paternal Aunt    Diabetes Paternal Aunt    Heart disease Maternal Grandmother    Stroke Maternal Grandmother    Asthma Neg Hx    Cancer Neg Hx     Allergies: No Known Allergies  Medications Prior to Admission  Medication Sig Dispense Refill Last Dose/Taking   acetaminophen  (TYLENOL ) 500 MG tablet Take 500 mg by mouth every 6 (six) hours as needed. (Patient not taking: Reported on 05/07/2023)      aspirin  EC 81 MG tablet Take 1 tablet (81 mg  total) by mouth daily. Swallow whole. 30 tablet 12    FEROSUL 325 (65 Fe) MG tablet Take 325 mg by mouth every other day.      metFORMIN  (GLUCOPHAGE ) 500 MG tablet Take 1 tablet (500 mg total) by mouth daily with supper. 60 tablet 5    Prenatal 28-0.8 MG TABS Take 1 tablet by mouth daily. 30 tablet 12      Review of Systems   All systems reviewed and negative except as stated in HPI  Last menstrual period 08/22/2022. General appearance: alert, cooperative, and appears stated age Lungs: clear to auscultation bilaterally Heart: regular rate and rhythm Abdomen: soft, non-tender; bowel sounds normal Pelvic: adequate Extremities: Homans sign is negative, no sign of DVT DTR's 2+ Presentation: cephalic Fetal monitoringBaseline: 150 bpm, Variability: Good {> 6 bpm), Accelerations: Reactive, and Decelerations: Absent Uterine activity: none     Prenatal labs: ABO, Rh: A/Positive/-- (10/28 1512) Antibody: Negative (10/28 1512) Rubella: 1.41 (10/28 1512) RPR: Non Reactive (02/21 1008)  HBsAg: Negative (10/28 1512)  HIV: Non Reactive (02/21 1008)  GBS:      Lab Results  Component Value Date   GBS Negative 04/21/2019   GTT abnormal Genetic screening  Silent carrier alpha thalassemia Anatomy US  normal female  Immunization History  Administered Date(s) Administered   Hepatitis B 03/21/2009   Influenza Whole 03/28/2008, 05/09/2009   PFIZER Comirnaty(Gray Top)Covid-19 Tri-Sucrose Vaccine 05/25/2020, 07/06/2020   PPD Test 04/21/2012   Td 03/21/2009   Tdap 03/24/2019    Prenatal Transfer Tool  Maternal Diabetes: Yes:  Diabetes Type:  Insulin /Medication controlled Genetic Screening: Abnormal:  Results: Other: Silent alpha thalassemia carrier Maternal Ultrasounds/Referrals: Normal Fetal Ultrasounds or other Referrals:  None Maternal Substance Abuse:  No Significant Maternal Medications:  Meds include: Other: Metformin  Significant Maternal Lab Results: GBS unknown, PCR  pending Number of Prenatal Visits:greater than 3 verified prenatal visits Maternal Vaccinations:Covid Other Comments:  None   No results found for this or any previous visit (from the past 24 hours).  Patient Active Problem List   Diagnosis Date Noted   Gestational hypertension 05/08/2023   Alpha thalassemia silent carrier 12/29/2022   GDM (gestational diabetes mellitus)-Metformin  11/05/2022  BMI 50.0-59.9, adult (HCC) 12/28/2018   LGSIL on Pap smear of cervix 12/03/2018   History of VBAC 11/30/2018   Supervision of high risk pregnancy in first trimester 11/16/2018   History of severe pre-eclampsia 11/16/2018   Maternal morbid obesity, antepartum (HCC)    Hidradenitis suppurativa 05/25/2017   Major depression, recurrent (HCC) 05/25/2017   Generalized anxiety disorder 05/25/2017   Gestational hypertension, third trimester 04/13/2007    Assessment/Plan:  Jaiyana Michael Ventresca is a 33 y.o. [id number] at [redacted]w[redacted]d here for IOL & TOLAC for GHTN and A2GDM  #Labor:Foley balloon + low dose pitocin  > AROM and titration of pitocin  as indicated by SVE #Pain: Per pt request #FWB: Cat I #GBS status:  Unknown, PCR pending if positive will treat with PCN #Feeding: Breastmilk  and Formula #Reproductive Life planning: Tubal Ligation - Interval #Circ:  yes  #GHTN - CBC, CMP, and Urine pro/cr  #A2GDM - Continue metformin  - q4hr CBG in latent labor   Darrow End, MD  05/08/2023, 6:53 PM

## 2023-05-09 ENCOUNTER — Inpatient Hospital Stay (HOSPITAL_COMMUNITY): Admitting: Anesthesiology

## 2023-05-09 LAB — GLUCOSE, CAPILLARY
Glucose-Capillary: 106 mg/dL — ABNORMAL HIGH (ref 70–99)
Glucose-Capillary: 114 mg/dL — ABNORMAL HIGH (ref 70–99)
Glucose-Capillary: 88 mg/dL (ref 70–99)
Glucose-Capillary: 93 mg/dL (ref 70–99)
Glucose-Capillary: 95 mg/dL (ref 70–99)
Glucose-Capillary: 96 mg/dL (ref 70–99)

## 2023-05-09 LAB — CBC
HCT: 31 % — ABNORMAL LOW (ref 36.0–46.0)
Hemoglobin: 10.1 g/dL — ABNORMAL LOW (ref 12.0–15.0)
MCH: 27.4 pg (ref 26.0–34.0)
MCHC: 32.6 g/dL (ref 30.0–36.0)
MCV: 84.2 fL (ref 80.0–100.0)
Platelets: 216 10*3/uL (ref 150–400)
RBC: 3.68 MIL/uL — ABNORMAL LOW (ref 3.87–5.11)
RDW: 14.9 % (ref 11.5–15.5)
WBC: 8.1 10*3/uL (ref 4.0–10.5)
nRBC: 0 % (ref 0.0–0.2)

## 2023-05-09 LAB — RPR: RPR Ser Ql: NONREACTIVE

## 2023-05-09 MED ORDER — LACTATED RINGERS IV SOLN
500.0000 mL | INTRAVENOUS | Status: DC | PRN
  Administered 2023-05-10: 1000 mL via INTRAVENOUS

## 2023-05-09 MED ORDER — LACTATED RINGERS IV SOLN
500.0000 mL | Freq: Once | INTRAVENOUS | Status: AC
  Administered 2023-05-09: 500 mL via INTRAVENOUS

## 2023-05-09 MED ORDER — LACTATED RINGERS IV SOLN: INTRAVENOUS | Status: DC

## 2023-05-09 MED ORDER — EPHEDRINE 5 MG/ML INJ
10.0000 mg | INTRAVENOUS | Status: DC | PRN
  Administered 2023-05-10: 10 mg via INTRAVENOUS
  Filled 2023-05-09 (×2): qty 5

## 2023-05-09 MED ORDER — LIDOCAINE HCL (PF) 1 % IJ SOLN
INTRAMUSCULAR | Status: DC | PRN
Start: 2023-05-09 — End: 2023-05-10
  Administered 2023-05-09 (×2): 4 mL via EPIDURAL

## 2023-05-09 MED ORDER — PHENYLEPHRINE 80 MCG/ML (10ML) SYRINGE FOR IV PUSH (FOR BLOOD PRESSURE SUPPORT)
80.0000 ug | PREFILLED_SYRINGE | INTRAVENOUS | Status: AC | PRN
  Administered 2023-05-10 (×2): 80 ug via INTRAVENOUS
  Filled 2023-05-09: qty 10

## 2023-05-09 MED ORDER — PHENYLEPHRINE 80 MCG/ML (10ML) SYRINGE FOR IV PUSH (FOR BLOOD PRESSURE SUPPORT)
80.0000 ug | PREFILLED_SYRINGE | INTRAVENOUS | Status: AC | PRN
  Administered 2023-05-10 (×3): 80 ug via INTRAVENOUS
  Filled 2023-05-09 (×2): qty 10

## 2023-05-09 MED ORDER — EPHEDRINE 5 MG/ML INJ
10.0000 mg | INTRAVENOUS | Status: DC | PRN
  Administered 2023-05-10: 10 mg via INTRAVENOUS
  Filled 2023-05-09: qty 5

## 2023-05-09 MED ORDER — FENTANYL-BUPIVACAINE-NACL 0.5-0.125-0.9 MG/250ML-% EP SOLN
12.0000 mL/h | EPIDURAL | Status: DC | PRN
  Administered 2023-05-09: 11 mL/h via EPIDURAL
  Administered 2023-05-10: 12 mL/h via EPIDURAL
  Filled 2023-05-09 (×2): qty 250

## 2023-05-09 MED ORDER — DIPHENHYDRAMINE HCL 50 MG/ML IJ SOLN
12.5000 mg | INTRAMUSCULAR | Status: DC | PRN
  Administered 2023-05-10: 12.5 mg via INTRAVENOUS
  Filled 2023-05-09: qty 1

## 2023-05-09 NOTE — Anesthesia Procedure Notes (Signed)
 Epidural Patient location during procedure: OB Start time: 05/09/2023 1:32 PM End time: 05/09/2023 1:41 PM  Staffing Anesthesiologist: Tura Gaines, MD Performed: anesthesiologist   Preanesthetic Checklist Completed: patient identified, IV checked, site marked, risks and benefits discussed, surgical consent, monitors and equipment checked, pre-op evaluation and timeout performed  Epidural Patient position: sitting Prep: DuraPrep and site prepped and draped Patient monitoring: continuous pulse ox and blood pressure Approach: midline Location: L3-L4 Injection technique: LOR air  Needle:  Needle type: Tuohy  Needle gauge: 17 G Needle length: 9 cm and 9 Needle insertion depth: 8 cm Catheter type: closed end flexible Catheter size: 19 Gauge Catheter at skin depth: 15 cm Test dose: negative and Other  Assessment Events: blood not aspirated, no cerebrospinal fluid, injection not painful, no injection resistance, no paresthesia and negative IV test  Additional Notes Patient identified. Risks and benefits discussed including failed block, incomplete  Pain control, post dural puncture headache, nerve damage, paralysis, blood pressure Changes, nausea, vomiting, reactions to medications-both toxic and allergic and post Partum back pain. All questions were answered. Patient expressed understanding and wished to proceed. Sterile technique was used throughout procedure. Epidural site was Dressed with sterile barrier dressing. No paresthesias, signs of intravascular injection Or signs of intrathecal spread were encountered.  Patient was more comfortable after the epidural was dosed. Please see RN's note for documentation of vital signs and FHR which are stable. Reason for block:procedure for pain

## 2023-05-09 NOTE — Progress Notes (Signed)
 Labor Progress Note Jasiel Cinda Witty is a 33 y.o. [phone] at [redacted]w[redacted]d presented for IOL for gHTN, now w severe pre-e and known A2GDM.  S: Feeling more pressure w ctx.  O:  BP 124/63   Pulse 100   Temp 98.2 F (36.8 C) (Oral)   Resp 16   Ht 5' (1.524 m)   Wt 135.9 kg   LMP 08/22/2022   SpO2 98%   BMI 58.49 kg/m  EFM: 135/mod/+a/+early,variables  CVE: Dilation: 5 Effacement (%): 70 Cervical Position: Posterior Station: -3 Presentation: Vertex Exam by:: Dr Hubert Madden   A&P: 33 y.o. [id number] [redacted]w[redacted]d here for IOL, now w PEC w SF #Labor: Progressing well. Discussed labor progress w pt, on 32 of Pitocin , ctx still spaced out, but more regular. Continue Pit, anticipate continued cervical change. #Pain: Epidural #FWB: Cat II, but reassuring #GBS  PCR presumptive negative  #PEC w SF: On Mag, BP have been normotensive to mild range  #A2GDM: CBG have been ok   Melanie Spires, MD 10:33 PM

## 2023-05-09 NOTE — Progress Notes (Signed)
 Labor Progress Note Denise Gillespie is a 33 y.o. [phone] at [redacted]w[redacted]d presented for IOL & TOLAC for GHTN and GDM S: Frustrated with slow progression and frequent repositioning. Discussed roles, risks, benefits of IUPC; pt agreeable.   O:  BP 125/66   Pulse 100   Temp 98.2 F (36.8 C) (Oral)   Resp 16   Ht 5' (1.524 m)   Wt 135.9 kg   LMP 08/22/2022   SpO2 98%   BMI 58.49 kg/m   EFM: baseline 145, accels, rare variable decels, moderate variability TOCO: q2-36min contractions  CVE: Dilation: 3 Effacement (%): Thick Cervical Position: Posterior Station: -2 Presentation: Vertex Exam by:: Dr. Cooper Denver   A&P: 33 y.o. [id number] [redacted]w[redacted]d admitted for IOL #Labor: Progressing slowly. IUPC placed to facilitate Pitocin  titration. #Pain: Epidural #FWB: Cat II; overall reassuring #GBS negative  #GHTN - mild range BP, labs wnl    #GDM on Metformin  CBG (last 3)  Recent Labs    05/09/23 0357 05/09/23 0820 05/09/23 1423  GLUCAP 96 88 95     Darrow End, MD 5:57 PM

## 2023-05-09 NOTE — Progress Notes (Signed)
 Brief labor note:  At bedside to recheck pt's cervix, she is feeling the contractions, but some of them are not picking up on toco. Cat I strip. Cervix still very posterior, fetal head ballotable, and exam unchanged. Will cont to uptitrate Pitocin .   Had severe BP x 2 around 2230, started on Mag. Required Labetalol  x 1 dose. Cont Mag and cycling BP.   CBGs ok, will cont to monitor.  Melanie Spires, MD OB Fellow, Faculty Practice [facility], Center for [facility]

## 2023-05-09 NOTE — Anesthesia Preprocedure Evaluation (Signed)
 Anesthesia Evaluation  Patient identified by MRN, date of birth, ID band Patient awake    Reviewed: Allergy & Precautions, Patient's Chart, lab work & pertinent test results  History of Anesthesia Complications Negative for: history of anesthetic complications  Airway Mallampati: II  TM Distance: >3 FB Neck ROM: Full    Dental no notable dental hx. (+) Teeth Intact   Pulmonary former smoker   Pulmonary exam normal        Cardiovascular hypertension, Normal cardiovascular exam     Neuro/Psych  PSYCHIATRIC DISORDERS Anxiety Depression    negative neurological ROS     GI/Hepatic negative GI ROS, Neg liver ROS,,,  Endo/Other  diabetes, Well Controlled, Gestational  Class 4 obesity  Renal/GU negative Renal ROS  negative genitourinary   Musculoskeletal negative musculoskeletal ROS (+)    Abdominal  (+) + obese  Peds  Hematology  (+) Blood dyscrasia, anemia Hgb 11.1, plt 307   Anesthesia Other Findings   Reproductive/Obstetrics (+) Pregnancy (preE on Mg)                              Anesthesia Physical Anesthesia Plan  ASA: 3  Anesthesia Plan: Epidural   Post-op Pain Management: Minimal or no pain anticipated   Induction: Intravenous  PONV Risk Score and Plan: Treatment may vary due to age or medical condition  Airway Management Planned: Natural Airway  Additional Equipment: Fetal Monitoring and None  Intra-op Plan:   Post-operative Plan:   Informed Consent: I have reviewed the patients History and Physical, chart, labs and discussed the procedure including the risks, benefits and alternatives for the proposed anesthesia with the patient or authorized representative who has indicated his/her understanding and acceptance.       Plan Discussed with: Anesthesiologist  Anesthesia Plan Comments:          Anesthesia Quick Evaluation

## 2023-05-09 NOTE — Progress Notes (Signed)
 Labor Progress Note Denise Gillespie is a 33 y.o. [phone] at [redacted]w[redacted]d presented for IOL & TOLAC for GHTN and GDM S: Comfortable with epidural. Discussed role, risks, benefits of AROM; pt agreeable.   O:  BP (!) 150/88   Pulse 88   Temp 97.9 F (36.6 C) (Oral)   Resp 16   Ht 5' (1.524 m)   Wt 135.9 kg   LMP 08/22/2022   SpO2 98%   BMI 58.49 kg/m   EFM: baseline 145, accels, no decels, moderate variability TOCO: no contractions  CVE: Dilation: 3 Effacement (%): Thick Cervical Position: Posterior Station: -2 Presentation: Vertex Exam by:: Dr. Cooper Denver   A&P: 33 y.o. [id number] [redacted]w[redacted]d admitted for IOL #Labor: Progressing well. AROM with FSE, clear copious. #Pain: Epidural #FWB: Cat I #GBS negative  #GHTN - mild range BP, labs wnl   #GDM on Metformin  CBG (last 3)  Recent Labs    05/09/23 0357 05/09/23 0820 05/09/23 1423  GLUCAP 96 88 95     Darrow End, MD 3:54 PM

## 2023-05-10 ENCOUNTER — Encounter (HOSPITAL_COMMUNITY): Payer: Self-pay | Admitting: Obstetrics & Gynecology

## 2023-05-10 DIAGNOSIS — O1414 Severe pre-eclampsia complicating childbirth: Secondary | ICD-10-CM | POA: Diagnosis not present

## 2023-05-10 DIAGNOSIS — O24424 Gestational diabetes mellitus in childbirth, insulin controlled: Secondary | ICD-10-CM | POA: Diagnosis not present

## 2023-05-10 DIAGNOSIS — Z3A37 37 weeks gestation of pregnancy: Secondary | ICD-10-CM

## 2023-05-10 DIAGNOSIS — O34219 Maternal care for unspecified type scar from previous cesarean delivery: Principal | ICD-10-CM | POA: Insufficient documentation

## 2023-05-10 DIAGNOSIS — O34211 Maternal care for low transverse scar from previous cesarean delivery: Secondary | ICD-10-CM | POA: Diagnosis not present

## 2023-05-10 LAB — CBC
HCT: 32.6 % — ABNORMAL LOW (ref 36.0–46.0)
HCT: 33.2 % — ABNORMAL LOW (ref 36.0–46.0)
HCT: 31.3 % — ABNORMAL LOW (ref 36.0–46.0)
Hemoglobin: 10.4 g/dL — ABNORMAL LOW (ref 12.0–15.0)
Hemoglobin: 10.9 g/dL — ABNORMAL LOW (ref 12.0–15.0)
Hemoglobin: 10.2 g/dL — ABNORMAL LOW (ref 12.0–15.0)
MCH: 26.9 pg (ref 26.0–34.0)
MCH: 27.5 pg (ref 26.0–34.0)
MCH: 27.3 pg (ref 26.0–34.0)
MCHC: 31.9 g/dL (ref 30.0–36.0)
MCHC: 32.8 g/dL (ref 30.0–36.0)
MCHC: 32.6 g/dL (ref 30.0–36.0)
MCV: 84.5 fL (ref 80.0–100.0)
MCV: 83.8 fL (ref 80.0–100.0)
MCV: 83.9 fL (ref 80.0–100.0)
Platelets: 229 10*3/uL (ref 150–400)
Platelets: 245 10*3/uL (ref 150–400)
Platelets: 261 10*3/uL (ref 150–400)
RBC: 3.86 MIL/uL — ABNORMAL LOW (ref 3.87–5.11)
RBC: 3.96 MIL/uL (ref 3.87–5.11)
RBC: 3.73 MIL/uL — ABNORMAL LOW (ref 3.87–5.11)
RDW: 14.9 % (ref 11.5–15.5)
RDW: 14.7 % (ref 11.5–15.5)
RDW: 14.8 % (ref 11.5–15.5)
WBC: 12.2 10*3/uL — ABNORMAL HIGH (ref 4.0–10.5)
WBC: 15.5 10*3/uL — ABNORMAL HIGH (ref 4.0–10.5)
WBC: 20.4 10*3/uL — ABNORMAL HIGH (ref 4.0–10.5)
nRBC: 0 % (ref 0.0–0.2)
nRBC: 0 % (ref 0.0–0.2)
nRBC: 0 % (ref 0.0–0.2)

## 2023-05-10 LAB — COMPREHENSIVE METABOLIC PANEL WITH GFR
ALT: 14 U/L (ref 0–44)
ALT: 13 U/L (ref 0–44)
AST: 30 U/L (ref 15–41)
AST: 28 U/L (ref 15–41)
Albumin: 2.6 g/dL — ABNORMAL LOW (ref 3.5–5.0)
Albumin: 2.5 g/dL — ABNORMAL LOW (ref 3.5–5.0)
Alkaline Phosphatase: 51 U/L (ref 38–126)
Alkaline Phosphatase: 50 U/L (ref 38–126)
Anion gap: 10 (ref 5–15)
Anion gap: 9 (ref 5–15)
BUN: <5 mg/dL — ABNORMAL LOW (ref 6–20)
BUN: <5 mg/dL — ABNORMAL LOW (ref 6–20)
CO2: 19 mmol/L — ABNORMAL LOW (ref 22–32)
CO2: 21 mmol/L — ABNORMAL LOW (ref 22–32)
Calcium: 7.6 mg/dL — ABNORMAL LOW (ref 8.9–10.3)
Calcium: 7.7 mg/dL — ABNORMAL LOW (ref 8.9–10.3)
Chloride: 103 mmol/L (ref 98–111)
Chloride: 103 mmol/L (ref 98–111)
Creatinine, Ser: 0.77 mg/dL (ref 0.44–1.00)
Creatinine, Ser: 0.81 mg/dL (ref 0.44–1.00)
GFR, Estimated: >60 mL/min (ref >60)
GFR, Estimated: >60 mL/min (ref >60)
Glucose, Bld: 136 mg/dL — ABNORMAL HIGH (ref 70–99)
Glucose, Bld: 125 mg/dL — ABNORMAL HIGH (ref 70–99)
Potassium: 3.8 mmol/L (ref 3.5–5.1)
Potassium: 3.9 mmol/L (ref 3.5–5.1)
Sodium: 132 mmol/L — ABNORMAL LOW (ref 135–145)
Sodium: 133 mmol/L — ABNORMAL LOW (ref 135–145)
Total Bilirubin: 0.7 mg/dL (ref 0.0–1.2)
Total Bilirubin: 0.6 mg/dL (ref 0.0–1.2)
Total Protein: 6.2 g/dL — ABNORMAL LOW (ref 6.5–8.1)
Total Protein: 6.3 g/dL — ABNORMAL LOW (ref 6.5–8.1)

## 2023-05-10 LAB — GLUCOSE, CAPILLARY
Glucose-Capillary: 124 mg/dL — ABNORMAL HIGH (ref 70–99)
Glucose-Capillary: 134 mg/dL — ABNORMAL HIGH (ref 70–99)

## 2023-05-10 LAB — MAGNESIUM
Magnesium: 5.6 mg/dL — ABNORMAL HIGH (ref 1.7–2.4)
Magnesium: 4.7 mg/dL — ABNORMAL HIGH (ref 1.7–2.4)

## 2023-05-10 MED ORDER — NIFEDIPINE ER OSMOTIC RELEASE 30 MG PO TB24
30.0000 mg | ORAL_TABLET | Freq: Every day | ORAL | Status: DC
  Administered 2023-05-10 – 2023-05-11 (×2): 30 mg via ORAL
  Filled 2023-05-10: qty 1

## 2023-05-10 MED ORDER — TRANEXAMIC ACID-NACL 1000-0.7 MG/100ML-% IV SOLN
1000.0000 mg | Freq: Once | INTRAVENOUS | Status: AC
  Administered 2023-05-10: 1000 mg via INTRAVENOUS

## 2023-05-10 MED ORDER — ONDANSETRON HCL 4 MG PO TABS: 4.0000 mg | ORAL_TABLET | ORAL | Status: DC | PRN

## 2023-05-10 MED ORDER — MAGNESIUM SULFATE 40 GM/1000ML IV SOLN
2.0000 g/h | INTRAVENOUS | Status: AC
  Administered 2023-05-10: 2 g/h via INTRAVENOUS
  Filled 2023-05-10: qty 1000

## 2023-05-10 MED ORDER — IBUPROFEN 800 MG PO TABS
800.0000 mg | ORAL_TABLET | Freq: Three times a day (TID) | ORAL | Status: DC
  Administered 2023-05-10 – 2023-05-12 (×6): 800 mg via ORAL
  Filled 2023-05-10 (×6): qty 1

## 2023-05-10 MED ORDER — INSULIN ASPART 100 UNIT/ML IJ SOLN
0.0000 [IU] | INTRAMUSCULAR | Status: DC
Start: 2023-05-10 — End: 2023-05-10

## 2023-05-10 MED ORDER — DIPHENHYDRAMINE HCL 25 MG PO CAPS: 25.0000 mg | ORAL_CAPSULE | Freq: Four times a day (QID) | ORAL | Status: DC | PRN

## 2023-05-10 MED ORDER — BENZOCAINE-MENTHOL 20-0.5 % EX AERO: 1.0000 | INHALATION_SPRAY | CUTANEOUS | Status: DC | PRN

## 2023-05-10 MED ORDER — SODIUM CHLORIDE 0.9 % IV SOLN: 250.0000 mL | INTRAVENOUS | Status: DC | PRN

## 2023-05-10 MED ORDER — DIBUCAINE (PERIANAL) 1 % EX OINT: 1.0000 | TOPICAL_OINTMENT | CUTANEOUS | Status: DC | PRN

## 2023-05-10 MED ORDER — COCONUT OIL OIL: 1.0000 | TOPICAL_OIL | Status: DC | PRN

## 2023-05-10 MED ORDER — WITCH HAZEL-GLYCERIN EX PADS: 1.0000 | MEDICATED_PAD | CUTANEOUS | Status: DC | PRN

## 2023-05-10 MED ORDER — FUROSEMIDE 40 MG PO TABS
40.0000 mg | ORAL_TABLET | Freq: Every day | ORAL | Status: DC
  Administered 2023-05-10 – 2023-05-12 (×3): 40 mg via ORAL
  Filled 2023-05-10 (×3): qty 1

## 2023-05-10 MED ORDER — SODIUM CHLORIDE 0.9% FLUSH
3.0000 mL | Freq: Two times a day (BID) | INTRAVENOUS | Status: DC
  Administered 2023-05-11: 3 mL via INTRAVENOUS

## 2023-05-10 MED ORDER — ONDANSETRON HCL 4 MG/2ML IJ SOLN: 4.0000 mg | INTRAMUSCULAR | Status: DC | PRN

## 2023-05-10 MED ORDER — SIMETHICONE 80 MG PO CHEW: 80.0000 mg | CHEWABLE_TABLET | ORAL | Status: DC | PRN

## 2023-05-10 MED ORDER — PHENYLEPHRINE HCL (PRESSORS) 10 MG/ML IV SOLN: 0.1000 mg | Freq: Once | INTRAVENOUS | Status: DC | PRN

## 2023-05-10 MED ORDER — SENNOSIDES-DOCUSATE SODIUM 8.6-50 MG PO TABS
2.0000 | ORAL_TABLET | ORAL | Status: DC
  Filled 2023-05-10: qty 2

## 2023-05-10 MED ORDER — SODIUM CHLORIDE 0.9% FLUSH: 3.0000 mL | INTRAVENOUS | Status: DC | PRN

## 2023-05-10 MED ORDER — POTASSIUM CHLORIDE CRYS ER 20 MEQ PO TBCR
40.0000 meq | EXTENDED_RELEASE_TABLET | Freq: Every day | ORAL | Status: DC
  Administered 2023-05-10 – 2023-05-12 (×3): 40 meq via ORAL
  Filled 2023-05-10 (×3): qty 2

## 2023-05-10 MED ORDER — ACETAMINOPHEN 325 MG PO TABS
650.0000 mg | ORAL_TABLET | ORAL | Status: DC | PRN
  Administered 2023-05-11: 650 mg via ORAL
  Filled 2023-05-10: qty 2

## 2023-05-10 MED ORDER — LIDOCAINE-EPINEPHRINE (PF) 2 %-1:200000 IJ SOLN
INTRAMUSCULAR | Status: DC | PRN
  Administered 2023-05-10 (×2): 5 mL via EPIDURAL

## 2023-05-10 MED ORDER — PHENYLEPHRINE 80 MCG/ML (10ML) SYRINGE FOR IV PUSH (FOR BLOOD PRESSURE SUPPORT)
100.0000 ug | PREFILLED_SYRINGE | Freq: Once | INTRAVENOUS | Status: AC | PRN
  Administered 2023-05-10: 80 ug via INTRAVENOUS

## 2023-05-10 MED ORDER — PRENATAL MULTIVITAMIN CH
1.0000 | ORAL_TABLET | Freq: Every day | ORAL | Status: DC
  Administered 2023-05-10 – 2023-05-12 (×3): 1 via ORAL
  Filled 2023-05-10 (×3): qty 1

## 2023-05-10 NOTE — Progress Notes (Addendum)
 Labor Progress Note Denise Gillespie is a 33 y.o. [phone] at [redacted]w[redacted]d presented for IOL for cHTN, now w pre-e w SF  S: Not feeling ctx  O:  BP (!) 84/44   Pulse 90   Temp 98.2 F (36.8 C) (Oral)   Resp 17   Ht 5' (1.524 m)   Wt 135.9 kg   LMP 08/22/2022   SpO2 98%   BMI 58.49 kg/m  EFM: 145/mod/+a/-d  CVE: Dilation: 10 Dilation Complete Date: 05/10/23 Dilation Complete Time: 0030 Effacement (%): 100 Cervical Position: Anterior Station: Plus 1 Presentation: Vertex Exam by:: Dr Hubert Madden   A&P: 33 y.o. [id number] [redacted]w[redacted]d here for IOL for cHTN, now w pre-e w SF; course c/b hypotension causing prolonged decels, now normotensive w cat I fetal tracing  #Labor: Not contracting spontaneously, cx unchanged  discussed IUPC placement and pt consented, IUPC placed and will restart Pit 2x2  foley catheter replaced #Pain: Epidural #FWB: Cat I #GBS negative  #PEC w SF: Cont Mag, BP normotensive  #A2GDM: Latest CBG borderline elevated, will cont to monitor  Melanie Spires, MD 3:13 AM

## 2023-05-10 NOTE — Anesthesia Postprocedure Evaluation (Signed)
 Anesthesia Post Note  Patient: Denise Gillespie  Procedure(s) Performed: AN AD HOC LABOR EPIDURAL     Patient location during evaluation: OB High Risk Anesthesia Type: Epidural Level of consciousness: awake and alert Pain management: pain level controlled Vital Signs Assessment: post-procedure vital signs reviewed and stable Respiratory status: spontaneous breathing, nonlabored ventilation and respiratory function stable Cardiovascular status: stable Postop Assessment: no headache, no backache, epidural receding, no apparent nausea or vomiting, patient able to bend at knees, able to ambulate and adequate PO intake Anesthetic complications: no   No notable events documented.  Last Vitals:  Vitals:   05/10/23 1620 05/10/23 1701  BP:    Pulse:    Resp: 17 18  Temp:    SpO2:      Last Pain:  Vitals:   05/10/23 1500  TempSrc:   PainSc: 0-No pain   Pain Goal: Patients Stated Pain Goal: 0 (05/10/23 0731)                 [name]

## 2023-05-10 NOTE — Discharge Summary (Signed)
 Postpartum Discharge Summary  Date of Service updated***     Patient Name: Denise Gillespie DOB: November 25, 1990 MRN: [id number]  Date of admission: 05/08/2023 Delivery date:05/10/2023 Delivering provider: Melanie Spires Date of discharge: 05/10/2023  Admitting diagnosis: Gestational hypertension [O13.9] Intrauterine pregnancy: [redacted]w[redacted]d     Secondary diagnosis:  Principal Problem:   VBAC, delivered Active Problems:   Severe pre-eclampsia   Gestational hypertension  Additional problems: ***    Discharge diagnosis: Term Pregnancy Delivered, VBAC, Preeclampsia (severe), and GDM A2                                              Post partum procedures:{Postpartum procedures:23558} Augmentation: AROM, Pitocin , and IP Foley Complications: {OB Labor/Delivery Complications:20784}  Hospital course: Induction of Labor With Vaginal Delivery   33 y.o. yo [phone] at [redacted]w[redacted]d was admitted to the hospital 05/08/2023 for induction of labor.  Indication for induction: Gestational hypertension and Preeclampsia.  Patient had an labor course complicated by episode of hypotension following epidural redose, resolved after several rounds of Phenylephrine  and Ephedrine . Membrane Rupture Time/Date: 2:06 PM,05/09/2023  Delivery Method:Vaginal, Spontaneous Operative Delivery:N/A Episiotomy: None Lacerations:  None Details of delivery can be found in separate delivery note.  Patient had a postpartum course complicated by***. Patient is discharged home 05/10/23.  Newborn Data: Birth date:05/10/2023 Birth time:8:51 AM Gender:Female Living status:Living Apgars: ,  Weight:   Magnesium  Sulfate received: Yes: Seizure prophylaxis BMZ received: No Rhophylac:N/A MMR:N/A T-DaP:{Tdap:23962} Flu: {JYN:82956} RSV Vaccine received: No Transfusion:{Transfusion received:30440034}  Immunizations received: Immunization History  Administered Date(s) Administered   Hepatitis B 03/21/2009   Influenza Whole  03/28/2008, 05/09/2009   PFIZER Comirnaty(Gray Top)Covid-19 Tri-Sucrose Vaccine 05/25/2020, 07/06/2020   PPD Test 04/21/2012   Td 03/21/2009   Tdap 03/24/2019    Physical exam  Vitals:   05/10/23 0835 05/10/23 0855 05/10/23 0857 05/10/23 0902  BP: (!) 146/85 (!) 146/77 (!) 153/79 138/86  Pulse: (!) 111 (!) 119 (!) 123 (!) 127  Resp:      Temp:      TempSrc:      SpO2:      Weight:      Height:       General: {Exam; general:21111117} Lochia: {Desc; appropriate/inappropriate:30686::"appropriate"} Uterine Fundus: {Desc; firm/soft:30687} Incision: {Exam; incision:21111123} DVT Evaluation: {Exam; OZH:0865784} Labs: Lab Results  Component Value Date   WBC 12.2 (H) 05/10/2023   HGB 10.4 (L) 05/10/2023   HCT 32.6 (L) 05/10/2023   MCV 84.5 05/10/2023   PLT 229 05/10/2023      Latest Ref Rng & Units 05/10/2023    4:15 AM  CMP  Glucose 70 - 99 mg/dL 696   BUN 6 - 20 mg/dL <5   Creatinine 2.95 - 1.00 mg/dL 2.84   Sodium 132 - 440 mmol/L 132   Potassium 3.5 - 5.1 mmol/L 3.8   Chloride 98 - 111 mmol/L 103   CO2 22 - 32 mmol/L 19   Calcium 8.9 - 10.3 mg/dL 7.6   Total Protein 6.5 - 8.1 g/dL 6.2   Total Bilirubin 0.0 - 1.2 mg/dL 0.7   Alkaline Phos 38 - 126 U/L 51   AST 15 - 41 U/L 30   ALT 0 - 44 U/L 14    Edinburgh Score:    04/23/2019   10:43 AM  Edinburgh Postnatal Depression Scale Screening Tool  I have been able to  laugh and see the funny side of things. 0  I have looked forward with enjoyment to things. 0  I have blamed myself unnecessarily when things went wrong. 0  I have been anxious or worried for no good reason. 0  I have felt scared or panicky for no good reason. 0  Things have been getting on top of me. 1  I have been so unhappy that I have had difficulty sleeping. 0  I have felt sad or miserable. 0  I have been so unhappy that I have been crying. 0  The thought of harming myself has occurred to me. 0  Edinburgh Postnatal Depression Scale Total 1   No  data recorded  After visit meds:  Allergies as of 05/10/2023   No Known Allergies   Med Rec must be completed prior to using this [facility]***        Discharge home in stable condition Infant Feeding:  Formula Infant Disposition:{CHL IP OB HOME WITH WUJWJX:91478} Discharge instruction: per After Visit Summary and Postpartum booklet. Activity: Advance as tolerated. Pelvic rest for 6 weeks.  Diet: carb modified diet Future Appointments:No future appointments. Follow up Visit: Message sent to [facility] 4/27  Please schedule this patient for a In person postpartum visit in 4 weeks with the following provider: MD. Additional Postpartum F/U:Postpartum Depression checkup, 2 hour GTT, and BP check 1 week  High risk pregnancy complicated by: GDM and HTN Delivery mode:  Vaginal, Spontaneous Anticipated Birth Control:  PP Nexplanon placed and Plans Interval BTL   05/10/2023 Melanie Spires, MD

## 2023-05-10 NOTE — Progress Notes (Signed)
 Labor Progress Note  Called to bedside to assess pt. Pt reported increased pain, evaluated by anesthesia and epidural redosed. Anesthesia concerned for uterine rupture given pain increased and requiring redosing to manage. On my arrival to room, cervix checked, was 9/100/-1. FHR in 80s-90s, FSE placed given difficulty tracing FHT. Pitocin  stopped. FHR continued to be ranging between 70s-90s. Terbutaline  given. Multiple doses of Phenylephrine  given, anesthesia called to bedside to assess for need for additional doses of vasoactive agents. FHR briefly in 120s, then decel to 70s. Cervix rechecked was 9.5cm and fetal station at 0. Commenced pushing to attempt to deliver. FHR in 160s, mod variability. Pt still hypotensive, decision made to pause pushing attempts and continue resuscitation efforts to improve BP. On both manual and automatic BP cuffs, BP remained in 80s/50s. FHR have minimal variability at this time. Anesthesia back to reassess and epidural infusion rate decreased. Pt also given repeat doses of Ephedrine  and Phenylephrine  (see MAR) and BP improved to 100s/50s. Plan for now to work on BP, once EFM more reassuring, would reinitiate Pitocin .   Melanie Spires, MD 1:41 AM

## 2023-05-10 NOTE — Progress Notes (Signed)
 Called to assist in evaluation of the patient.   Patient had lower pelvic pain despite epidural. Redose had been given and the patient had relief of pain, but then developed hypotension and resulting fetal decelerations. Terbutaline  given and pitocin  stopped. CE done by myself and Dr. Hubert Madden revealed 9.5/100/0 station. Head is DOA. Foley was removed and we attempted to pushing but pt felt no urge to push and fetal HR had recovered.   Patient given multiple rounds of ephedrine  and phenylephrine  and Dr. Yvonnie Heritage reduced epidural infusion.   Blood pressure now 108/52.  FHT 155, minimal variability, no decels.   Will continue IV fluid bolus at this time.  Prior to hypotension, FHR 140s, mod var, early decels with contractions.   Will allow time for full recovery and rest for patient. Will eventually resume pitocin  2x2.  Anticipate SVD.   Lacey Pian, MD Attending Obstetrician & Gynecologist, [facility] for [facility], [facility]

## 2023-05-10 NOTE — Lactation Note (Signed)
 This note was copied from a baby's chart. Lactation Consultation Note  Patient Name: Denise Gillespie [id number] Date: 05/10/2023 Age:34 hours Reason for consult: Initial assessment  P4- Per MOB's RN in OBSC, MOB will be formula feeding only. Please let LC team know if she is requiring LC services at any time.  Feeding Mother's Current Feeding Choice: Formula  Consult Status Consult Status: Complete Date: 05/10/23    Vernette Goo BS, IBCLC 05/10/2023, 3:33 PM

## 2023-05-11 DIAGNOSIS — Z30017 Encounter for initial prescription of implantable subdermal contraceptive: Secondary | ICD-10-CM | POA: Diagnosis not present

## 2023-05-11 LAB — CBC
HCT: 27.6 % — ABNORMAL LOW (ref 36.0–46.0)
Hemoglobin: 8.8 g/dL — ABNORMAL LOW (ref 12.0–15.0)
MCH: 26.7 pg (ref 26.0–34.0)
MCHC: 31.9 g/dL (ref 30.0–36.0)
MCV: 83.9 fL (ref 80.0–100.0)
Platelets: 224 10*3/uL (ref 150–400)
RBC: 3.29 MIL/uL — ABNORMAL LOW (ref 3.87–5.11)
RDW: 14.9 % (ref 11.5–15.5)
WBC: 12.3 10*3/uL — ABNORMAL HIGH (ref 4.0–10.5)
nRBC: 0 % (ref 0.0–0.2)

## 2023-05-11 LAB — COMPREHENSIVE METABOLIC PANEL WITH GFR
ALT: 11 U/L (ref 0–44)
AST: 21 U/L (ref 15–41)
Albumin: 2.3 g/dL — ABNORMAL LOW (ref 3.5–5.0)
Alkaline Phosphatase: 47 U/L (ref 38–126)
Anion gap: 9 (ref 5–15)
BUN: <5 mg/dL — ABNORMAL LOW (ref 6–20)
CO2: 21 mmol/L — ABNORMAL LOW (ref 22–32)
Calcium: 7.9 mg/dL — ABNORMAL LOW (ref 8.9–10.3)
Chloride: 105 mmol/L (ref 98–111)
Creatinine, Ser: 0.64 mg/dL (ref 0.44–1.00)
GFR, Estimated: >60 mL/min (ref >60)
Glucose, Bld: 138 mg/dL — ABNORMAL HIGH (ref 70–99)
Potassium: 3.7 mmol/L (ref 3.5–5.1)
Sodium: 135 mmol/L (ref 135–145)
Total Bilirubin: 0.5 mg/dL (ref 0.0–1.2)
Total Protein: 5.4 g/dL — ABNORMAL LOW (ref 6.5–8.1)

## 2023-05-11 LAB — CULTURE, BETA STREP (GROUP B ONLY): Strep Gp B Culture: NEGATIVE

## 2023-05-11 MED ORDER — FERROUS SULFATE 325 (65 FE) MG PO TABS
325.0000 mg | ORAL_TABLET | ORAL | Status: DC
  Administered 2023-05-11: 325 mg via ORAL
  Filled 2023-05-11: qty 1

## 2023-05-11 MED ORDER — ETONOGESTREL 68 MG ~~LOC~~ IMPL
68.0000 mg | DRUG_IMPLANT | Freq: Once | SUBCUTANEOUS | Status: AC
  Administered 2023-05-11: 68 mg via SUBCUTANEOUS
  Filled 2023-05-11: qty 1

## 2023-05-11 MED ORDER — NIFEDIPINE ER OSMOTIC RELEASE 30 MG PO TB24
30.0000 mg | ORAL_TABLET | Freq: Two times a day (BID) | ORAL | Status: DC
  Administered 2023-05-11 – 2023-05-12 (×2): 30 mg via ORAL
  Filled 2023-05-11 (×2): qty 1

## 2023-05-11 MED ORDER — LIDOCAINE HCL 1 % IJ SOLN
0.0000 mL | Freq: Once | INTRAMUSCULAR | Status: AC | PRN
  Administered 2023-05-11: 20 mL via INTRADERMAL
  Filled 2023-05-11: qty 20

## 2023-05-11 NOTE — Progress Notes (Signed)
 PPD1 s/p VBAC in s/o severe PreE  Subjective: no complaints, up ad lib, voiding, tolerating PO, and + flatus  Objective: Blood pressure (!) 140/78, pulse 82, temperature 97.8 F (36.6 C), temperature source Oral, resp. rate 18, height 5' (1.524 m), weight 135.9 kg, last menstrual period 08/22/2022, SpO2 98%, unknown if currently breastfeeding.  Physical Exam:  General: alert, cooperative, and no distress Lochia: appropriate Uterine Fundus: not palpable Incision: NA DVT Evaluation: No evidence of DVT seen on physical exam.  Recent Labs    05/10/23 1704 05/11/23 0527  HGB 10.2* 8.8*  HCT 31.3* 27.6*    Assessment/Plan: Postpartum - Contraception: Nexplanon to bridge to interval salpingectomy. She would like this prior to discharge - MOF: breast and bottle - Rh status: Positive - Rubella status: Immune - Dispo: PPD2 most likely   Severe Preeclampsia - Magnesium  off at 830 - Bps mild range - Will start Procardia 30 with lasix/K - CMP wnl  Anemia -Patient was started on oral iron  therapy for clinically significant but asymptomatic acute post-delivery anemia due to expected blood loss.  Neonatal - Doing well - Circumcision: Consent reviewed and placed in baby chart.    LOS: 3 days   Lacey Pian 05/11/2023, 7:47 AM

## 2023-05-11 NOTE — Clinical Social Work Maternal (Signed)
 CLINICAL SOCIAL WORK MATERNAL/CHILD NOTE  Patient Details  Name: Denise Gillespie MRN: [id number] Date of Birth: [birth year]  Date:  05/11/2023  Clinical Social Worker Initiating Note:  Nickolas Barr, [place] Date/Time: Initiated:  05/11/23/1207     Child's Name:  Denise Gillespie   Biological Parents:  Mother (MOB: Denise Gillespie 1990/11/18)   Need for Interpreter:  None   Reason for Referral:  Other (Comment) (Behavioral health support - FOB passed away during pregnancy)   Address:  [address]  [place] [address]    Phone number:  [phone] (home)     Additional phone number:   Household Members/Support Persons (HM/SP):   Household Member/Support Person 1, Household Member/Support Person 2, Household Member/Support Person 3   HM/SP Name Relationship DOB or Age  HM/SP -1 Denise Gillespie Cha Daughter 05/11/2007  HM/SP -2 Denise Gillespie Daughter 10/15/2008  HM/SP -3 Denise Gillespie Son 04/22/2019  HM/SP -4        HM/SP -5        HM/SP -6        HM/SP -7        HM/SP -8          Natural Supports (not living in the home):  Friends   Professional Supports: Paramedic   Employment: Environmental education officer   Type of Work: Water quality scientist at [employer]   Education:  Environmental manager   Homebound arranged:    Surveyor, quantity Resources:  [employer]   Other Resources:  Sales executive   (Applying for [employer])   Cultural/Religious Considerations Which May Impact Care:    Strengths:  Ability to meet basic needs  , Home prepared for child     Psychotropic Medications:         Pediatrician:       Pediatrician List:   Radiographer, therapeutic    [place]    [facility]      Pediatrician Fax Number:    Risk Factors/Current Problems:  Mental Health Concerns     Cognitive State:  Able to Concentrate  , Goal Oriented  , Alert  , Insightful     Mood/Affect:  Comfortable  , Calm  , Interested     CSW Assessment: CSW received  consult for Behavioral Health support-FOB passed away during the pregnancy. CSW met with MOB to complete assessment and offer support.   CSW met with MOB at bedside and introduced CSW role. CSW observed MOB holding and bonding with the baby as evidenced by her talking and kissing the baby. CSW congratulated MOB on baby boy, Creed. MOB presented calm and engaged with CSW throughout the entire visit. CSW confirmed with MOB that the demographic information on hospital file was correct. CSW inquired about MOB household. MOB reported that she lived with her three other children (see chart above) and works as a Water quality scientist at [employer]. MOB reported that she received SNAP, EBT and had a walk-in appointment for [facility] benefits. CSW inquired about MOB support system. MOB identified her friend, and her children as supports.  CSW asked MOB how she had been doing since giving birth. MOB described her pregnancy as very stressful and emotional. MOB stated that she was glad that the pregnancy was over, and she felt better. MOB shared that FOB unexpectedly passed away last 12-09-23 and she had been experiencing symptoms of depression. MOB reported that she currently sees a grief therapist at [facility],  twice a month for therapy. MOB expressed that her therapy sessions have been very effective and that she will continue therapy session during the postpartum period. MOB mentioned that she has access to her therapist outside of their scheduled appointment times if she needs support. MOB shared that she is a part of a community-based support group, [facility] that she attends once a month and feels very supported by individuals in the group. CSW acknowledged and validated MOB's ability to seek treatment for her grief and depression symptoms. MOB stated that she would like to set a positive example for her children by allowing them to see their mom seek treatment for her mental health.   CSW provided  education regarding the baby blues period vs. perinatal mood disorders, discussed treatment and gave additional resources for mental health. CSW recommended MOB completed a self-evaluation during the postpartum time period using the New Mom Checklist from Postpartum Progress and encouraged MOB to contact a medical professional if symptoms are noted at any time. MOB thanked CSW for the resources. CSW assessed MOB for safety. MOB denied SI/HI.  CSW inquired if MOB had all essential items to care for the infant. MOB reported that she had all essential items including a crib and carseat. CSW asked MOB if she had chosen a pediatrician for the infant. MOB reported that she was given a list of pediatrician in the area would decide before discharge. CSW assessed MOB for additional needs. MOB reported none.  CSW provided review of Sudden Infant Death Syndrome (SIDS) precautions.  MOB reported that she understood. CSW identified no further need for intervention and no barriers to discharge at this time.  CSW Plan/Description:  Perinatal Mood and Anxiety Disorder (PMADs) Education, No Further Intervention Required/No Barriers to Discharge, Sudden Infant Death Syndrome (SIDS) Education    Rebecka Can, LCSW 05/11/2023, 12:47 PM

## 2023-05-11 NOTE — Procedures (Signed)
 Immediate Post-Partum Nexplanon Insertion Procedure Note  Patient was identified.Risks and benefits of Nexplanon reviewed.  Informed consent was signed, signed copy in chart. A time-out was performed.    The insertion site was identified 8-10 cm (3-4 inches) from the medial epicondyle of the humerus and 3-5 cm (1.25-2 inches) posterior to (below) the sulcus (groove) between the biceps and triceps muscles of the patient's left arm and marked. The site was prepped and draped in the usual sterile fashion. Pt was prepped with alcohol swab and then injected with 2.5 cc of 1% lidocaine . The site was prepped with betadine. Nexplanon remoed form packaging,  Device confirmed in needle, then inserted full length of needle and withdrawn per handbook instructions. Provider and patient verified presence of the implant in the woman's arm by palpation. Pt insertion site was covered with steristrips/adhesive bandage and pressure bandage. There was minimal blood loss. Patient tolerated procedure well.  Patient was given post procedure instructions and Nexplanon user card with expiration date. Condoms were recommended for STI prevention. Patient was asked to keep the pressure dressing on for 24 hours to minimize bruising and keep the adhesive bandage on for 3-5 days. The patient verbalized understanding of the plan of care and agrees.

## 2023-05-12 ENCOUNTER — Other Ambulatory Visit (HOSPITAL_COMMUNITY): Payer: Self-pay

## 2023-05-12 MED ORDER — POTASSIUM CHLORIDE CRYS ER 20 MEQ PO TBCR
40.0000 meq | EXTENDED_RELEASE_TABLET | Freq: Every day | ORAL | 0 refills | Status: DC
  Filled 2023-05-12: qty 4, 2d supply, fill #0

## 2023-05-12 MED ORDER — POTASSIUM CHLORIDE CRYS ER 20 MEQ PO TBCR
40.0000 meq | EXTENDED_RELEASE_TABLET | Freq: Every day | ORAL | 0 refills | Status: DC
  Filled 2023-05-12: qty 7, 3d supply, fill #0

## 2023-05-12 MED ORDER — NIFEDIPINE ER 30 MG PO TB24
30.0000 mg | ORAL_TABLET | Freq: Two times a day (BID) | ORAL | 0 refills | Status: AC
Start: 2023-05-12 — End: ?
  Filled 2023-05-12: qty 60, 30d supply, fill #0

## 2023-05-12 MED ORDER — FUROSEMIDE 40 MG PO TABS
40.0000 mg | ORAL_TABLET | Freq: Every day | ORAL | 0 refills | Status: DC
  Filled 2023-05-12: qty 5, 5d supply, fill #0

## 2023-05-12 MED ORDER — IBUPROFEN 400 MG PO TABS
800.0000 mg | ORAL_TABLET | Freq: Three times a day (TID) | ORAL | 0 refills | Status: AC
  Filled 2023-05-12: qty 60, 10d supply, fill #0

## 2023-05-12 MED ORDER — FUROSEMIDE 40 MG PO TABS
40.0000 mg | ORAL_TABLET | Freq: Every day | ORAL | 0 refills | Status: AC
  Filled 2023-05-12: qty 2, 2d supply, fill #0

## 2023-05-14 ENCOUNTER — Encounter: Admitting: Obstetrics & Gynecology

## 2023-05-19 ENCOUNTER — Ambulatory Visit

## 2023-05-20 NOTE — BH Specialist Note (Signed)
 Integrated Behavioral Health via Telemedicine Visit  06/03/2023 Denise Gillespie [id number]  Number of Integrated Behavioral Health Clinician visits: 1- Initial Visit  Session Start time: [phone]   Session End time: 0922  Total time in minutes: 36   Referring Provider: Verlyn Goad, MD Patient/Family location: [facility] Provider location: Center for [facility] at [facility]  All persons participating in visit: Patient Denise Gillespie and [facility] Denise Gillespie   Types of Service: Individual psychotherapy and Video visit  I connected with Zyon Karolyn Pacini and/or Marcey Lajoy Brigandi's n/a via  Telephone or Video Enabled Telemedicine Application  (Video is Caregility application) and verified that I am speaking with the correct person using two identifiers. Discussed confidentiality: Yes   I discussed the limitations of telemedicine and the availability of in person appointments.  Discussed there is a possibility of technology failure and discussed alternative modes of communication if that failure occurs.  I discussed that engaging in this telemedicine visit, they consent to the provision of behavioral healthcare and the services will be billed under their insurance.  Patient and/or legal guardian expressed understanding and consented to Telemedicine visit: Yes   Presenting Concerns: Patient and/or family reports the following symptoms/concerns: Overwhelmed, anxious, depressed, overstimulated, poor sleep quality; attributes to adjusting to postpartum time, while grieving loss of baby's father during pregnancy. Pt is coping with support groups, practical support from family, using sleep sounds at night. Pt has tried medication in the past (Bupropion and another unknown medication didn't work to alleviate symptoms); pt is open to referral to psychiatry, as well as implementing additional self-coping strategy today.  Duration of problem:  Increase perinatal; Severity of problem: moderately severe  Patient and/or Family's Strengths/Protective Factors: Social connections, Concrete supports in place (healthy food, safe environments, etc.), and Sense of purpose  Goals Addressed: Patient will:  Reduce symptoms of: anxiety and depression   Increase knowledge and/or ability of: healthy habits and self-management skills   Demonstrate ability to: Increase healthy adjustment to current life circumstances, Increase adequate support systems for patient/family, and Increase motivation to adhere to plan of care  Progress towards Goals: Ongoing  Interventions: Interventions utilized:  Mindfulness or Management consultant, Psychoeducation and/or Health Education, Link to [employer], and Supportive Reflection Standardized Assessments completed: GAD-7 and PHQ 9  Patient and/or Family Response: Patient agrees with treatment plan.   Assessment: Patient currently experiencing Major depressive disorder, recurrent, severe, without psychotic features; Generalized anxiety disorder; Grief.   Patient may benefit from psychoeducation and brief therapeutic interventions regarding coping with symptoms of depression, anxiety .  Plan: Follow up with behavioral health clinician on : Three weeks Behavioral recommendations:  -Accept referral to psychiatry for [facility] medication management -CALM relaxation breathing exercise twice daily (morning; at bedtime with sleep sounds); as needed throughout the day. -Continue attending grief and other support groups, as discussed -Continue prioritizing healthy self-care (regular meals, adequate rest; allowing practical help from supportive friends and family) until at least postpartum medical appointment -Consider new mom support group as needed at either www.[email or web address].net or www.conehealthybaby.com   Referral(s): Integrated Art gallery manager (In Clinic), Community Mental Health Services  (LME/Outside Clinic), and Community Resources:  New mom support; Childcare  I discussed the assessment and treatment plan with the patient and/or parent/guardian. They were provided an opportunity to ask questions and all were answered. They agreed with the plan and demonstrated an understanding of the instructions.   They were advised to call back or seek an in-person evaluation if  the symptoms worsen or if the condition fails to improve as anticipated.  Georgia Kipper, LCSW     06/03/2023    8:58 AM 05/07/2023    5:15 PM 11/10/2022    5:31 PM 06/20/2021    9:39 AM 10/23/2020    2:12 PM  Depression screen PHQ 2/9  Decreased Interest 0 3 1 2 1   Down, Depressed, Hopeless 3 1 1 1  0  PHQ - 2 Score 3 4 2 3 1   Altered sleeping 3 3 2 2 2   Tired, decreased energy 3 2 2 2 2   Change in appetite 3 1 2 2  0  Feeling bad or failure about yourself  1 0 0 0 0  Trouble concentrating 3 1 0 1 3  Moving slowly or fidgety/restless 0 0 0 0 0  Suicidal thoughts 0 0 0 0 0  PHQ-9 Score 16 11 8 10 8   Difficult doing work/chores     Somewhat difficult      06/03/2023    9:00 AM 11/10/2022    5:31 PM 04/21/2019    8:30 AM 03/24/2019    8:33 AM  GAD 7 : Generalized Anxiety Score  Nervous, Anxious, on Edge 3 1 3 2   Control/stop worrying 1 0 1 2  Worry too much - different things 3 0 1 2  Trouble relaxing 3 1 2 1   Restless 3 0 2 1  Easily annoyed or irritable 3 2 3 3   Afraid - awful might happen 0 0 0 0  Total GAD 7 Score 16 4 12  11

## 2023-05-23 ENCOUNTER — Telehealth (HOSPITAL_COMMUNITY): Payer: Self-pay

## 2023-05-23 NOTE — Telephone Encounter (Signed)
 05/23/2023 1416  Name: Denise Gillespie MRN: [id number] DOB: Mar 06, 1990  Reason for Call:  Transition of Care Hospital Discharge Call  Contact Status: Patient Contact Status: Complete  Language assistant needed:          Follow-Up Questions: Do You Have Any Concerns About Your Health As You Heal From Delivery?: No Do You Have Any Concerns About Your Infants Health?: Yes What Concerns Do You Have About Your Baby?: Patient states that baby has some yellow drainage from both eyes. She states that her pediatrician is aware and prescribed some gel. Patient states she is putting the medication in the eyes. RN reviewed cleaning the eyes. RN told patient to reach out to her pediatrician if eye drainage is not improving or with concerns. Patient states baby is doing well and feeding well. Patient has no other questions or concerns about baby at this time.  Edinburgh Postnatal Depression Scale:  In the Past 7 Days: I have been able to laugh and see the funny side of things.: As much as I always could I have looked forward with enjoyment to things.: As much as I ever did I have blamed myself unnecessarily when things went wrong.: No, never I have been anxious or worried for no good reason.: No, not at all I have felt scared or panicky for no good reason.: No, not at all Things have been getting on top of me.: No, I have been coping as well as ever I have been so unhappy that I have had difficulty sleeping.: Not at all I have felt sad or miserable.: No, not at all I have been so unhappy that I have been crying.: No, never The thought of harming myself has occurred to me.: Never [name] Postnatal Depression Scale Total: 0  PHQ2-9 Depression Scale:     Discharge Follow-up: Edinburgh score requires follow up?: No Patient was advised of the following resources:: Breastfeeding Support Group, Support Group  Post-discharge interventions: Reviewed Newborn Safe Sleep  Practices  Signature  Wadell Guild

## 2023-06-03 ENCOUNTER — Ambulatory Visit: Payer: Self-pay | Admitting: Clinical

## 2023-06-03 DIAGNOSIS — F411 Generalized anxiety disorder: Secondary | ICD-10-CM

## 2023-06-03 DIAGNOSIS — F332 Major depressive disorder, recurrent severe without psychotic features: Secondary | ICD-10-CM

## 2023-06-03 DIAGNOSIS — F4321 Adjustment disorder with depressed mood: Secondary | ICD-10-CM

## 2023-06-03 NOTE — Addendum Note (Signed)
 Addended by: Kin Penner C on: 06/03/2023 01:40 PM   Modules accepted: Orders

## 2023-06-03 NOTE — Patient Instructions (Signed)
 Center for [facility] Healthcare at [facility] for Women [address] [place], [place] [address] [phone] (main office) [phone] [facility] office)  New Parent Support Groups www.[email or web address].net www.[email or web address]  [place]  (Childcare options, Early childcare development, etc.) www.[place]children.org  [employer] Child Care Facility Search Engine  [email or web address]

## 2023-06-12 ENCOUNTER — Encounter (HOSPITAL_COMMUNITY): Payer: Self-pay | Admitting: Student

## 2023-06-12 ENCOUNTER — Ambulatory Visit (HOSPITAL_COMMUNITY): Admitting: Student

## 2023-06-12 VITALS — BP 155/98 | HR 74 | Ht 61.22 in | Wt 275.0 lb

## 2023-06-12 DIAGNOSIS — F410 Panic disorder [episodic paroxysmal anxiety] without agoraphobia: Secondary | ICD-10-CM | POA: Diagnosis not present

## 2023-06-12 DIAGNOSIS — F411 Generalized anxiety disorder: Secondary | ICD-10-CM | POA: Diagnosis not present

## 2023-06-12 DIAGNOSIS — F4310 Post-traumatic stress disorder, unspecified: Secondary | ICD-10-CM | POA: Diagnosis not present

## 2023-06-12 DIAGNOSIS — F339 Major depressive disorder, recurrent, unspecified: Secondary | ICD-10-CM | POA: Diagnosis not present

## 2023-06-12 MED ORDER — FLUOXETINE HCL 20 MG PO CAPS
20.0000 mg | ORAL_CAPSULE | Freq: Every morning | ORAL | 0 refills | Status: AC
Start: 2023-06-12 — End: 2023-08-11

## 2023-06-12 MED ORDER — FLUOXETINE HCL 10 MG PO CAPS: 10.0000 mg | ORAL_CAPSULE | Freq: Every morning | ORAL | 0 refills | Status: AC
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
## 2023-06-16 ENCOUNTER — Other Ambulatory Visit: Payer: Self-pay

## 2023-06-16 DIAGNOSIS — Z8632 Personal history of gestational diabetes: Secondary | ICD-10-CM

## 2023-06-19 ENCOUNTER — Encounter (HOSPITAL_COMMUNITY): Payer: Self-pay | Admitting: Student

## 2023-06-19 ENCOUNTER — Ambulatory Visit: Payer: Self-pay | Admitting: Student

## 2023-06-19 ENCOUNTER — Other Ambulatory Visit: Payer: Self-pay

## 2023-06-26 ENCOUNTER — Ambulatory Visit: Admitting: Clinical

## 2023-06-26 DIAGNOSIS — Z91199 Patient's noncompliance with other medical treatment and regimen due to unspecified reason: Secondary | ICD-10-CM

## 2023-06-26 NOTE — BH Specialist Note (Signed)
 Pt did not arrive to video visit and did not answer the phone; Left HIPPA-compliant message to call back Carolyn Cisco from [employer] for [employer] at [facility] at  [phone] [facility] office).  ?; left MyChart message for patient.  ? ?
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
## 2023-07-14 ENCOUNTER — Encounter (HOSPITAL_COMMUNITY): Payer: Self-pay | Admitting: Student in an Organized Health Care Education/Training Program

## 2023-08-14 ENCOUNTER — Ambulatory Visit (HOSPITAL_COMMUNITY): Payer: Self-pay | Admitting: Clinical

## 2023-08-14 DIAGNOSIS — F331 Major depressive disorder, recurrent, moderate: Secondary | ICD-10-CM | POA: Diagnosis not present
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
## 2023-09-02 NOTE — Progress Notes (Deleted)
 BH MD Outpatient Progress Note  09/02/2023 4:58 PM Denise Gillespie  MRN:  [id number]  Assessment:  Denise Gillespie presents for follow-up evaluation in-person on 09/02/23 .   The patient has the working diagnoses of ***  Chart Review Recent encounters since last visit: *** Recent Labs/Imaging since last visit: ***  Identifying Information: Denise Gillespie is a 33 y.o. female with a recent working diagnosis of PTSD as well as a documented history of MDD, GAD, no suicide attempt or inpatient psych admission who is an established patient with [facility] for management of mood and medications. Initial evaluation by Dr. Nguyen completed on 06/12/2023. For a comprehensive history and detailed assessment, please refer to the initial adult assessment.  The patient's PMHx is significant for ***.   Plan:  # GAD w panic attacks # PTSD w nightmares Past medication trials: prozac , zoloft , buspar , trazodone  Status of problem: new to me P/w exacerbation in anxiety sxs with 3-4x panic attacks a week since 04/2023 after baby's birth. Nearly to the point of agoraphobia, but is still able to go out to grocery stores. Onset of current anxiety episode was 2023 after step-dad's and MGM's death. No psych hx prior to that. Would have ~2x panic attacks a week, which PCP gave her buspar , stopped because of perceived ineffectiveness, inadequate trial. Also suffering from poor sleep due to nightmares from past trauma. Visibly upset, hypervigilant. Never tired prazosin, starting it for nightmares. Talked about the SSRIs, but opted for prozac  due to concern for adherence, as she tends to forget her meds at times.  Interventions: Therapist: Carlyon CINDERELLA Morin, LCSW Labs: TSH/FT4, VitD, B12/folate - coordinating with obgyn  Recent labs showed anemia, on PO iron . Low calcium ***STARTED prozac  10 mg x7days -> 20 mg qAM (s5/30/2025)  ***STARTED prazosin 1 mg qPM (s5/30/2025)     # MDD, recurrent, severe, melancholic Past medication trials: see above Status of problem: new to me 8/9 sxs of MDD, melancholic type (see my 06/12/2023 note). No active or passive SI. Never been on treatment SSRI, SNRI Currently biggest concern was low energy, for this reason opted for prozac  for activating side effects. Interventions: ***SSRI per above   # Nicotine use d/o Past medication trials: patches Status of problem: new to me On and off for years. Started up again ~04/2023 after baby's birth. Quit during the pregnancy Contemplative Interventions: Encouraged cessation NRT - patches Provided 1-800-QUIT line for free NRTs  # *** Past medication trials:  Status of problem: *** Interventions: -- ***  Health Maintenance PCP: Diona Perkins, MD   # Normocytic anemia/HO IDA - iron  supplements and repeat labs per pcp # HS    Not breastfeeding  Patient was given contact information for behavioral health clinic and was instructed to call 911 for emergencies.   Subjective:  Chief Complaint: No chief complaint on file.   Interval History:  ***  During the patient's previous visit, *** was discussed, which they report ***.  AEs to medications: Medication compliance (missing doses, taking as directed):  Sleep: Appetite: Caffeine : Recent substance use: SI: Impact on functioning: ***   Visit Diagnosis: No diagnosis found.  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 Buspar  added a few months later the same year Medication trials:  Prozac  (05/2017, dc for activating side effects) -> zoloft  (08/2017-11/2018, 100 mg, self-dc unclear why) Buspar  (2019) Trazodone  Suicide attempts: denied  Hospitalizations: denied Previous psychiatrist/therapist:  [facility] in 2023 then lost to f/u Hx of violence towards others: denied Current access to guns: denied Hx  of trauma/abuse: denied   Substance Use History: EtOH:  reports that she does not currently use alcohol after a past usage of about 5.0 standard drinks of alcohol per week. A lot before her 3rd youngest was born 1/5 a day and a gallon for the weekend - tequilla usually Nicotine: started 33yo - stopped during pregnancy, switch to vape around 2023. Marijuana: started 33yo - up until her oldest kid was born Stimulants: denied Opiates: denied Sedative/hypnotics: denied Hallucinogens: denied DT: denied Treatment: Detox: denied Residential: denied    Past Medical History: Dx:  has a past medical history of Abscess, Anemia, iron  deficiency, Depression, Fibroid, Gestational diabetes, Hidradenitis, History of gestational diabetes (03/27/2019), Obesity, Prediabetes (01/02/2023), Pregnancy induced hypertension, and Trauma in childhood.  Allergies: Patient has no known allergies. Seasonal  Head trauma: denied Seizures: denied   Family Psychiatric History:  Suicide: maternal cousin completed suicide when she was 26yo Homicide: denied Psych hospitalization: mom BiPD: Denied SCZ/SCzA: denied Substance use: mom with high risk drinking in the past Others: mom and brother with depression and anxiety   Social History:  Housing: her and 4 kid Employment:  Maternal leave July 27 2023 going back to work Full time  Marital Status: single Children:  4x, born - 2009, 2010, 2021, 2025 Support: friend Family:  Mom lives nearby, dependent on her Education: some college for nursing school Plan to go back when baby is older Armed forces operational officer: denied Developmental: denied  Social History   Socioeconomic History   Marital status: Single    Spouse name: Not on file   Number of children: Not on file   Years of education: Not on file   Highest education level: Not on file  Occupational History   Not on file  Tobacco Use   Smoking status: Former    Current packs/day: 0.00    Types: Cigarettes    Quit  date: 09/18/2018    Years since quitting: 4.9   Smokeless tobacco: Never   Tobacco comments:    quit when found out pregnant   Vaping Use   Vaping status: Former   Quit date: 01/13/2020   Substances: Nicotine, Flavoring  Substance and Sexual Activity   Alcohol use: Not Currently    Alcohol/week: 5.0 standard drinks of alcohol    Types: 5 Glasses of wine per week    Comment: -   Drug use: Not Currently    Types: Marijuana    Comment: last use 15 yrs ago   Sexual activity: Not Currently  Other Topics Concern   Not on file  Social History Narrative   Not on file   Social Drivers of Health   Financial Resource Strain: Not on file  Food Insecurity: No Food Insecurity (05/08/2023)   Hunger Vital Sign    Worried About Running Out of Food in the Last Year: Never true    Ran Out of Food in the Last Year: Never true  Transportation Needs: No Transportation Needs (05/08/2023)   PRAPARE - Administrator, Civil Service (Medical): No    Lack of Transportation (Non-Medical): No  Physical Activity: Not on file  Stress: Not on file  Social Connections: Unknown (05/27/2021)   [facility]   Social Network    Social Network: Not on file    Allergies: No Known Allergies  Current  Medications: Current Outpatient Medications  Medication Sig Dispense Refill   FEROSUL 325 (65 Fe) MG tablet Take 325 mg by mouth every other day.     FLUoxetine  (PROZAC ) 10 MG capsule Take 1 capsule (10 mg total) by mouth every morning for 7 days. 7 capsule 0   FLUoxetine  (PROZAC ) 20 MG capsule Take 1 capsule (20 mg total) by mouth every morning. 60 capsule 0   furosemide  (LASIX ) 40 MG tablet Take 1 tablet (40 mg total) by mouth daily for 2 days. 2 tablet 0   ibuprofen  (ADVIL ) 400 MG tablet Take 2 tablets (800 mg total) by mouth every 8 (eight) hours. 60 tablet 0   NIFEdipine  (ADALAT  CC) 30 MG 24 hr tablet Take 1 tablet (30 mg total) by mouth every 12 (twelve) hours. 60 tablet 0    Prenatal 28-0.8 MG TABS Take 1 tablet by mouth daily. 30 tablet 12   No current facility-administered medications for this visit.    ROS: Review of Systems  Objective:  Objective: Psychiatric Specialty Exam: General Appearance: Casual, fairly groomed  Eye Contact:  Good    Speech:  Clear, coherent, normal rate, spontaneous  Volume:  Normal   Mood:  see above  Affect:  Appropriate, congruent, full range  Thought Content: Logical, rumination  ***  Suicidal Thoughts: see subjective  Thought Process:  Coherent, goal-directed, circumstantial ***  Orientation:  A&Ox4   Memory:  Immediate good  Judgment:  Fair   Insight:  Fair***  Concentration:  Attention and concentration good   Recall:  Good  Fund of Knowledge: Good  Language: Good, fluent  Psychomotor Activity: Normal  Akathisia:  NA   AIMS (if indicated): NA   Assets:   {Assets (PAA):22698}  ADL's:  Intact  Cognition: WNL  Sleep: see above  Appetite: see above    Physical Exam   Metabolic Disorder Labs: Lab Results  Component Value Date   HGBA1C 6.0 (H) 11/10/2022   MPG 120 04/22/2019   No results found for: PROLACTIN No results found for: CHOL, TRIG, HDL, CHOLHDL, VLDL, LDLCALC No results found for: TSH  Therapeutic Level Labs: No results found for: LITHIUM No results found for: VALPROATE No results found for: CBMZ  Screenings:  GAD-7    Flowsheet Row Integrated [facility]al [facility] Initial Prenatal from 11/10/2022 in Center [facility] for Women Routine Prenatal from 04/21/2019 in Center for [facility] Routine Prenatal from 03/24/2019 in Center for [facility] Initial Prenatal from 11/30/2018 in Center for [facility]  Total GAD-7 Score 16 4 12 11 3    PHQ2-9    Flowsheet Row Integrated Behavioral Health from  06/03/2023 in [facility] for Women Routine Prenatal from 05/07/2023 in [facility] for Women Initial Prenatal from 11/10/2022 in [facility] for Women Office Visit from 06/20/2021 in [facility] Of [place]. [facility] Office Visit from 10/23/2020 in [facility] Of [place]. [facility]  PHQ-2 Total Score 3 4 2 3 1   PHQ-9 Total Score 16 11 8 10 8    Flowsheet Row Admission (Discharged) from 05/08/2023 in [place] [facility]S [facility] Specialty Care Admission (Discharged) from 04/16/2023 in [facility] 1S Maternity Assessment Unit Admission (Discharged) from 10/20/2022 in [facility] 1S  Maternity Assessment Unit  C-SSRS RISK CATEGORY No Risk No Risk No Risk    Collaboration of Care:   Patient/Guardian was advised Release of Information must be obtained prior to any record release in order to collaborate their care with an outside provider. Patient/Guardian was advised if they have not already done so to contact the registration department to sign all necessary forms in order for us  to release information regarding their care.   Consent: Patient/Guardian gives verbal consent for treatment and assignment of benefits for services provided during this visit. Patient/Guardian expressed understanding and agreed to proceed.    Marlo Masson, MD 09/02/2023, 4:58 PM

## 2023-09-03 ENCOUNTER — Encounter (HOSPITAL_COMMUNITY): Payer: Self-pay | Admitting: Student in an Organized Health Care Education/Training Program

## 2023-09-18 ENCOUNTER — Encounter (HOSPITAL_COMMUNITY): Admitting: Student in an Organized Health Care Education/Training Program

## 2023-09-18 ENCOUNTER — Telehealth (HOSPITAL_COMMUNITY): Payer: Self-pay | Admitting: Student in an Organized Health Care Education/Training Program

## 2023-09-18 NOTE — Progress Notes (Deleted)
 BH MD Outpatient Progress Note  09/18/2023 7:44 AM Denise Gillespie  MRN:  [id number]  Assessment:  Denise Gillespie presents for follow-up evaluation in-person on 09/18/23 .   The patient has the working diagnoses of ***  Chart Review Recent encounters since last visit: *** Recent Labs/Imaging since last visit: ***  Identifying Information: Denise Gillespie is a 33 y.o. female with a recent working diagnosis of PTSD as well as a documented history of MDD, GAD, no suicide attempt or inpatient psych admission who is an established patient with [facility] for management of mood and medications. Initial evaluation by Dr. Nguyen completed on 06/12/2023. For a comprehensive history and detailed assessment, please refer to the initial adult assessment.  The patient's PMHx is significant for ***.   Plan:  # GAD w panic attacks # PTSD w nightmares Past medication trials: prozac , zoloft , buspar , trazodone  Status of problem: new to me P/w exacerbation in anxiety sxs with 3-4x panic attacks a week since 04/2023 after baby's birth. Nearly to the point of agoraphobia, but is still able to go out to grocery stores. Onset of current anxiety episode was 2023 after step-dad's and MGM's death. No psych hx prior to that. Would have ~2x panic attacks a week, which PCP gave her buspar , stopped because of perceived ineffectiveness, inadequate trial. Also suffering from poor sleep due to nightmares from past trauma. Visibly upset, hypervigilant. Never tired prazosin, starting it for nightmares. Talked about the SSRIs, but opted for prozac  due to concern for adherence, as she tends to forget her meds at times.  Interventions: Therapist: Carlyon CINDERELLA Morin, LCSW Labs: TSH/FT4, VitD, B12/folate - coordinating with obgyn  Recent labs showed anemia, on PO iron . Low calcium ***STARTED prozac  10 mg x7days -> 20 mg qAM (s5/30/2025)  ***STARTED prazosin 1 mg qPM (s5/30/2025)     # MDD, recurrent, severe, melancholic Past medication trials: see above Status of problem: new to me 8/9 sxs of MDD, melancholic type (see my 06/12/2023 note). No active or passive SI. Never been on treatment SSRI, SNRI Currently biggest concern was low energy, for this reason opted for prozac  for activating side effects. Interventions: ***SSRI per above   # Nicotine use d/o Past medication trials: patches Status of problem: new to me On and off for years. Started up again ~04/2023 after baby's birth. Quit during the pregnancy Contemplative Interventions: Encouraged cessation NRT - patches Provided 1-800-QUIT line for free NRTs  # *** Past medication trials:  Status of problem: *** Interventions: -- ***  Health Maintenance PCP: Diona Perkins, MD   # Normocytic anemia/HO IDA - iron  supplements and repeat labs per pcp # HS    Not breastfeeding  Patient was given contact information for behavioral health clinic and was instructed to call 911 for emergencies.   Subjective:  Chief Complaint: No chief complaint on file.   Interval History:  ***  During the patient's previous visit, *** was discussed, which they report ***.  AEs to medications: Medication compliance (missing doses, taking as directed):  Sleep: Appetite: Caffeine : Recent substance use: SI: Impact on functioning: ***   Visit Diagnosis: No diagnosis found.  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 Buspar  added a few months later the same year Medication trials:  Prozac  (05/2017, dc for activating side effects) -> zoloft  (08/2017-11/2018, 100 mg, self-dc unclear why) Buspar  (2019) Trazodone  Suicide attempts: denied  Hospitalizations: denied Previous psychiatrist/therapist:  [facility] in 2023 then lost to f/u Hx of violence towards others: denied Current access to guns: denied Hx  of trauma/abuse: denied   Substance Use History: EtOH:  reports that she does not currently use alcohol after a past usage of about 5.0 standard drinks of alcohol per week. A lot before her 3rd youngest was born 1/5 a day and a gallon for the weekend - tequilla usually Nicotine: started 33yo - stopped during pregnancy, switch to vape around 2023. Marijuana: started 33yo - up until her oldest kid was born Stimulants: denied Opiates: denied Sedative/hypnotics: denied Hallucinogens: denied DT: denied Treatment: Detox: denied Residential: denied    Past Medical History: Dx:  has a past medical history of Abscess, Anemia, iron  deficiency, Depression, Fibroid, Gestational diabetes, Hidradenitis, History of gestational diabetes (03/27/2019), Obesity, Prediabetes (01/02/2023), Pregnancy induced hypertension, and Trauma in childhood.  Allergies: Patient has no known allergies. Seasonal  Head trauma: denied Seizures: denied   Family Psychiatric History:  Suicide: maternal cousin completed suicide when she was 26yo Homicide: denied Psych hospitalization: mom BiPD: Denied SCZ/SCzA: denied Substance use: mom with high risk drinking in the past Others: mom and brother with depression and anxiety   Social History:  Housing: her and 4 kid Employment:  Maternal leave July 27 2023 going back to work Full time  Marital Status: single Children:  4x, born - 2009, 2010, 2021, 2025 Support: friend Family:  Mom lives nearby, dependent on her Education: some college for nursing school Plan to go back when baby is older Armed forces operational officer: denied Developmental: denied  Social History   Socioeconomic History   Marital status: Single    Spouse name: Not on file   Number of children: Not on file   Years of education: Not on file   Highest education level: Not on file  Occupational History   Not on file  Tobacco Use   Smoking status: Former    Current packs/day: 0.00    Types: Cigarettes    Quit  date: 09/18/2018    Years since quitting: 5.0   Smokeless tobacco: Never   Tobacco comments:    quit when found out pregnant   Vaping Use   Vaping status: Former   Quit date: 01/13/2020   Substances: Nicotine, Flavoring  Substance and Sexual Activity   Alcohol use: Not Currently    Alcohol/week: 5.0 standard drinks of alcohol    Types: 5 Glasses of wine per week    Comment: -   Drug use: Not Currently    Types: Marijuana    Comment: last use 15 yrs ago   Sexual activity: Not Currently  Other Topics Concern   Not on file  Social History Narrative   Not on file   Social Drivers of Health   Financial Resource Strain: Not on file  Food Insecurity: No Food Insecurity (05/08/2023)   Hunger Vital Sign    Worried About Running Out of Food in the Last Year: Never true    Ran Out of Food in the Last Year: Never true  Transportation Needs: No Transportation Needs (05/08/2023)   PRAPARE - Administrator, Civil Service (Medical): No    Lack of Transportation (Non-Medical): No  Physical Activity: Not on file  Stress: Not on file  Social Connections: Unknown (05/27/2021)   Received from [facility]   Social Network    Social Network: Not on file    Allergies: No Known Allergies  Current  Medications: Current Outpatient Medications  Medication Sig Dispense Refill   FEROSUL 325 (65 Fe) MG tablet Take 325 mg by mouth every other day.     FLUoxetine  (PROZAC ) 10 MG capsule Take 1 capsule (10 mg total) by mouth every morning for 7 days. 7 capsule 0   FLUoxetine  (PROZAC ) 20 MG capsule Take 1 capsule (20 mg total) by mouth every morning. 60 capsule 0   furosemide  (LASIX ) 40 MG tablet Take 1 tablet (40 mg total) by mouth daily for 2 days. 2 tablet 0   ibuprofen  (ADVIL ) 400 MG tablet Take 2 tablets (800 mg total) by mouth every 8 (eight) hours. 60 tablet 0   NIFEdipine  (ADALAT  CC) 30 MG 24 hr tablet Take 1 tablet (30 mg total) by mouth every 12 (twelve) hours. 60 tablet 0    Prenatal 28-0.8 MG TABS Take 1 tablet by mouth daily. 30 tablet 12   No current facility-administered medications for this visit.    ROS: Review of Systems  Objective:  Objective: Psychiatric Specialty Exam: General Appearance: Casual, fairly groomed  Eye Contact:  Good    Speech:  Clear, coherent, normal rate, spontaneous  Volume:  Normal   Mood:  see above  Affect:  Appropriate, congruent, full range  Thought Content: Logical, rumination  ***  Suicidal Thoughts: see subjective  Thought Process:  Coherent, goal-directed, circumstantial ***  Orientation:  A&Ox4   Memory:  Immediate good  Judgment:  Fair   Insight:  Fair***  Concentration:  Attention and concentration good   Recall:  Good  Fund of Knowledge: Good  Language: Good, fluent  Psychomotor Activity: Normal  Akathisia:  NA   AIMS (if indicated): NA   Assets:   {Assets (PAA):22698}  ADL's:  Intact  Cognition: WNL  Sleep: see above  Appetite: see above    Physical Exam   Metabolic Disorder Labs: Lab Results  Component Value Date   HGBA1C 6.0 (H) 11/10/2022   MPG 120 04/22/2019   No results found for: PROLACTIN No results found for: CHOL, TRIG, HDL, CHOLHDL, VLDL, LDLCALC No results found for: TSH  Therapeutic Level Labs: No results found for: LITHIUM No results found for: VALPROATE No results found for: CBMZ  Screenings:  GAD-7    Flowsheet Row Integrated Behavioral Health from [facility]/03/2023 in [facility] Women Initial Prenatal from 11/10/2022 in [facility] for Women Routine Prenatal from 04/21/2019 in Center for [facility] Routine Prenatal from 03/24/2019 in Center for [facility] Initial Prenatal from 11/30/2018 in Center for [facility]  Total GAD-7 Score 16 4 12 11 3    PHQ2-9    Flowsheet Row Integrated Behavioral Health from  06/03/2023 in [facility] for Women Routine Prenatal from 05/07/2023 in [facility] for Women Initial Prenatal from 11/10/2022 in [facility] for Women Office Visit from 06/20/2021 in [facility] Of [place]. [facility] Office Visit from 10/23/2020 in [facility] Of [place]. [facility]  PHQ-2 Total Score 3 4 2 3 1   PHQ-9 Total Score 16 11 8 10 8    Flowsheet Row Admission (Discharged) from 05/08/2023 in [place] 1S [facility]ty Care Admission (Discharged) from 04/16/2023 in [facility] 1S Maternity Assessment Unit Admission (Discharged) from 10/20/2022 in [facility] 1S  Maternity Assessment Unit  C-SSRS RISK CATEGORY No Risk No Risk No Risk    Collaboration of Care:   Patient/Guardian was advised Release of Information must be obtained prior to any record release in order to collaborate their care with an outside provider. Patient/Guardian was advised if they have not already done so to contact the registration department to sign all necessary forms in order for us  to release information regarding their care.   Consent: Patient/Guardian gives verbal consent for treatment and assignment of benefits for services provided during this visit. Patient/Guardian expressed understanding and agreed to proceed.    Marlo Masson, MD 09/18/2023, 7:44 AM

## 2023-09-18 NOTE — Telephone Encounter (Signed)
 Patient did not show up for the appointment.   Contacted and spoke with patient at [phone], who reported that she was at work and would not be able to make it to her appointment. Patient encouraged to call front desk to reschedule appointment when she had the chance to review her availability.  Treva Huyett Carrin Carrero, MD PGY-3, [facility]

## 2024-02-18 ENCOUNTER — Ambulatory Visit (HOSPITAL_COMMUNITY)
